# Patient Record
Sex: Female | Born: 1970 | Race: White | Hispanic: No | Marital: Single | Smoking: Former smoker
Health system: Southern US, Community
[De-identification: ages and names within clinical notes are randomized; demographics above are authoritative.]

## PROBLEM LIST (undated history)

## (undated) DIAGNOSIS — F419 Anxiety disorder, unspecified: Secondary | ICD-10-CM

## (undated) DIAGNOSIS — F329 Major depressive disorder, single episode, unspecified: Secondary | ICD-10-CM

## (undated) DIAGNOSIS — J40 Bronchitis, not specified as acute or chronic: Secondary | ICD-10-CM

## (undated) DIAGNOSIS — J449 Chronic obstructive pulmonary disease, unspecified: Secondary | ICD-10-CM

## (undated) DIAGNOSIS — J45909 Unspecified asthma, uncomplicated: Secondary | ICD-10-CM

## (undated) DIAGNOSIS — F32A Depression, unspecified: Secondary | ICD-10-CM

## (undated) DIAGNOSIS — D649 Anemia, unspecified: Secondary | ICD-10-CM

## (undated) HISTORY — DX: Major depressive disorder, single episode, unspecified: F32.9

## (undated) HISTORY — PX: CHOLECYSTECTOMY: SHX55

## (undated) HISTORY — DX: Anxiety disorder, unspecified: F41.9

## (undated) HISTORY — PX: TUBAL LIGATION: SHX77

## (undated) HISTORY — PX: TONSILLECTOMY: SUR1361

## (undated) HISTORY — DX: Anemia, unspecified: D64.9

## (undated) HISTORY — DX: Depression, unspecified: F32.A

## (undated) HISTORY — DX: Chronic obstructive pulmonary disease, unspecified: J44.9

---

## 2006-09-20 ENCOUNTER — Emergency Department: Payer: Self-pay | Admitting: Emergency Medicine

## 2007-08-28 ENCOUNTER — Emergency Department: Payer: Self-pay | Admitting: Emergency Medicine

## 2007-09-09 ENCOUNTER — Other Ambulatory Visit: Payer: Self-pay

## 2007-09-09 ENCOUNTER — Emergency Department: Payer: Self-pay | Admitting: Emergency Medicine

## 2008-04-13 ENCOUNTER — Emergency Department: Payer: Self-pay | Admitting: Emergency Medicine

## 2008-07-27 ENCOUNTER — Emergency Department: Payer: Self-pay | Admitting: Emergency Medicine

## 2008-08-12 ENCOUNTER — Emergency Department: Payer: Self-pay | Admitting: Unknown Physician Specialty

## 2008-12-12 ENCOUNTER — Emergency Department: Payer: Self-pay | Admitting: Emergency Medicine

## 2009-02-19 ENCOUNTER — Emergency Department: Payer: Self-pay | Admitting: Internal Medicine

## 2009-02-27 ENCOUNTER — Emergency Department: Payer: Self-pay | Admitting: Emergency Medicine

## 2009-06-23 ENCOUNTER — Emergency Department: Payer: Self-pay | Admitting: Emergency Medicine

## 2009-08-14 ENCOUNTER — Emergency Department: Payer: Self-pay | Admitting: Emergency Medicine

## 2010-08-17 ENCOUNTER — Emergency Department: Payer: Self-pay | Admitting: Emergency Medicine

## 2011-01-09 ENCOUNTER — Emergency Department: Payer: Self-pay | Admitting: Emergency Medicine

## 2011-07-28 ENCOUNTER — Emergency Department: Payer: Self-pay | Admitting: Emergency Medicine

## 2011-07-28 LAB — URINALYSIS, COMPLETE
Nitrite: NEGATIVE
Protein: NEGATIVE
Specific Gravity: 1.003 (ref 1.003–1.030)
WBC UR: 1 /HPF (ref 0–5)

## 2011-07-28 LAB — CBC WITH DIFFERENTIAL/PLATELET
Basophil #: 0 10*3/uL (ref 0.0–0.1)
Basophil %: 0.5 %
Eosinophil #: 0.2 10*3/uL (ref 0.0–0.7)
Lymphocyte #: 2.7 10*3/uL (ref 1.0–3.6)
MCHC: 32.8 g/dL (ref 32.0–36.0)
MCV: 92 fL (ref 80–100)
RBC: 4.23 10*6/uL (ref 3.80–5.20)
RDW: 14.7 % — ABNORMAL HIGH (ref 11.5–14.5)
WBC: 8.4 10*3/uL (ref 3.6–11.0)

## 2011-07-28 LAB — PROTIME-INR
INR: 1
Prothrombin Time: 13.2 secs (ref 11.5–14.7)

## 2011-07-28 LAB — BASIC METABOLIC PANEL
Anion Gap: 8 (ref 7–16)
BUN: 13 mg/dL (ref 7–18)
Calcium, Total: 9 mg/dL (ref 8.5–10.1)
Chloride: 105 mmol/L (ref 98–107)
Creatinine: 0.75 mg/dL (ref 0.60–1.30)
EGFR (African American): >60
EGFR (Non-African Amer.): >60

## 2011-12-22 ENCOUNTER — Emergency Department: Payer: Self-pay | Admitting: Emergency Medicine

## 2012-08-31 ENCOUNTER — Ambulatory Visit: Payer: Self-pay

## 2012-09-23 ENCOUNTER — Emergency Department: Payer: Self-pay | Admitting: Emergency Medicine

## 2012-11-16 ENCOUNTER — Ambulatory Visit: Payer: Self-pay

## 2013-06-27 ENCOUNTER — Emergency Department: Payer: Self-pay | Admitting: Emergency Medicine

## 2013-09-24 ENCOUNTER — Emergency Department: Payer: Self-pay | Admitting: Emergency Medicine

## 2014-02-06 ENCOUNTER — Emergency Department: Payer: Self-pay | Admitting: Internal Medicine

## 2014-02-06 LAB — COMPREHENSIVE METABOLIC PANEL
Albumin: 4 g/dL (ref 3.4–5.0)
Alkaline Phosphatase: 81 U/L
Anion Gap: 5 — ABNORMAL LOW (ref 7–16)
BILIRUBIN TOTAL: 0.3 mg/dL (ref 0.2–1.0)
BUN: 6 mg/dL — AB (ref 7–18)
CO2: 26 mmol/L (ref 21–32)
CREATININE: 0.86 mg/dL (ref 0.60–1.30)
Calcium, Total: 8.9 mg/dL (ref 8.5–10.1)
Chloride: 108 mmol/L — ABNORMAL HIGH (ref 98–107)
EGFR (African American): >60
EGFR (Non-African Amer.): >60
Glucose: 101 mg/dL — ABNORMAL HIGH (ref 65–99)
Osmolality: 275 (ref 275–301)
POTASSIUM: 3.5 mmol/L (ref 3.5–5.1)
SGOT(AST): 12 U/L — ABNORMAL LOW (ref 15–37)
SGPT (ALT): 14 U/L
SODIUM: 139 mmol/L (ref 136–145)
Total Protein: 7.5 g/dL (ref 6.4–8.2)

## 2014-02-06 LAB — URINALYSIS, COMPLETE
Bilirubin,UR: NEGATIVE
GLUCOSE, UR: NEGATIVE mg/dL (ref 0–75)
Ketone: NEGATIVE
NITRITE: NEGATIVE
PH: 7 (ref 4.5–8.0)
PROTEIN: NEGATIVE
RBC,UR: 18 /HPF (ref 0–5)
SPECIFIC GRAVITY: 1.019 (ref 1.003–1.030)
Squamous Epithelial: 7

## 2014-02-06 LAB — CBC WITH DIFFERENTIAL/PLATELET
Basophil #: 0.1 10*3/uL (ref 0.0–0.1)
Basophil %: 0.5 %
EOS ABS: 0 10*3/uL (ref 0.0–0.7)
EOS PCT: 0.4 %
HCT: 44.7 % (ref 35.0–47.0)
HGB: 15.1 g/dL (ref 12.0–16.0)
LYMPHS ABS: 2.3 10*3/uL (ref 1.0–3.6)
Lymphocyte %: 19.5 %
MCH: 32.6 pg (ref 26.0–34.0)
MCHC: 33.9 g/dL (ref 32.0–36.0)
MCV: 96 fL (ref 80–100)
MONOS PCT: 5.7 %
Monocyte #: 0.7 x10 3/mm (ref 0.2–0.9)
NEUTROS ABS: 8.9 10*3/uL — AB (ref 1.4–6.5)
Neutrophil %: 73.9 %
Platelet: 228 10*3/uL (ref 150–440)
RBC: 4.65 10*6/uL (ref 3.80–5.20)
RDW: 13.6 % (ref 11.5–14.5)
WBC: 12 10*3/uL — AB (ref 3.6–11.0)

## 2014-02-08 LAB — URINE CULTURE

## 2014-07-05 ENCOUNTER — Encounter (HOSPITAL_COMMUNITY): Payer: Self-pay

## 2014-07-05 ENCOUNTER — Observation Stay (HOSPITAL_COMMUNITY)
Admission: EM | Admit: 2014-07-05 | Discharge: 2014-07-06 | Disposition: A | Discharge status: Home / Self Care | Payer: Self-pay | Attending: Internal Medicine | Admitting: Internal Medicine

## 2014-07-05 ENCOUNTER — Emergency Department (HOSPITAL_COMMUNITY): Payer: Self-pay

## 2014-07-05 DIAGNOSIS — R079 Chest pain, unspecified: Principal | ICD-10-CM | POA: Insufficient documentation

## 2014-07-05 DIAGNOSIS — R7989 Other specified abnormal findings of blood chemistry: Secondary | ICD-10-CM | POA: Insufficient documentation

## 2014-07-05 DIAGNOSIS — F17210 Nicotine dependence, cigarettes, uncomplicated: Secondary | ICD-10-CM | POA: Insufficient documentation

## 2014-07-05 DIAGNOSIS — R61 Generalized hyperhidrosis: Secondary | ICD-10-CM | POA: Insufficient documentation

## 2014-07-05 LAB — CBC WITH DIFFERENTIAL/PLATELET
BASOS ABS: 0 10*3/uL (ref 0.0–0.1)
Basophils Relative: 0 % (ref 0–1)
EOS ABS: 0.1 10*3/uL (ref 0.0–0.7)
EOS PCT: 1 % (ref 0–5)
HCT: 38.5 % (ref 36.0–46.0)
Hemoglobin: 13.2 g/dL (ref 12.0–15.0)
LYMPHS PCT: 31 % (ref 12–46)
Lymphs Abs: 3.2 10*3/uL (ref 0.7–4.0)
MCH: 31.4 pg (ref 26.0–34.0)
MCHC: 34.3 g/dL (ref 30.0–36.0)
MCV: 91.4 fL (ref 78.0–100.0)
Monocytes Absolute: 1 10*3/uL (ref 0.1–1.0)
Monocytes Relative: 9 % (ref 3–12)
Neutro Abs: 6 10*3/uL (ref 1.7–7.7)
Neutrophils Relative %: 59 % (ref 43–77)
Platelets: 211 10*3/uL (ref 150–400)
RBC: 4.21 MIL/uL (ref 3.87–5.11)
RDW: 13.9 % (ref 11.5–15.5)
WBC: 10.2 10*3/uL (ref 4.0–10.5)

## 2014-07-05 LAB — I-STAT TROPONIN, ED: Troponin i, poc: 0 ng/mL (ref 0.00–0.08)

## 2014-07-05 NOTE — ED Provider Notes (Signed)
CSN: [id number]     Arrival date & time 07/05/14  2222 History   First MD Initiated Contact with Patient 07/05/14 2224     No chief complaint on file.  Melissa Dean is a 44 y.o. female who is a smoker who presents to the emergency department complaining of substernal and left-sided chest pain that radiates to her left shoulder blade beginning at 9 PM tonight. She reports gradual onset of pain and then worsened to being 7 out of 10 at its worst. She reports her pain as a pressure and sharp. Patient is unable to identify aggravating or alleviating factors. EMS did 3 rounds of nitroglycerin that decreased her pain to 2/10. The patient reports feeling fatigued. Currently the patient reports her pain is 2 out of 10 and describes it as a dull pain. She reports a similar substernal chest and radiates to her left shoulder. Patient reports she has been very stressed and somewhat anxious recently. She reports her appetite has been decreased and she has not been eating well. The patient denies history of MI. She reports father had an MI at age 53. The patient denies fevers, shortness of breath, palpitations, cough, hemoptysis, recent surgery, acid reflux, abdominal pain, vomiting, leg pain or leg swelling. The patient denies personal or family history of DVTs or PEs. The patient denies personal or family history of blood clotting disorders such as factor V Leiden, protein C or S deficiency. The patient received 325 ASA prior to arrival.   (Consider location/radiation/quality/duration/timing/severity/associated sxs/prior Treatment) HPI  History reviewed. No pertinent past medical history. Past Surgical History  Procedure Laterality Date  . Cholecystectomy    . Cesarean section     History reviewed. No pertinent family history. History  Substance Use Topics  . Smoking status: Current Every Day Smoker -- 0.50 packs/day  . Smokeless tobacco: Not on file  . Alcohol Use: Yes   OB History    No data  available     Review of Systems  Constitutional: Positive for appetite change. Negative for fever and chills.  HENT: Negative for congestion, ear pain and sore throat.   Eyes: Negative for pain and visual disturbance.  Respiratory: Negative for cough, chest tightness, shortness of breath and wheezing.   Cardiovascular: Positive for chest pain. Negative for palpitations and leg swelling.  Gastrointestinal: Negative for nausea, vomiting, abdominal pain and diarrhea.  Genitourinary: Negative for dysuria and hematuria.  Musculoskeletal: Positive for back pain. Negative for neck pain.  Skin: Negative for rash.  Neurological: Negative for weakness, numbness and headaches.  Psychiatric/Behavioral: The patient is nervous/anxious.       Allergies  Review of patient's allergies indicates not on file.  Home Medications   Prior to Admission medications   Not on File   BP 108/62 mmHg  Pulse 67  Resp 17  SpO2 99%  LMP 06/28/2014 Physical Exam  Constitutional: She is oriented to person, place, and time. She appears well-developed and well-nourished. No distress.  Nontoxic appearing. The patient does appear slightly anxious.  HENT:  Head: Normocephalic and atraumatic.  Right Ear: External ear normal.  Mouth/Throat: Oropharynx is clear and moist. No oropharyngeal exudate.  Eyes: Conjunctivae are normal. Pupils are equal, round, and reactive to light. Right eye exhibits no discharge. Left eye exhibits no discharge.  Neck: Neck supple. No JVD present. No tracheal deviation present.  Cardiovascular: Normal rate, regular rhythm, normal heart sounds and intact distal pulses.  Exam reveals no gallop and no friction rub.   No  murmur heard. Bilateral radial, posterior tibialis and dorsalis pedis pulses are intact.   Pulmonary/Chest: Effort normal and breath sounds normal. No respiratory distress. She has no wheezes. She has no rales. She exhibits tenderness.  Left chest tenderness to palpation  that reproduces her chest pain.   Abdominal: Soft. She exhibits no distension. There is no tenderness.  Musculoskeletal: She exhibits no edema or tenderness.  No lower extremity edema or tenderness.  Lymphadenopathy:    She has no cervical adenopathy.  Neurological: She is alert and oriented to person, place, and time. Coordination normal.  Skin: Skin is warm and dry. No rash noted. She is not diaphoretic. No erythema. No pallor.  Psychiatric: She has a normal mood and affect. Her behavior is normal.  Nursing note and vitals reviewed.   ED Course  Procedures (including critical care time) Labs Review Labs Reviewed  BASIC METABOLIC PANEL - Abnormal; Notable for the following:    GFR calc non Af Amer 83 (*)    All other components within normal limits  CBC WITH DIFFERENTIAL/PLATELET  Rosezena SensorI-STAT TROPOININ, ED    Imaging Review Dg Chest 2 View  07/06/2014   CLINICAL DATA:  Central chest pain radiating to back beginning at 9 p.m. this evening. Smoker.  EXAM: CHEST  2 VIEW  COMPARISON:  None.  FINDINGS: Cardiomediastinal silhouette is unremarkable. The lungs are clear without pleural effusions or focal consolidations. Trachea projects midline and there is no pneumothorax. Soft tissue planes and included osseous structures are non-suspicious. Surgical clips in the included right abdomen likely reflect cholecystectomy.  IMPRESSION: Normal chest.   Electronically Signed   By: Awilda Metroourtnay  Bloomer   On: 07/06/2014 00:21     EKG Interpretation None     EKG would not cross over in MUSE. Please see Dr. Darlyn ChamberZackowski's note for his interpretation.   Filed Vitals:   07/05/14 2245 07/05/14 2252 07/05/14 2300  BP: 107/64 107/64 108/62  Pulse: 67 75 67  Resp: 12 20 17   SpO2: 98% 98% 99%     MDM   Meds given in ED:  Medications - No data to display  New Prescriptions   No medications on file    Final diagnoses:  Chest pain, unspecified chest pain type   This is a 44 y.o. female who is a  smoker who presents to the emergency department complaining of substernal and left-sided chest pain that radiates to her left shoulder blade beginning at 9 PM tonight. She reports gradual onset of pain and then worsened to being 7 out of 10 at its worst. She reports her pain as a pressure and sharp. Patient is unable to identify aggravating or alleviating factors. EMS did 3 rounds of nitroglycerin that decreased her pain to 2/10. She had 325 ASA prior to arrival. She denies shortness of breath or palpitations.   Initial troponin is negative. BMP and CBC are unremarkable. Chest x-ray is negative.  I discussed this patient with my attending Dr. Rhunette CroftNanavati who feels like this patient would benefit from admission for ACS rule out. The patient is in agreement with admission. I consulted Dr. Alvester MorinNewton who accepts this patient for admission for ACS rule out.  This patient was discussed with and evaluated by Dr. Rhunette CroftNanavati who agrees with assessment and plan.     Everlene FarrierWilliam Francely Craw, PA-C 07/06/14 [id number]  Derwood KaplanAnkit Nanavati, MD 07/06/14 [id number]

## 2014-07-05 NOTE — ED Provider Notes (Signed)
ED ECG REPORT   Date: 07/05/2014  Rate: 63  Rhythm: normal sinus rhythm  QRS Axis: normal  Intervals: normal  ST/T Wave abnormalities: ST depressions inferiorly  Conduction Disutrbances:none  Narrative Interpretation:   Old EKG Reviewed: none available  I have personally reviewed the EKG tracing and agree with the computerized printout as noted.  EKG did not crossover into Muse.  Vanetta MuldersScott Nisa Decaire, MD 07/05/14 2242

## 2014-07-05 NOTE — ED Notes (Signed)
Per EMS: Pt began experiencing chest pain ~9pm with radiation to back. EMS gave 3 Nitro en route, pt reports having taken 325 ASA. 20G RFA. Smokes 1/5 PPD. Pain @2 /10 upon arrival.

## 2014-07-06 DIAGNOSIS — R079 Chest pain, unspecified: Secondary | ICD-10-CM | POA: Diagnosis present

## 2014-07-06 DIAGNOSIS — Z720 Tobacco use: Secondary | ICD-10-CM

## 2014-07-06 LAB — TSH: TSH: 5.577 u[IU]/mL — ABNORMAL HIGH (ref 0.350–4.500)

## 2014-07-06 LAB — CBC WITH DIFFERENTIAL/PLATELET
Basophils Absolute: 0 10*3/uL (ref 0.0–0.1)
Basophils Relative: 1 % (ref 0–1)
Eosinophils Absolute: 0.1 10*3/uL (ref 0.0–0.7)
Eosinophils Relative: 2 % (ref 0–5)
HCT: 37.7 % (ref 36.0–46.0)
Hemoglobin: 13 g/dL (ref 12.0–15.0)
LYMPHS ABS: 4.1 10*3/uL — AB (ref 0.7–4.0)
LYMPHS PCT: 46 % (ref 12–46)
MCH: 31.6 pg (ref 26.0–34.0)
MCHC: 34.5 g/dL (ref 30.0–36.0)
MCV: 91.5 fL (ref 78.0–100.0)
Monocytes Absolute: 0.8 10*3/uL (ref 0.1–1.0)
Monocytes Relative: 9 % (ref 3–12)
NEUTROS PCT: 42 % — AB (ref 43–77)
Neutro Abs: 3.7 10*3/uL (ref 1.7–7.7)
PLATELETS: 217 10*3/uL (ref 150–400)
RBC: 4.12 MIL/uL (ref 3.87–5.11)
RDW: 13.9 % (ref 11.5–15.5)
WBC: 8.7 10*3/uL (ref 4.0–10.5)

## 2014-07-06 LAB — COMPREHENSIVE METABOLIC PANEL
ALBUMIN: 3.6 g/dL (ref 3.5–5.2)
ALK PHOS: 62 U/L (ref 39–117)
ALT: 10 U/L (ref 0–35)
AST: 16 U/L (ref 0–37)
Anion gap: 8 (ref 5–15)
BUN: 6 mg/dL (ref 6–23)
CO2: 28 mmol/L (ref 19–32)
Calcium: 9 mg/dL (ref 8.4–10.5)
Chloride: 102 mmol/L (ref 96–112)
Creatinine, Ser: 0.64 mg/dL (ref 0.50–1.10)
GFR calc Af Amer: >90 mL/min (ref >90)
GFR calc non Af Amer: >90 mL/min (ref >90)
Glucose, Bld: 95 mg/dL (ref 70–99)
POTASSIUM: 3.4 mmol/L — AB (ref 3.5–5.1)
SODIUM: 138 mmol/L (ref 135–145)
Total Bilirubin: 0.5 mg/dL (ref 0.3–1.2)
Total Protein: 5.9 g/dL — ABNORMAL LOW (ref 6.0–8.3)

## 2014-07-06 LAB — CREATININE, SERUM
Creatinine, Ser: 0.84 mg/dL (ref 0.50–1.10)
GFR, EST NON AFRICAN AMERICAN: 85 mL/min — AB (ref >90)

## 2014-07-06 LAB — BASIC METABOLIC PANEL
ANION GAP: 11 (ref 5–15)
BUN: 7 mg/dL (ref 6–23)
CO2: 24 mmol/L (ref 19–32)
Calcium: 9.4 mg/dL (ref 8.4–10.5)
Chloride: 104 mmol/L (ref 96–112)
Creatinine, Ser: 0.86 mg/dL (ref 0.50–1.10)
GFR calc Af Amer: >90 mL/min (ref >90)
GFR calc non Af Amer: 83 mL/min — ABNORMAL LOW (ref >90)
Glucose, Bld: 98 mg/dL (ref 70–99)
Potassium: 3.9 mmol/L (ref 3.5–5.1)
SODIUM: 139 mmol/L (ref 135–145)

## 2014-07-06 LAB — LIPASE, BLOOD: Lipase: 33 U/L (ref 11–59)

## 2014-07-06 LAB — TROPONIN I
Troponin I: <0.03 ng/mL (ref <0.031)
Troponin I: <0.03 ng/mL (ref <0.031)

## 2014-07-06 LAB — RAPID URINE DRUG SCREEN, HOSP PERFORMED
Amphetamines: NOT DETECTED
BARBITURATES: NOT DETECTED
Benzodiazepines: NOT DETECTED
Cocaine: NOT DETECTED
OPIATES: POSITIVE — AB
Tetrahydrocannabinol: POSITIVE — AB

## 2014-07-06 LAB — CBC
HEMATOCRIT: 38.5 % (ref 36.0–46.0)
Hemoglobin: 13.1 g/dL (ref 12.0–15.0)
MCH: 31.1 pg (ref 26.0–34.0)
MCHC: 34 g/dL (ref 30.0–36.0)
MCV: 91.4 fL (ref 78.0–100.0)
Platelets: 227 10*3/uL (ref 150–400)
RBC: 4.21 MIL/uL (ref 3.87–5.11)
RDW: 13.9 % (ref 11.5–15.5)
WBC: 9.8 10*3/uL (ref 4.0–10.5)

## 2014-07-06 LAB — LIPID PANEL
Cholesterol: 134 mg/dL (ref 0–200)
HDL: 38 mg/dL — AB (ref >39)
LDL CALC: 76 mg/dL (ref 0–99)
Total CHOL/HDL Ratio: 3.5 RATIO
Triglycerides: 102 mg/dL (ref <150)
VLDL: 20 mg/dL (ref 0–40)

## 2014-07-06 LAB — D-DIMER, QUANTITATIVE: D-Dimer, Quant: <0.27 ug/mL-FEU (ref 0.00–0.48)

## 2014-07-06 MED ORDER — ASPIRIN EC 325 MG PO TBEC
325.0000 mg | DELAYED_RELEASE_TABLET | Freq: Every day | ORAL | Status: DC
  Administered 2014-07-06: 325 mg via ORAL
  Filled 2014-07-06: qty 1

## 2014-07-06 MED ORDER — NITROGLYCERIN 0.4 MG SL SUBL
0.4000 mg | SUBLINGUAL_TABLET | SUBLINGUAL | Status: DC | PRN
Start: 2014-07-06 — End: 2014-07-06

## 2014-07-06 MED ORDER — MORPHINE SULFATE 2 MG/ML IJ SOLN
1.0000 mg | INTRAMUSCULAR | Status: DC | PRN
  Administered 2014-07-06 (×2): 2 mg via INTRAVENOUS
  Filled 2014-07-06 (×3): qty 1

## 2014-07-06 MED ORDER — HEPARIN SODIUM (PORCINE) 5000 UNIT/ML IJ SOLN
5000.0000 [IU] | Freq: Three times a day (TID) | INTRAMUSCULAR | Status: DC
  Administered 2014-07-06 (×3): 5000 [IU] via SUBCUTANEOUS
  Filled 2014-07-06 (×5): qty 1

## 2014-07-06 MED ORDER — SODIUM CHLORIDE 0.9 % IJ SOLN
3.0000 mL | Freq: Two times a day (BID) | INTRAMUSCULAR | Status: DC
  Administered 2014-07-06 (×2): 3 mL via INTRAVENOUS

## 2014-07-06 NOTE — Discharge Summary (Signed)
Discharge Summary  Melissa JohnsValerie Dean GEX:[id number] DOB: 28-Jun-1970  PCP: No primary care provider on file.  Admit date: 07/05/2014 Discharge date: 07/06/2014  Time spent: <7530mins  Recommendations for Outpatient Follow-up:  -F/u with [facility],   patient is to go to the wellness center on Monday to follow up on all tests result including thyroid function/echocardiogram result. Patient to also instructed to discuss smoking cessation with community wellness center.   Discharge Diagnoses:  Active Hospital Problems   Diagnosis Date Noted  . Chest pain 07/06/2014    Resolved Hospital Problems   Diagnosis Date Noted Date Resolved  No resolved problems to display.    Discharge Condition: stable  Diet recommendation: heart healthy  There were no vitals filed for this visit.  History of present illness:  This is a 44 y.o. year old female with significant past medical history of tobacco abuse presenting with chest pain. Pt reports sudden onset of central chest pain earlier this evening. Pain 8/10. Consistent. Some radiation to back and L shoulder. Mild assd diaphoresis. Pain occurred at rest. Denies any prior hx/o heart disease or similar sxs in the past. Active smoker. Denies any illicit drug use. No known hx/o medical issues. + family hx/o heart disease in dad w/ mild MI in early 4450s. Called EMS at home. Pain not improved until after 3 sublingual NTG.  Presented to ER afebrile, hemodynamically stable. CBC and CMET WNL. Trop neg x1. EKG w/ ? t wave abnormalities laterally, though formal EKG not available. CXR WNL.   Hospital Course:  Active Problems:   Chest pain  Cheat pain: likely atypical. She does has risk factor including smoking (1pack a day), and premature CAD in father ( who was a smoker). Three set of cardiac enzyme negative. ldl 76. ddimer wnl. Sinus brady heart rate of 59, occasional sinus arrhythmia. No st elevation. Does has mild tender on  palpation to anterior chest wall. Pain subsided at time of discharge. She is discharged home with community wellness center follow up on Monday.  Elevated tsh, free t3/free t4 pending, patient is to follow up result on Monday with community [facility] at [facility]. She is aware , if test showed she has hypothyroidism, she is likely be started on thyroid supplement.  Smoking cessation education, patient is contemplating quit smoking, will discuss with community wellness center and likely will choose nicotine patch.  Urine drug test: positive with THC. Discussed with patient as well.    Procedures:  echo  Consultations:  none  Discharge Exam: BP 98/50 mmHg  Pulse 59  Temp(Src) 98 F (36.7 C) (Oral)  Resp 18  SpO2 97%  LMP 06/28/2014  General: aaox3 Cardiovascular: rrr Respiratory: CTABl Ab: soft/NT/ND, positive BS. Extremity: no edema  Discharge Instructions You were cared for by a hospitalist during your hospital stay. If you have any questions about your discharge medications or the care you received while you were in the hospital after you are discharged, you can call the unit and asked to speak with the hospitalist on call if the hospitalist that took care of you is not available. Once you are discharged, your primary care physician will handle any further medical issues. Please note that NO REFILLS for any discharge medications will be authorized once you are discharged, as it is imperative that you return to your primary care physician (or establish a relationship with a primary care physician if you do not have one) for your aftercare needs so that they can reassess  your need for medications and monitor your lab values.  Discharge Instructions    Diet - low sodium heart healthy    Complete by:  As directed      Increase activity slowly    Complete by:  As directed             Medication List    Notice    You have not been prescribed any medications.     No  Known Allergies     Follow-up Information    Follow up with [facility]     In 1 week.   Contact information:   [address] [place] [address] [phone]       The results of significant diagnostics from this hospitalization (including imaging, microbiology, ancillary and laboratory) are listed below for reference.    Significant Diagnostic Studies: Dg Chest 2 View  07/06/2014   CLINICAL DATA:  Central chest pain radiating to back beginning at 9 p.m. this evening. Smoker.  EXAM: CHEST  2 VIEW  COMPARISON:  None.  FINDINGS: Cardiomediastinal silhouette is unremarkable. The lungs are clear without pleural effusions or focal consolidations. Trachea projects midline and there is no pneumothorax. Soft tissue planes and included osseous structures are non-suspicious. Surgical clips in the included right abdomen likely reflect cholecystectomy.  IMPRESSION: Normal chest.   Electronically Signed   By: Awilda Metro   On: 07/06/2014 00:21    Microbiology: No results found for this or any previous visit (from the past 240 hour(s)).   Labs: Basic Metabolic Panel:  Recent Labs Lab 07/05/14 2314 07/06/14 0207 07/06/14 0422  NA 139  --  138  K 3.9  --  3.4*  CL 104  --  102  CO2 24  --  28  GLUCOSE 98  --  95  BUN 7  --  6  CREATININE 0.86 0.84 0.64  CALCIUM 9.4  --  9.0   Liver Function Tests:  Recent Labs Lab 07/06/14 0422  AST 16  ALT 10  ALKPHOS 62  BILITOT 0.5  PROT 5.9*  ALBUMIN 3.6   No results for input(s): LIPASE, AMYLASE in the last 168 hours. No results for input(s): AMMONIA in the last 168 hours. CBC:  Recent Labs Lab 07/05/14 2314 07/06/14 0207 07/06/14 0422  WBC 10.2 9.8 8.7  NEUTROABS 6.0  --  3.7  HGB 13.2 13.1 13.0  HCT 38.5 38.5 37.7  MCV 91.4 91.4 91.5  PLT 211 227 217   Cardiac Enzymes:  Recent Labs Lab 07/06/14 0207 07/06/14 0726 07/06/14 1325  TROPONINI <0.03 <0.03 <0.03    BNP: BNP (last 3 results) No results for input(s): BNP in the last 8760 hours.  ProBNP (last 3 results) No results for input(s): PROBNP in the last 8760 hours.  CBG: No results for input(s): GLUCAP in the last 168 hours.     SignedAlbertine Grates MD, PhD  [facility] 07/06/2014, 4:25 PM

## 2014-07-06 NOTE — Progress Notes (Signed)
Pt discharged via W/C, condition stable, educated to follow up with community health on Monday, verbalized understanding, accompanied by significant other.

## 2014-07-06 NOTE — Progress Notes (Signed)
  Echocardiogram 2D Echocardiogram has been performed.  Delcie RochENNINGTON, Carlon Chaloux 07/06/2014, 5:36 PM

## 2014-07-06 NOTE — Progress Notes (Signed)
UR completed 

## 2014-07-06 NOTE — H&P (Signed)
Hospitalist Admission History and Physical  Patient name: Melissa Dean Medical record number: [id number] Date of birth: 05/20/1970 Age: 44 y.o. Gender: female  Primary Care Provider: No primary care provider on file.  Chief Complaint: chest pain   History of Present Illness:This is a 44 y.o. year old female with significant past medical history of tobacco abuse presenting with chest pain. Pt reports sudden onset of central chest pain earlier this evening. Pain 8/10. Consistent. Some radiation to back and L shoulder. Mild assd diaphoresis. Pain occurred at rest. Denies any prior hx/o heart disease or similar sxs in the past. Active smoker. Denies any illicit drug use. No known hx/o medical issues. + family hx/o heart disease in dad w/ mild MI in early 7050s. Called EMS at home. Pain not improved until after 3 sublingual NTG.  Presented to ER afebrile, hemodynamically stable. CBC and CMET WNL. Trop neg x1. EKG w/ ? t wave abnormalities laterally, though formal EKG not available. CXR WNL.   HEART Score 3-4   Assessment and Plan: Melissa Dean is a 44 y.o. year old female presenting with chest pain   Active Problems:   Chest pain   1- Chest Pain  -fairly typical sxs in pt some cardiac RFs including tobacco abuse and family history  -trop neg x1 -EKG NSR w/ ? t wave changes laterally-repeat  -cycle CEs -full dose ASA -prn NTG -risk stratification labs -tele bed -follow  FEN/GI: heart healthy diet  Prophylaxis: sub q heparin  Disposition: pending further evaluation  Code Status:Full Code    Patient Active Problem List   Diagnosis Date Noted  . Chest pain 07/06/2014   Past Medical History: History reviewed. No pertinent past medical history.  Past Surgical History: Past Surgical History  Procedure Laterality Date  . Cholecystectomy    . Cesarean section      Social History: History   Social History  . Marital Status: Married    Spouse Name: N/A  . Number of  Children: N/A  . Years of Education: N/A   Social History Main Topics  . Smoking status: Current Every Day Smoker -- 0.50 packs/day  . Smokeless tobacco: Not on file  . Alcohol Use: Yes  . Drug Use: No  . Sexual Activity: Not on file   Other Topics Concern  . None   Social History Narrative  . None    Family History: History reviewed. No pertinent family history.  Allergies: Not on File  Current Facility-Administered Medications  Medication Dose Route Frequency Provider Last Rate Last Dose  . aspirin EC tablet 325 mg  325 mg Oral Daily Floydene FlockSteven J Latanja Lehenbauer, MD      . heparin injection 5,000 Units  5,000 Units Subcutaneous 3 times per day Floydene FlockSteven J Livvy Spilman, MD      . morphine 2 MG/ML injection 1-2 mg  1-2 mg Intravenous Q3H PRN Floydene FlockSteven J Nyna Chilton, MD      . sodium chloride 0.9 % injection 3 mL  3 mL Intravenous Q12H Floydene FlockSteven J Alinna Siple, MD       No current outpatient prescriptions on file.   Review Of Systems: 12 point ROS negative except as noted above in HPI.  Physical Exam: Filed Vitals:   07/05/14 2300  BP: 108/62  Pulse: 67  Resp: 17    General: alert and cooperative HEENT: PERRLA and extra ocular movement intact Heart: S1, S2 normal, no murmur, rub or gallop, regular rate and rhythm Lungs: clear to auscultation, no wheezes or rales and unlabored  breathing Abdomen: abdomen is soft without significant tenderness, masses, organomegaly or guarding Extremities: extremities normal, atraumatic, no cyanosis or edema Skin:no rashes Neurology: normal without focal findings  Labs and Imaging: Lab Results  Component Value Date/Time   NA 139 07/05/2014 11:14 PM   K 3.9 07/05/2014 11:14 PM   CL 104 07/05/2014 11:14 PM   CO2 24 07/05/2014 11:14 PM   BUN 7 07/05/2014 11:14 PM   CREATININE 0.86 07/05/2014 11:14 PM   GLUCOSE 98 07/05/2014 11:14 PM   Lab Results  Component Value Date   WBC 10.2 07/05/2014   HGB 13.2 07/05/2014   HCT 38.5 07/05/2014   MCV 91.4 07/05/2014   PLT  211 07/05/2014    Dg Chest 2 View  07/06/2014   CLINICAL DATA:  Central chest pain radiating to back beginning at 9 p.m. this evening. Smoker.  EXAM: CHEST  2 VIEW  COMPARISON:  None.  FINDINGS: Cardiomediastinal silhouette is unremarkable. The lungs are clear without pleural effusions or focal consolidations. Trachea projects midline and there is no pneumothorax. Soft tissue planes and included osseous structures are non-suspicious. Surgical clips in the included right abdomen likely reflect cholecystectomy.  IMPRESSION: Normal chest.   Electronically Signed   By: Awilda Metro   On: 07/06/2014 00:21           Doree Albee MD  Pager: [phone]

## 2014-07-07 LAB — HEMOGLOBIN A1C
Hgb A1c MFr Bld: 5.3 % (ref 4.8–5.6)
MEAN PLASMA GLUCOSE: 105 mg/dL

## 2014-07-07 LAB — T4, FREE: FREE T4: 1.18 ng/dL (ref 0.80–1.80)

## 2014-07-07 LAB — T3, FREE: T3, Free: 3.8 pg/mL (ref 2.0–4.4)

## 2014-07-18 ENCOUNTER — Ambulatory Visit: Payer: Self-pay | Attending: Internal Medicine | Admitting: Internal Medicine

## 2014-07-18 ENCOUNTER — Encounter: Payer: Self-pay | Admitting: Internal Medicine

## 2014-07-18 ENCOUNTER — Telehealth: Payer: Self-pay | Admitting: Internal Medicine

## 2014-07-18 VITALS — BP 122/80 | HR 85 | Temp 98.2°F | Resp 16 | Wt 126.4 lb

## 2014-07-18 DIAGNOSIS — F411 Generalized anxiety disorder: Secondary | ICD-10-CM | POA: Insufficient documentation

## 2014-07-18 DIAGNOSIS — Z720 Tobacco use: Secondary | ICD-10-CM | POA: Insufficient documentation

## 2014-07-18 DIAGNOSIS — Z139 Encounter for screening, unspecified: Secondary | ICD-10-CM | POA: Insufficient documentation

## 2014-07-18 DIAGNOSIS — Z1231 Encounter for screening mammogram for malignant neoplasm of breast: Secondary | ICD-10-CM

## 2014-07-18 DIAGNOSIS — R0789 Other chest pain: Secondary | ICD-10-CM | POA: Insufficient documentation

## 2014-07-18 DIAGNOSIS — F419 Anxiety disorder, unspecified: Secondary | ICD-10-CM | POA: Insufficient documentation

## 2014-07-18 DIAGNOSIS — E876 Hypokalemia: Secondary | ICD-10-CM

## 2014-07-18 DIAGNOSIS — R7989 Other specified abnormal findings of blood chemistry: Secondary | ICD-10-CM | POA: Insufficient documentation

## 2014-07-18 LAB — COMPLETE METABOLIC PANEL WITH GFR
ALT: <8 U/L (ref 0–35)
AST: 12 U/L (ref 0–37)
Albumin: 4.3 g/dL (ref 3.5–5.2)
Alkaline Phosphatase: 64 U/L (ref 39–117)
BILIRUBIN TOTAL: 0.6 mg/dL (ref 0.2–1.2)
BUN: 5 mg/dL — ABNORMAL LOW (ref 6–23)
CALCIUM: 9.8 mg/dL (ref 8.4–10.5)
CO2: 27 mEq/L (ref 19–32)
CREATININE: 0.81 mg/dL (ref 0.50–1.10)
Chloride: 108 mEq/L (ref 96–112)
GFR, Est African American: >89 mL/min
GFR, Est Non African American: >89 mL/min
Glucose, Bld: 92 mg/dL (ref 70–99)
Potassium: 5.2 mEq/L (ref 3.5–5.3)
Sodium: 142 mEq/L (ref 135–145)
Total Protein: 6.8 g/dL (ref 6.0–8.3)

## 2014-07-18 MED ORDER — CITALOPRAM HYDROBROMIDE 20 MG PO TABS: 20.0000 mg | ORAL_TABLET | Freq: Every day | ORAL | Status: DC

## 2014-07-18 MED ORDER — CITALOPRAM HYDROBROMIDE 10 MG PO TABS: 10.0000 mg | ORAL_TABLET | Freq: Every day | ORAL | Status: DC

## 2014-07-18 MED ORDER — NICOTINE 21 MG/24HR TD PT24: 21.0000 mg | MEDICATED_PATCH | Freq: Every day | TRANSDERMAL | Status: DC

## 2014-07-18 NOTE — Telephone Encounter (Signed)
Patient called stating that she used  To take Celexa 20mg , but was prescribed 10mg  and would like it changed to the mg she was taking before. Please f/u with pt.

## 2014-07-18 NOTE — Telephone Encounter (Signed)
Celexa 20 mg sent to the pharmacy

## 2014-07-18 NOTE — Progress Notes (Signed)
Patient Demographics  Melissa Dean, is a 44 y.o. female  WUJ:[id number]  NFA:[id number]  DOB - 1970/07/10  CC:  Chief Complaint  Patient presents with  . Establish Care       HPI: Melissa Dean is a 44 y.o. female here today to establish medical care.Patient has history of tobacco abuse, recently hospitalized with symptoms of chest pain, EMR reviewed patient had a cardiac workup done and it was thought to be chest pain likely atypical, her blood work reviewed patient had borderline abnormal TSH level, her free T4 and free T4 level is normal, patient also had echocardiogram done which reported EF of 60-65%, blood chemistry showed borderline low potassium level, she also reports history of anxiety, used to be on Celexa, recently she has been having lot of stress and anxiety symptoms, she is requesting prescription for this medication, denies any SI or HI. Patient also wants to quit smoking and is requesting prescription for nicotine patch. Patient has No headache, No chest pain, No abdominal pain - No Nausea, No new weakness tingling or numbness, No Cough - SOB.  No Known Allergies History reviewed. No pertinent past medical history. No current outpatient prescriptions on file prior to visit.   No current facility-administered medications on file prior to visit.   Family History  Problem Relation Age of Onset  . Heart disease Father   . Hypertension Father   . Diabetes Father   . Cancer Father     lung cancer  . Diabetes Brother   . Hypertension Paternal Grandfather   . Diabetes Paternal Grandfather   . Thyroid disease     History   Social History  . Marital Status: Married    Spouse Name: N/A  . Number of Children: N/A  . Years of Education: N/A   Occupational History  . Not on file.   Social History Main Topics  . Smoking status: Current Every Day Smoker -- 1.00 packs/day for 20 years  . Smokeless tobacco: Not on file  . Alcohol Use: 0.0 oz/week    0  Standard drinks or equivalent per week  . Drug Use: Yes  . Sexual Activity: Not on file   Other Topics Concern  . Not on file   Social History Narrative    Review of Systems: Constitutional: Negative for fever, chills, diaphoresis, activity change, appetite change and fatigue. HENT: Negative for ear pain, nosebleeds, congestion, facial swelling, rhinorrhea, neck pain, neck stiffness and ear discharge.  Eyes: Negative for pain, discharge, redness, itching and visual disturbance. Respiratory: Negative for cough, choking, chest tightness, shortness of breath, wheezing and stridor.  Cardiovascular: Negative for chest pain, palpitations and leg swelling. Gastrointestinal: Negative for abdominal distention. Genitourinary: Negative for dysuria, urgency, frequency, hematuria, flank pain, decreased urine volume, difficulty urinating and dyspareunia.  Musculoskeletal: Negative for back pain, joint swelling, arthralgia and gait problem. Neurological: Negative for dizziness, tremors, seizures, syncope, facial asymmetry, speech difficulty, weakness, light-headedness, numbness and headaches.  Hematological: Negative for adenopathy. Does not bruise/bleed easily. Psychiatric/Behavioral: Negative for hallucinations, behavioral problems, confusion, dysphoric mood, decreased concentration and agitation.    Objective:   Filed Vitals:   07/18/14 0916  BP: 122/80  Pulse: 85  Temp: 98.2 F (36.8 C)  Resp: 16    Physical Exam: Constitutional: Patient appears well-developed and well-nourished. No distress. HENT: Normocephalic, atraumatic, External right and left ear normal. Oropharynx is clear and moist.  Eyes: Conjunctivae and EOM are normal. PERRLA, no scleral icterus. Neck: Normal ROM. Neck supple.  No JVD. No tracheal deviation. No thyromegaly. CVS: RRR, S1/S2 +, no murmurs, no gallops, no carotid bruit.  Pulmonary: Effort and breath sounds normal, no stridor, rhonchi, wheezes, rales.  Abdominal:  Soft. BS +, no distension, tenderness, rebound or guarding.  Musculoskeletal: Normal range of motion. No edema and no tenderness.  Neuro: Alert. Normal reflexes, muscle tone coordination. No cranial nerve deficit. Skin: Skin is warm and dry. No rash noted. Not diaphoretic. No erythema. No pallor. Psychiatric: Normal mood and affect. Behavior, judgment, thought content normal.  Lab Results  Component Value Date   WBC 8.7 07/06/2014   HGB 13.0 07/06/2014   HCT 37.7 07/06/2014   MCV 91.5 07/06/2014   PLT 217 07/06/2014   Lab Results  Component Value Date   CREATININE 0.64 07/06/2014   BUN 6 07/06/2014   NA 138 07/06/2014   K 3.4* 07/06/2014   CL 102 07/06/2014   CO2 28 07/06/2014    Lab Results  Component Value Date/Time   HGBA1C 5.3 07/06/2014 02:07 AM   Lipid Panel     Component Value Date/Time   CHOL 134 07/06/2014 0207   TRIG 102 07/06/2014 0207   HDL 38* 07/06/2014 0207   CHOLHDL 3.5 07/06/2014 0207   VLDL 20 07/06/2014 0207   LDLCALC 76 07/06/2014 0207       Assessment and plan:   1. Atypical chest pain Patient symptomatically improved denies any chest pain.  2. Abnormal TSH Free T3-T4 level is normal, will repeat TSH level on the following visit.  3. Tobacco abuse Patient is given prescription for nicotine patch.  4. Hypokalemia Will repeat blood chemistry - COMPLETE METABOLIC PANEL WITH GFR  5. Anxiety state Resume back on Celexa.  6. Encounter for screening mammogram for breast cancer  - MM DIGITAL SCREENING BILATERAL; Future  7. Screening  - Vit D  25 hydroxy (rtn osteoporosis monitoring)     Health Maintenance  -Pap Smear: as per patient she had a Pap smear done within the last year and time. -Mammogram:ordered   Return in about 3 months (around 10/17/2014), or if symptoms worsen or fail to improve.    The patient was given clear instructions to go to ER or return to medical center if symptoms don't improve, worsen or new problems  develop. The patient verbalized understanding. The patient was told to call to get lab results if they haven't heard anything in the next week.    This note has been created with Education officer, environmentalDragon speech recognition software and smart phrase technology. Any transcriptional errors are unintentional.   Doris CheadleADVANI, Colinda Barth, MD

## 2014-07-18 NOTE — Progress Notes (Signed)
Patient here to establish care Recently admitted to [facility] for chest pains Patient states she was told her thyroid numbers were high Patient currently takes no prescribed medications

## 2014-07-18 NOTE — Patient Instructions (Signed)
Smoking Cessation Quitting smoking is important to your health and has many advantages. However, it is not always easy to quit since nicotine is a very addictive drug. Oftentimes, people try 3 times or more before being able to quit. This document explains the best ways for you to prepare to quit smoking. Quitting takes hard work and a lot of effort, but you can do it. ADVANTAGES OF QUITTING SMOKING  You will live longer, feel better, and live better.  Your body will feel the impact of quitting smoking almost immediately.  Within 20 minutes, blood pressure decreases. Your pulse returns to its normal level.  After 8 hours, carbon monoxide levels in the blood return to normal. Your oxygen level increases.  After 24 hours, the chance of having a heart attack starts to decrease. Your breath, hair, and body stop smelling like smoke.  After 48 hours, damaged nerve endings begin to recover. Your sense of taste and smell improve.  After 72 hours, the body is virtually free of nicotine. Your bronchial tubes relax and breathing becomes easier.  After 2 to 12 weeks, lungs can hold more air. Exercise becomes easier and circulation improves.  The risk of having a heart attack, stroke, cancer, or lung disease is greatly reduced.  After 1 year, the risk of coronary heart disease is cut in half.  After 5 years, the risk of stroke falls to the same as a nonsmoker.  After 10 years, the risk of lung cancer is cut in half and the risk of other cancers decreases significantly.  After 15 years, the risk of coronary heart disease drops, usually to the level of a nonsmoker.  If you are pregnant, quitting smoking will improve your chances of having a healthy baby.  The people you live with, especially any children, will be healthier.  You will have extra money to spend on things other than cigarettes. QUESTIONS TO THINK ABOUT BEFORE ATTEMPTING TO QUIT You may want to talk about your answers with your  health care provider.  Why do you want to quit?  If you tried to quit in the past, what helped and what did not?  What will be the most difficult situations for you after you quit? How will you plan to handle them?  Who can help you through the tough times? Your family? Friends? A health care provider?  What pleasures do you get from smoking? What ways can you still get pleasure if you quit? Here are some questions to ask your health care provider:  How can you help me to be successful at quitting?  What medicine do you think would be best for me and how should I take it?  What should I do if I need more help?  What is smoking withdrawal like? How can I get information on withdrawal? GET READY  Set a quit date.  Change your environment by getting rid of all cigarettes, ashtrays, matches, and lighters in your home, car, or work. Do not let people smoke in your home.  Review your past attempts to quit. Think about what worked and what did not. GET SUPPORT AND ENCOURAGEMENT You have a better chance of being successful if you have help. You can get support in many ways.  Tell your family, friends, and coworkers that you are going to quit and need their support. Ask them not to smoke around you.  Get individual, group, or telephone counseling and support. Programs are available at local hospitals and health centers. Call   your local health department for information about programs in your area.  Spiritual beliefs and practices may help some smokers quit.  Download a "quit meter" on your computer to keep track of quit statistics, such as how long you have gone without smoking, cigarettes not smoked, and money saved.  Get a self-help book about quitting smoking and staying off tobacco. LEARN NEW SKILLS AND BEHAVIORS  Distract yourself from urges to smoke. Talk to someone, go for a walk, or occupy your time with a task.  Change your normal routine. Take a different route to work.  Drink tea instead of coffee. Eat breakfast in a different place.  Reduce your stress. Take a hot bath, exercise, or read a book.  Plan something enjoyable to do every day. Reward yourself for not smoking.  Explore interactive web-based programs that specialize in helping you quit. GET MEDICINE AND USE IT CORRECTLY Medicines can help you stop smoking and decrease the urge to smoke. Combining medicine with the above behavioral methods and support can greatly increase your chances of successfully quitting smoking.  Nicotine replacement therapy helps deliver nicotine to your body without the negative effects and risks of smoking. Nicotine replacement therapy includes nicotine gum, lozenges, inhalers, nasal sprays, and skin patches. Some may be available over-the-counter and others require a prescription.  Antidepressant medicine helps people abstain from smoking, but how this works is unknown. This medicine is available by prescription.  Nicotinic receptor partial agonist medicine simulates the effect of nicotine in your brain. This medicine is available by prescription. Ask your health care provider for advice about which medicines to use and how to use them based on your health history. Your health care provider will tell you what side effects to look out for if you choose to be on a medicine or therapy. Carefully read the information on the package. Do not use any other product containing nicotine while using a nicotine replacement product.  RELAPSE OR DIFFICULT SITUATIONS Most relapses occur within the first 3 months after quitting. Do not be discouraged if you start smoking again. Remember, most people try several times before finally quitting. You may have symptoms of withdrawal because your body is used to nicotine. You may crave cigarettes, be irritable, feel very hungry, cough often, get headaches, or have difficulty concentrating. The withdrawal symptoms are only temporary. They are strongest  when you first quit, but they will go away within 10-14 days. To reduce the chances of relapse, try to:  Avoid drinking alcohol. Drinking lowers your chances of successfully quitting.  Reduce the amount of caffeine you consume. Once you quit smoking, the amount of caffeine in your body increases and can give you symptoms, such as a rapid heartbeat, sweating, and anxiety.  Avoid smokers because they can make you want to smoke.  Do not let weight gain distract you. Many smokers will gain weight when they quit, usually less than 10 pounds. Eat a healthy diet and stay active. You can always lose the weight gained after you quit.  Find ways to improve your mood other than smoking. FOR MORE INFORMATION  www.smokefree.gov  Document Released: 03/17/2001 Document Revised: 08/07/2013 Document Reviewed: 07/02/2011 Exit[employer] Patient Information 2015 [employer]. This information is not intended to replace advice given to you by your health care provider. Make sure you discuss any questions you have with your health care provider.  

## 2014-07-19 ENCOUNTER — Telehealth: Payer: Self-pay

## 2014-07-19 LAB — VITAMIN D 25 HYDROXY (VIT D DEFICIENCY, FRACTURES): Vit D, 25-Hydroxy: 26 ng/mL — ABNORMAL LOW (ref 30–100)

## 2014-07-19 NOTE — Telephone Encounter (Signed)
Patient is aware of her lab results 

## 2014-07-19 NOTE — Telephone Encounter (Signed)
-----   Message from Doris Cheadleeepak Advani, MD sent at 07/19/2014  9:22 AM EDT ----- Call and let the patient know that her potassium level is in normal range.  noticed low vitamin D, advise patient to start taking OTC 2000 units daily.

## 2016-01-16 ENCOUNTER — Emergency Department: Payer: Self-pay

## 2016-01-16 ENCOUNTER — Encounter: Payer: Self-pay | Admitting: Emergency Medicine

## 2016-01-16 ENCOUNTER — Emergency Department
Admission: EM | Admit: 2016-01-16 | Discharge: 2016-01-16 | Disposition: A | Discharge status: Home / Self Care | Payer: Self-pay | Attending: Emergency Medicine | Admitting: Emergency Medicine

## 2016-01-16 ENCOUNTER — Other Ambulatory Visit: Payer: Self-pay

## 2016-01-16 DIAGNOSIS — F17200 Nicotine dependence, unspecified, uncomplicated: Secondary | ICD-10-CM | POA: Insufficient documentation

## 2016-01-16 DIAGNOSIS — J40 Bronchitis, not specified as acute or chronic: Secondary | ICD-10-CM | POA: Insufficient documentation

## 2016-01-16 LAB — CBC
HEMATOCRIT: 43.4 % (ref 35.0–47.0)
HEMOGLOBIN: 14.8 g/dL (ref 12.0–16.0)
MCH: 31.2 pg (ref 26.0–34.0)
MCHC: 34.2 g/dL (ref 32.0–36.0)
MCV: 91.2 fL (ref 80.0–100.0)
Platelets: 220 10*3/uL (ref 150–440)
RBC: 4.75 MIL/uL (ref 3.80–5.20)
RDW: 14.2 % (ref 11.5–14.5)
WBC: 12.1 10*3/uL — ABNORMAL HIGH (ref 3.6–11.0)

## 2016-01-16 LAB — BASIC METABOLIC PANEL
Anion gap: 6 (ref 5–15)
BUN: 12 mg/dL (ref 6–20)
CO2: 24 mmol/L (ref 22–32)
Calcium: 9.5 mg/dL (ref 8.9–10.3)
Chloride: 107 mmol/L (ref 101–111)
Creatinine, Ser: 0.7 mg/dL (ref 0.44–1.00)
GFR calc Af Amer: >60 mL/min (ref >60)
GLUCOSE: 105 mg/dL — AB (ref 65–99)
POTASSIUM: 4.1 mmol/L (ref 3.5–5.1)
Sodium: 137 mmol/L (ref 135–145)

## 2016-01-16 LAB — TROPONIN I: Troponin I: <0.03 ng/mL (ref <0.03)

## 2016-01-16 MED ORDER — PREDNISONE 20 MG PO TABS
60.0000 mg | ORAL_TABLET | Freq: Once | ORAL | Status: AC
  Administered 2016-01-16: 60 mg via ORAL
  Filled 2016-01-16: qty 3

## 2016-01-16 MED ORDER — LEVOFLOXACIN 500 MG PO TABS: 500.0000 mg | ORAL_TABLET | Freq: Every day | ORAL | 0 refills | Status: AC

## 2016-01-16 MED ORDER — ALBUTEROL SULFATE HFA 108 (90 BASE) MCG/ACT IN AERS: 2.0000 | INHALATION_SPRAY | Freq: Four times a day (QID) | RESPIRATORY_TRACT | 2 refills | Status: DC | PRN

## 2016-01-16 MED ORDER — IPRATROPIUM-ALBUTEROL 0.5-2.5 (3) MG/3ML IN SOLN
3.0000 mL | Freq: Once | RESPIRATORY_TRACT | Status: AC
  Administered 2016-01-16: 3 mL via RESPIRATORY_TRACT
  Filled 2016-01-16: qty 3

## 2016-01-16 MED ORDER — LEVOFLOXACIN 500 MG PO TABS
500.0000 mg | ORAL_TABLET | Freq: Once | ORAL | Status: AC
  Administered 2016-01-16: 500 mg via ORAL
  Filled 2016-01-16: qty 1

## 2016-01-16 MED ORDER — PREDNISONE 10 MG (21) PO TBPK: ORAL_TABLET | ORAL | 0 refills | Status: DC

## 2016-01-16 NOTE — ED Triage Notes (Signed)
Says sob for 3 weeks--especially at night when she lays down.  Has been trying mucinex, dayquil, nyquil with no relief.

## 2016-01-16 NOTE — ED Provider Notes (Signed)
[facility] Emergency Department Provider Note        Time seen: ----------------------------------------- 8:56 AM on 01/16/2016 -----------------------------------------    I have reviewed the triage vital signs and the nursing notes.   HISTORY  Chief Complaint Shortness of Breath    HPI Melissa Dean is a 45 y.o. female who presents to ER for shortness of breath last 3 weeks. Patient states her symptoms are especially worse at night when she lays down. She's been trying over-the-counter medications without any improvement.Patient denies fevers, has had cough and persistent shortness of breath. Nothing has made her symptoms better.   History reviewed. No pertinent past medical history.  Patient Active Problem List   Diagnosis Date Noted  . Anxiety state 07/18/2014  . Tobacco abuse 07/18/2014  . Abnormal TSH 07/18/2014  . Atypical chest pain 07/18/2014  . Chest pain 07/06/2014    Past Surgical History:  Procedure Laterality Date  . CESAREAN SECTION    . CESAREAN SECTION    . CHOLECYSTECTOMY    . TONSILLECTOMY    . TUBAL LIGATION      Allergies Review of patient's allergies indicates no known allergies.  Social History Social History  Substance Use Topics  . Smoking status: Current Every Day Smoker    Packs/day: 1.00    Years: 20.00  . Smokeless tobacco: Never Used  . Alcohol use No    Review of Systems Constitutional: Negative for fever. Cardiovascular: Negative for chest pain. Respiratory: Positive for shortness of breath and cough Gastrointestinal: Negative for abdominal pain, vomiting and diarrhea. Genitourinary: Negative for dysuria. Musculoskeletal: Negative for back pain. Skin: Negative for rash. Neurological: Negative for headaches, focal weakness or numbness.  10-point ROS otherwise negative.  ____________________________________________   PHYSICAL EXAM:  VITAL SIGNS: ED Triage Vitals  Enc Vitals Group      BP 01/16/16 0832 122/67     Pulse Rate 01/16/16 0832 78     Resp 01/16/16 0832 (!) 22     Temp 01/16/16 0832 97.7 F (36.5 C)     Temp Source 01/16/16 0832 Oral     SpO2 01/16/16 0832 93 %     Weight 01/16/16 0833 125 lb (56.7 kg)     Height 01/16/16 0833 5\' 2"  (1.575 m)     Head Circumference --      Peak Flow --      Pain Score 01/16/16 0844 4     Pain Loc --      Pain Edu? --      Excl. in GC? --     Constitutional: Alert and oriented. Well appearing and in no distress. Eyes: Conjunctivae are normal. PERRL. Normal extraocular movements. ENT   Head: Normocephalic and atraumatic.   Nose: No congestion/rhinnorhea.   Mouth/Throat: Mucous membranes are moist.   Neck: No stridor. Cardiovascular: Normal rate, regular rhythm. No murmurs, rubs, or gallops. Respiratory: Normal respiratory effort without tachypnea nor retractions. Bilateral wheezing with occasional rhonchi Gastrointestinal: Soft and nontender. Normal bowel sounds Musculoskeletal: Nontender with normal range of motion in all extremities. No lower extremity tenderness nor edema. Neurologic:  Normal speech and language. No gross focal neurologic deficits are appreciated.  Skin:  Skin is warm, dry and intact. No rash noted. Psychiatric: Mood and affect are normal. Speech and behavior are normal.  ____________________________________________  EKG: Interpreted by me. Sinus rhythm rate of 75 bpm, normal PR interval, normal QRS size, normal QT, possible septal infarct.  ____________________________________________  ED COURSE:  Pertinent labs & imaging  results that were available during my care of the patient were reviewed by me and considered in my medical decision making (see chart for details). Clinical Course  Patients in no distress, she will receive breathing treatments, clinically likely with bronchitis.  Procedures ____________________________________________   LABS (pertinent  positives/negatives)  Labs Reviewed  BASIC METABOLIC PANEL - Abnormal; Notable for the following:       Result Value   Glucose, Bld 105 (*)    All other components within normal limits  CBC - Abnormal; Notable for the following:    WBC 12.1 (*)    All other components within normal limits  TROPONIN I    RADIOLOGY Images were viewed by me  Chest x-ray Is unremarkable ____________________________________________  FINAL ASSESSMENT AND PLAN  Bronchitis  Plan: Patient with labs and imaging as dictated above. Patient presents with subacute bronchitis. With 3 weeks of symptoms, I will start her on Levaquin. She has improved with DuoNeb since steroids. She'll be discharged with albuterol and a steroid taper as well. She is stable for outpatient follow-up with her doctor.   Emily Filbert, MD   Note: This dictation was prepared with Dragon dictation. Any transcriptional errors that result from this process are unintentional    Emily Filbert, MD 01/16/16 [phone]

## 2016-04-28 ENCOUNTER — Emergency Department
Admission: EM | Admit: 2016-04-28 | Discharge: 2016-04-29 | Disposition: A | Discharge status: Home / Self Care | Payer: Self-pay | Attending: Emergency Medicine | Admitting: Emergency Medicine

## 2016-04-28 ENCOUNTER — Emergency Department: Payer: Self-pay

## 2016-04-28 DIAGNOSIS — J40 Bronchitis, not specified as acute or chronic: Secondary | ICD-10-CM | POA: Insufficient documentation

## 2016-04-28 DIAGNOSIS — Z79899 Other long term (current) drug therapy: Secondary | ICD-10-CM | POA: Insufficient documentation

## 2016-04-28 DIAGNOSIS — F17200 Nicotine dependence, unspecified, uncomplicated: Secondary | ICD-10-CM | POA: Insufficient documentation

## 2016-04-28 LAB — CBC
HCT: 43 % (ref 35.0–47.0)
HEMOGLOBIN: 15 g/dL (ref 12.0–16.0)
MCH: 31.3 pg (ref 26.0–34.0)
MCHC: 34.9 g/dL (ref 32.0–36.0)
MCV: 89.8 fL (ref 80.0–100.0)
Platelets: 247 10*3/uL (ref 150–440)
RBC: 4.79 MIL/uL (ref 3.80–5.20)
RDW: 13.4 % (ref 11.5–14.5)
WBC: 14.2 10*3/uL — ABNORMAL HIGH (ref 3.6–11.0)

## 2016-04-28 LAB — TROPONIN I: Troponin I: <0.03 ng/mL (ref <0.03)

## 2016-04-28 LAB — BASIC METABOLIC PANEL
ANION GAP: 8 (ref 5–15)
BUN: 12 mg/dL (ref 6–20)
CHLORIDE: 106 mmol/L (ref 101–111)
CO2: 26 mmol/L (ref 22–32)
CREATININE: 0.66 mg/dL (ref 0.44–1.00)
Calcium: 9.4 mg/dL (ref 8.9–10.3)
GFR calc non Af Amer: >60 mL/min (ref >60)
GLUCOSE: 111 mg/dL — AB (ref 65–99)
Potassium: 4.1 mmol/L (ref 3.5–5.1)
Sodium: 140 mmol/L (ref 135–145)

## 2016-04-28 MED ORDER — AZITHROMYCIN 250 MG PO TABS: ORAL_TABLET | ORAL | 0 refills | Status: AC

## 2016-04-28 MED ORDER — IPRATROPIUM-ALBUTEROL 0.5-2.5 (3) MG/3ML IN SOLN
9.0000 mL | Freq: Once | RESPIRATORY_TRACT | Status: AC
  Administered 2016-04-28: 9 mL via RESPIRATORY_TRACT
  Filled 2016-04-28: qty 9

## 2016-04-28 MED ORDER — ALBUTEROL SULFATE HFA 108 (90 BASE) MCG/ACT IN AERS: 2.0000 | INHALATION_SPRAY | Freq: Four times a day (QID) | RESPIRATORY_TRACT | 0 refills | Status: DC | PRN

## 2016-04-28 MED ORDER — KETOROLAC TROMETHAMINE 60 MG/2ML IM SOLN
30.0000 mg | Freq: Once | INTRAMUSCULAR | Status: AC
  Administered 2016-04-28: 30 mg via INTRAMUSCULAR
  Filled 2016-04-28: qty 2

## 2016-04-28 MED ORDER — PREDNISONE 20 MG PO TABS
60.0000 mg | ORAL_TABLET | Freq: Once | ORAL | Status: AC
  Administered 2016-04-28: 60 mg via ORAL
  Filled 2016-04-28: qty 3

## 2016-04-28 MED ORDER — AZITHROMYCIN 500 MG PO TABS
500.0000 mg | ORAL_TABLET | Freq: Once | ORAL | Status: AC
  Administered 2016-04-28: 500 mg via ORAL
  Filled 2016-04-28: qty 1

## 2016-04-28 MED ORDER — ALBUTEROL SULFATE (2.5 MG/3ML) 0.083% IN NEBU
INHALATION_SOLUTION | RESPIRATORY_TRACT | Status: AC
  Administered 2016-04-28: 5 mg via RESPIRATORY_TRACT
  Filled 2016-04-28: qty 3

## 2016-04-28 MED ORDER — ALBUTEROL SULFATE (2.5 MG/3ML) 0.083% IN NEBU
5.0000 mg | INHALATION_SOLUTION | Freq: Once | RESPIRATORY_TRACT | Status: AC
  Administered 2016-04-28: 5 mg via RESPIRATORY_TRACT
  Filled 2016-04-28: qty 6

## 2016-04-28 MED ORDER — PREDNISONE 50 MG PO TABS: ORAL_TABLET | ORAL | 0 refills | Status: DC

## 2016-04-28 NOTE — ED Provider Notes (Signed)
[facility] Emergency Department Provider Note  ____________________________________________   First MD Initiated Contact with Patient 04/28/16 2138     (approximate)  I have reviewed the triage vital signs and the nursing notes.   HISTORY  Chief Complaint Shortness of Breath; Chest Pain; and Cough   HPI Melissa Dean is a 46 y.o. female with a history of bronchitis who is a smoker who is presenting to the emergency department today with 1 week of cough with chest tightness and chest pain which is sharp with coughing. Says the pain is also in her back and worse with coughing. She denies any fever. No known sick contacts. Says that she has had steroids as well as bronchodilators in the past for similar problems.   History reviewed. No pertinent past medical history.  Patient Active Problem List   Diagnosis Date Noted  . Anxiety state 07/18/2014  . Tobacco abuse 07/18/2014  . Abnormal TSH 07/18/2014  . Atypical chest pain 07/18/2014  . Chest pain 07/06/2014    Past Surgical History:  Procedure Laterality Date  . CESAREAN SECTION    . CESAREAN SECTION    . CHOLECYSTECTOMY    . TONSILLECTOMY    . TUBAL LIGATION      Prior to Admission medications   Medication Sig Start Date End Date Taking? Authorizing Provider  albuterol (PROVENTIL HFA;VENTOLIN HFA) 108 (90 Base) MCG/ACT inhaler Inhale 2 puffs into the lungs every 6 (six) hours as needed for wheezing or shortness of breath. 01/16/16   Emily Filbert, MD  citalopram (CELEXA) 20 MG tablet Take 1 tablet (20 mg total) by mouth daily. 07/18/14   Doris Cheadle, MD  nicotine (NICODERM CQ) 21 mg/24hr patch Place 1 patch (21 mg total) onto the skin daily. Patient not taking: Reported on 01/16/2016 07/18/14   Doris Cheadle, MD  predniSONE (STERAPRED UNI-PAK 21 TAB) 10 MG (21) TBPK tablet Dispense steroid taper pack and instructed 01/16/16   Emily Filbert, MD    Allergies Patient has no known  allergies.  Family History  Problem Relation Age of Onset  . Heart disease Father   . Hypertension Father   . Diabetes Father   . Cancer Father     lung cancer  . Diabetes Brother   . Hypertension Paternal Grandfather   . Diabetes Paternal Grandfather   . Thyroid disease      Social History Social History  Substance Use Topics  . Smoking status: Current Every Day Smoker    Packs/day: 1.00    Years: 20.00  . Smokeless tobacco: Never Used  . Alcohol use No    Review of Systems Constitutional: No fever/chills Eyes: No visual changes. ENT: No sore throat. Cardiovascular: As above Respiratory: As above Gastrointestinal: No abdominal pain.  No nausea, no vomiting.  No diarrhea.  No constipation. Genitourinary: Negative for dysuria. Musculoskeletal: Negative for back pain. Skin: Negative for rash. Neurological: Negative for headaches, focal weakness or numbness.  10-point ROS otherwise negative.  ____________________________________________   PHYSICAL EXAM:  VITAL SIGNS: ED Triage Vitals  Enc Vitals Group     BP 04/28/16 2026 125/75     Pulse Rate 04/28/16 2026 89     Resp 04/28/16 2026 18     Temp 04/28/16 2026 97.8 F (36.6 C)     Temp Source 04/28/16 2026 Oral     SpO2 04/28/16 2026 94 %     Weight 04/28/16 2027 125 lb (56.7 kg)     Height 04/28/16 2027  5\' 2"  (1.575 m)     Head Circumference --      Peak Flow --      Pain Score 04/28/16 2027 8     Pain Loc --      Pain Edu? --      Excl. in GC? --     Constitutional: Alert and oriented. Well appearing and in no acute distress. Eyes: Conjunctivae are normal. PERRL. EOMI. Head: Atraumatic. Nose: No congestion/rhinnorhea. Mouth/Throat: Mucous membranes are moist.   Neck: No stridor.   Cardiovascular: Normal rate, regular rhythm. Grossly normal heart sounds.   Respiratory: Normal respiratory effort.  No retractions. Speaks in full sentences. Wheezing throughout with expiratory cough. Gastrointestinal:  Soft and nontender. No distention.  Musculoskeletal: No lower extremity tenderness nor edema.  No joint effusions. Neurologic:  Normal speech and language. No gross focal neurologic deficits are appreciated.  Skin:  Skin is warm, dry and intact. No rash noted. Psychiatric: Mood and affect are normal. Speech and behavior are normal.  ____________________________________________   LABS (all labs ordered are listed, but only abnormal results are displayed)  Labs Reviewed  BASIC METABOLIC PANEL - Abnormal; Notable for the following:       Result Value   Glucose, Bld 111 (*)    All other components within normal limits  CBC - Abnormal; Notable for the following:    WBC 14.2 (*)    All other components within normal limits  TROPONIN I   ____________________________________________  EKG  ED ECG REPORT I, Arelia Longest, the attending physician, personally viewed and interpreted this ECG.   Date: 04/28/2016  EKG Time: 2101  Rate: 98  Rhythm: normal sinus rhythm  Axis: Normal axis  Intervals:none  ST&T Change: No ST segment elevation or depression. No abnormal T-wave inversion.  ____________________________________________  RADIOLOGY  DG Chest 2 View (Final result)  Result time 04/28/16 21:03:41  Final result by Kennith Center, MD (04/28/16 21:03:41)           Narrative:   CLINICAL DATA: Shortness of breath with cough and chest pain for 1 week.  EXAM: CHEST 2 VIEW  COMPARISON: 01/16/2016  FINDINGS: The heart size and mediastinal contours are within normal limits. Both lungs are clear. The visualized skeletal structures are unremarkable.  IMPRESSION: No active cardiopulmonary disease.   Electronically Signed By: Kennith Center M.D. On: 04/28/2016 21:03            ____________________________________________   PROCEDURES  Procedure(s) performed:   Procedures  Critical Care performed:    ____________________________________________   INITIAL IMPRESSION / ASSESSMENT AND PLAN / ED COURSE  Pertinent labs & imaging results that were available during my care of the patient were reviewed by me and considered in my medical decision making (see chart for details).  ----------------------------------------- 11:14 PM on 04/28/2016 -----------------------------------------  Patient reevaluated and still with scant extra wheezing. Says that she is still having chest pain with coughing. No tenderness palpation on the chest wall. She says the pain is worsened especially with coughing. PERC negative. Reassuring EKG and troponin after week of symptoms. Believe this likely chest wall pain. Signed out to Dr. Manson Passey for reevaluation.      ____________________________________________   FINAL CLINICAL IMPRESSION(S) / ED DIAGNOSES  Bronchitis.      NEW MEDICATIONS STARTED DURING THIS VISIT:  New Prescriptions   No medications on file     Note:  This document was prepared using Dragon voice recognition software and may include unintentional dictation errors.  Myrna Blazeravid Matthew Bailey Faiella, MD 04/28/16 [phone]

## 2016-04-28 NOTE — ED Triage Notes (Signed)
Pt presents to ED with c/o SHOB, cough, and chest pain x1 week. Pt reports chest congestion with productive cough (clear sputum), shortness of breath, and chest pain "from the coughing". Pt (+) smoker, tried OTC Mucinex without relief. Pt reports (+)m flu "week before last", but denies any c/o N/V/D or fever at this time.

## 2016-04-29 MED ORDER — IBUPROFEN 800 MG PO TABS
800.0000 mg | ORAL_TABLET | Freq: Once | ORAL | Status: AC
  Administered 2016-04-29: 800 mg via ORAL
  Filled 2016-04-29: qty 1

## 2016-05-03 ENCOUNTER — Emergency Department
Admission: EM | Admit: 2016-05-03 | Discharge: 2016-05-03 | Disposition: A | Discharge status: Home / Self Care | Payer: Self-pay | Attending: Emergency Medicine | Admitting: Emergency Medicine

## 2016-05-03 ENCOUNTER — Encounter: Payer: Self-pay | Admitting: Emergency Medicine

## 2016-05-03 ENCOUNTER — Emergency Department: Payer: Self-pay

## 2016-05-03 DIAGNOSIS — F17200 Nicotine dependence, unspecified, uncomplicated: Secondary | ICD-10-CM | POA: Insufficient documentation

## 2016-05-03 DIAGNOSIS — J069 Acute upper respiratory infection, unspecified: Secondary | ICD-10-CM | POA: Insufficient documentation

## 2016-05-03 DIAGNOSIS — Z79899 Other long term (current) drug therapy: Secondary | ICD-10-CM | POA: Insufficient documentation

## 2016-05-03 LAB — BASIC METABOLIC PANEL
Anion gap: 5 (ref 5–15)
BUN: 7 mg/dL (ref 6–20)
CHLORIDE: 109 mmol/L (ref 101–111)
CO2: 27 mmol/L (ref 22–32)
CREATININE: 0.74 mg/dL (ref 0.44–1.00)
Calcium: 8.4 mg/dL — ABNORMAL LOW (ref 8.9–10.3)
GFR calc Af Amer: >60 mL/min (ref >60)
GFR calc non Af Amer: >60 mL/min (ref >60)
GLUCOSE: 96 mg/dL (ref 65–99)
POTASSIUM: 3.7 mmol/L (ref 3.5–5.1)
SODIUM: 141 mmol/L (ref 135–145)

## 2016-05-03 LAB — CBC
HCT: 42.5 % (ref 35.0–47.0)
Hemoglobin: 14.3 g/dL (ref 12.0–16.0)
MCH: 31 pg (ref 26.0–34.0)
MCHC: 33.6 g/dL (ref 32.0–36.0)
MCV: 92.2 fL (ref 80.0–100.0)
PLATELETS: 206 10*3/uL (ref 150–440)
RBC: 4.61 MIL/uL (ref 3.80–5.20)
RDW: 13.6 % (ref 11.5–14.5)
WBC: 9.7 10*3/uL (ref 3.6–11.0)

## 2016-05-03 LAB — INFLUENZA PANEL BY PCR (TYPE A & B)
Influenza A By PCR: NEGATIVE
Influenza B By PCR: NEGATIVE

## 2016-05-03 LAB — TROPONIN I: Troponin I: <0.03 ng/mL (ref <0.03)

## 2016-05-03 MED ORDER — IPRATROPIUM-ALBUTEROL 0.5-2.5 (3) MG/3ML IN SOLN
RESPIRATORY_TRACT | Status: AC
  Filled 2016-05-03: qty 9

## 2016-05-03 MED ORDER — ALBUTEROL SULFATE (2.5 MG/3ML) 0.083% IN NEBU
5.0000 mg | INHALATION_SOLUTION | Freq: Once | RESPIRATORY_TRACT | Status: AC
  Administered 2016-05-03: 5 mg via RESPIRATORY_TRACT

## 2016-05-03 MED ORDER — ALBUTEROL SULFATE (2.5 MG/3ML) 0.083% IN NEBU
INHALATION_SOLUTION | RESPIRATORY_TRACT | Status: AC
  Filled 2016-05-03: qty 9

## 2016-05-03 MED ORDER — GUAIFENESIN-CODEINE 100-10 MG/5ML PO SOLN: 5.0000 mL | Freq: Four times a day (QID) | ORAL | 0 refills | Status: DC | PRN

## 2016-05-03 MED ORDER — HYDROCOD POLST-CPM POLST ER 10-8 MG/5ML PO SUER
5.0000 mL | Freq: Once | ORAL | Status: AC
  Administered 2016-05-03: 5 mL via ORAL
  Filled 2016-05-03: qty 5

## 2016-05-03 NOTE — ED Notes (Signed)
FIRST NURSE NOTE: Shortness of breath worse since this morning. Pt reports she was here earlier this week. Unable to finish whole sentence without running out of breath. Retraction

## 2016-05-03 NOTE — ED Provider Notes (Signed)
[facility] Emergency Department Provider Note  Time seen: 1:34 PM  I have reviewed the triage vital signs and the nursing notes.   HISTORY  Chief Complaint Respiratory Distress    HPI Melissa Dean is a 46 y.o. female with a past medical history of anxiety, chest discomfort, presents to the emergency department with symptoms of chest pain, cough, congestion and difficulty breathing. According to the patient she was seen in the emergency department 5 days ago for similar symptoms. At that time she received some breathing treatments was discharged with a Z-Pak and prednisone and albuterol. Patient states she can only afford the Z-Pak she is completing tomorrow. Patient states she continued to feel very short of breath today so she return to the emergency department for evaluation. Patient continues to state some discomfort in her back at only when coughing. Denies any pleuritic chest pain. Denies any leg pain or swelling. Patient denies any known fever.  History reviewed. No pertinent past medical history.  Patient Active Problem List   Diagnosis Date Noted  . Anxiety state 07/18/2014  . Tobacco abuse 07/18/2014  . Abnormal TSH 07/18/2014  . Atypical chest pain 07/18/2014  . Chest pain 07/06/2014    Past Surgical History:  Procedure Laterality Date  . CESAREAN SECTION    . CESAREAN SECTION    . CHOLECYSTECTOMY    . TONSILLECTOMY    . TUBAL LIGATION      Prior to Admission medications   Medication Sig Start Date End Date Taking? Authorizing Provider  albuterol (PROVENTIL HFA;VENTOLIN HFA) 108 (90 Base) MCG/ACT inhaler Inhale 2 puffs into the lungs every 6 (six) hours as needed for wheezing or shortness of breath. 04/28/16   Myrna Blazeravid Matthew Schaevitz, MD  azithromycin (ZITHROMAX Z-PAK) 250 MG tablet Take 2 tablets (500 mg) on  Day 1,  followed by 1 tablet (250 mg) once daily on Days 2 through 5. 04/28/16 05/03/16  Myrna Blazeravid Matthew Schaevitz, MD  citalopram  (CELEXA) 20 MG tablet Take 1 tablet (20 mg total) by mouth daily. 07/18/14   Doris Cheadleeepak Advani, MD  nicotine (NICODERM CQ) 21 mg/24hr patch Place 1 patch (21 mg total) onto the skin daily. Patient not taking: Reported on 01/16/2016 07/18/14   Doris Cheadleeepak Advani, MD  predniSONE (DELTASONE) 50 MG tablet Take 1 tab po daily x5 days 04/28/16   Myrna Blazeravid Matthew Schaevitz, MD    No Known Allergies  Family History  Problem Relation Age of Onset  . Heart disease Father   . Hypertension Father   . Diabetes Father   . Cancer Father     lung cancer  . Diabetes Brother   . Hypertension Paternal Grandfather   . Diabetes Paternal Grandfather   . Thyroid disease      Social History Social History  Substance Use Topics  . Smoking status: Current Every Day Smoker    Packs/day: 1.00    Years: 20.00  . Smokeless tobacco: Never Used  . Alcohol use No    Review of Systems Constitutional: Negative for fever. Cardiovascular: Negative for chest pain. Respiratory: Positive for shortness of breath. Cough and congestion. Gastrointestinal: Negative for abdominal pain Neurological: Negative for headache 10-point ROS otherwise negative.  ____________________________________________   PHYSICAL EXAM:  VITAL SIGNS: ED Triage Vitals  Enc Vitals Group     BP 05/03/16 1226 119/84     Pulse Rate 05/03/16 1226 78     Resp 05/03/16 1226 (!) 28     Temp 05/03/16 1226 98.1 F (36.7 C)  Temp Source 05/03/16 1226 Oral     SpO2 05/03/16 1226 99 %     Weight --      Height --      Head Circumference --      Peak Flow --      Pain Score 05/03/16 1220 8     Pain Loc --      Pain Edu? --      Excl. in GC? --     Constitutional: Alert and oriented. Well appearing and in no distress. Eyes: Normal exam ENT   Head: Normocephalic and atraumatic   Mouth/Throat: Mucous membranes are moist. Cardiovascular: Normal rate, regular rhythm. No murmur Respiratory: Normal respiratory effort without tachypnea nor  retractions. Breath sounds are clear  Gastrointestinal: Soft and nontender. No distention Musculoskeletal: Nontender with normal range of motion in all extremities.  Neurologic:  Normal speech and language. No gross focal neurologic deficits  Skin:  Skin is warm, dry and intact.  Psychiatric: Mood and affect are normal.   ____________________________________________    EKG  EKG reviewed and interpreted by myself shows sinus rhythm at 78 bpm, narrow QRS, normal axis, no concerning ST changes. Larson normal intervals.  ____________________________________________    RADIOLOGY  Chest x-ray is negative  ____________________________________________   INITIAL IMPRESSION / ASSESSMENT AND PLAN / ED COURSE  Pertinent labs & imaging results that were available during my care of the patient were reviewed by me and considered in my medical decision making (see chart for details).  The patient presents the emergency department with shortness of breath, cough and congestion. Patient has received a breathing treatment in the emergency department upon my examination the patient has clear lung sounds bilaterally, no distress, normal vitals with a 96-98 percent room air saturation, normal heart rate, afebrile. As patient is complaining of back discomfort in her upper back with coughing we'll obtain labs including a troponin. We will obtain a repeat chest x-ray as well as an influenza swab.   The patient's labs are resulted largely within normal limits. Influenza negative. Chest x-ray negative. Patient has completed a course of Zithromax. Likely bronchitis. We will discharge with cough medication. ____________________________________________   FINAL CLINICAL IMPRESSION(S) / ED DIAGNOSES  Dyspnea URI    Minna Antis, MD 05/03/16 1440

## 2016-05-04 ENCOUNTER — Encounter: Payer: Self-pay | Admitting: *Deleted

## 2016-05-04 ENCOUNTER — Ambulatory Visit: Payer: Self-pay | Attending: Oncology | Admitting: *Deleted

## 2016-05-04 VITALS — BP 103/62 | HR 63 | Temp 98.1°F | Ht 62.6 in | Wt 124.7 lb

## 2016-05-04 DIAGNOSIS — N63 Unspecified lump in unspecified breast: Secondary | ICD-10-CM

## 2016-05-04 NOTE — Progress Notes (Signed)
Subjective:     Patient ID: Melissa Dean, female   DOB: 1970/10/21, 46 y.o.   MRN: [id number]  HPI   Review of Systems     Objective:   Physical Exam  Pulmonary/Chest: Right breast exhibits mass. Right breast exhibits no inverted nipple, no nipple discharge, no skin change and no tenderness. Left breast exhibits no inverted nipple, no mass, no nipple discharge, no skin change and no tenderness. Breasts are symmetrical.         Assessment:     46 year old White [facility]  States she found a lump in her left breast a few weeks ago.  States she was a "little more concerned since it is not painful".  Family history of lung cancer in her dad.  No history of breast or ovarian cancer.  States she does not know her mom or her mothers side of the family.  On clinical breast exam I can see and palpate a small approximate 0.5 cm hard, mobile nodule at 7:00 left breast at the edge of the inframmary ridge.  Taught self breast awareness.  Patient has been screened for eligibility.  She does not have any insurance, Medicare or Medicaid.  She also meets financial eligibility.  Hand-out given on the Affordable Care Act.    Plan:     Will get bilateral diagnostic mammogram and ultrasound of the left breast.  Will follow-up per [facility] protocol.

## 2016-05-04 NOTE — Patient Instructions (Signed)
Gave patient hand-out, Women Staying Healthy, Active and Well from [facility], with education on breast health, pap smears, heart and colon health. 

## 2016-05-07 ENCOUNTER — Other Ambulatory Visit: Payer: Self-pay

## 2016-05-07 ENCOUNTER — Ambulatory Visit: Payer: Self-pay

## 2016-05-14 ENCOUNTER — Ambulatory Visit
Admission: RE | Admit: 2016-05-14 | Discharge: 2016-05-14 | Disposition: A | Discharge status: Home / Self Care | Payer: Self-pay | Source: Ambulatory Visit | Attending: Oncology | Admitting: Oncology

## 2016-05-14 DIAGNOSIS — N63 Unspecified lump in unspecified breast: Secondary | ICD-10-CM

## 2016-05-15 ENCOUNTER — Other Ambulatory Visit: Payer: Self-pay | Admitting: *Deleted

## 2016-05-15 DIAGNOSIS — N6489 Other specified disorders of breast: Secondary | ICD-10-CM

## 2016-05-15 DIAGNOSIS — N63 Unspecified lump in unspecified breast: Secondary | ICD-10-CM

## 2016-05-24 HISTORY — PX: BREAST BIOPSY: SHX20

## 2016-05-26 ENCOUNTER — Ambulatory Visit
Admission: RE | Admit: 2016-05-26 | Discharge: 2016-05-26 | Disposition: A | Discharge status: Home / Self Care | Payer: Self-pay | Source: Ambulatory Visit | Attending: Oncology | Admitting: Oncology

## 2016-05-26 DIAGNOSIS — N6489 Other specified disorders of breast: Secondary | ICD-10-CM

## 2016-05-27 LAB — SURGICAL PATHOLOGY

## 2016-06-04 ENCOUNTER — Other Ambulatory Visit: Payer: Self-pay | Admitting: *Deleted

## 2016-06-04 DIAGNOSIS — R920 Mammographic microcalcification found on diagnostic imaging of breast: Secondary | ICD-10-CM

## 2016-06-12 ENCOUNTER — Encounter: Payer: Self-pay | Admitting: *Deleted

## 2016-06-12 NOTE — Progress Notes (Signed)
Letter mailed to inform patient of her 6 month follow-up mammogram on 11/26/16 @ 2:00.  [facility]

## 2016-07-04 ENCOUNTER — Emergency Department: Payer: Self-pay

## 2016-07-04 ENCOUNTER — Encounter: Payer: Self-pay | Admitting: Emergency Medicine

## 2016-07-04 ENCOUNTER — Inpatient Hospital Stay
Admission: EM | Admit: 2016-07-04 | Discharge: 2016-07-05 | DRG: 202 | Disposition: A | Discharge status: Home / Self Care | Payer: Self-pay | Attending: Internal Medicine | Admitting: Internal Medicine

## 2016-07-04 DIAGNOSIS — R0789 Other chest pain: Secondary | ICD-10-CM | POA: Diagnosis present

## 2016-07-04 DIAGNOSIS — J209 Acute bronchitis, unspecified: Principal | ICD-10-CM

## 2016-07-04 DIAGNOSIS — I959 Hypotension, unspecified: Secondary | ICD-10-CM | POA: Diagnosis present

## 2016-07-04 DIAGNOSIS — Z833 Family history of diabetes mellitus: Secondary | ICD-10-CM

## 2016-07-04 DIAGNOSIS — F17200 Nicotine dependence, unspecified, uncomplicated: Secondary | ICD-10-CM | POA: Diagnosis present

## 2016-07-04 DIAGNOSIS — J9601 Acute respiratory failure with hypoxia: Secondary | ICD-10-CM

## 2016-07-04 DIAGNOSIS — R0602 Shortness of breath: Secondary | ICD-10-CM

## 2016-07-04 DIAGNOSIS — Z801 Family history of malignant neoplasm of trachea, bronchus and lung: Secondary | ICD-10-CM

## 2016-07-04 DIAGNOSIS — E860 Dehydration: Secondary | ICD-10-CM | POA: Diagnosis present

## 2016-07-04 DIAGNOSIS — R0902 Hypoxemia: Secondary | ICD-10-CM

## 2016-07-04 DIAGNOSIS — Z8249 Family history of ischemic heart disease and other diseases of the circulatory system: Secondary | ICD-10-CM

## 2016-07-04 DIAGNOSIS — Z716 Tobacco abuse counseling: Secondary | ICD-10-CM

## 2016-07-04 DIAGNOSIS — M549 Dorsalgia, unspecified: Secondary | ICD-10-CM | POA: Diagnosis present

## 2016-07-04 DIAGNOSIS — Z79899 Other long term (current) drug therapy: Secondary | ICD-10-CM

## 2016-07-04 DIAGNOSIS — Z9049 Acquired absence of other specified parts of digestive tract: Secondary | ICD-10-CM

## 2016-07-04 DIAGNOSIS — J440 Chronic obstructive pulmonary disease with acute lower respiratory infection: Secondary | ICD-10-CM | POA: Diagnosis present

## 2016-07-04 DIAGNOSIS — J449 Chronic obstructive pulmonary disease, unspecified: Secondary | ICD-10-CM

## 2016-07-04 DIAGNOSIS — J441 Chronic obstructive pulmonary disease with (acute) exacerbation: Secondary | ICD-10-CM | POA: Diagnosis present

## 2016-07-04 LAB — BASIC METABOLIC PANEL
Anion gap: 6 (ref 5–15)
BUN: 9 mg/dL (ref 6–20)
CALCIUM: 9.1 mg/dL (ref 8.9–10.3)
CO2: 26 mmol/L (ref 22–32)
CREATININE: 0.78 mg/dL (ref 0.44–1.00)
Chloride: 107 mmol/L (ref 101–111)
GFR calc non Af Amer: >60 mL/min (ref >60)
Glucose, Bld: 99 mg/dL (ref 65–99)
Potassium: 3.8 mmol/L (ref 3.5–5.1)
SODIUM: 139 mmol/L (ref 135–145)

## 2016-07-04 LAB — CBC
HCT: 44.6 % (ref 35.0–47.0)
Hemoglobin: 15.2 g/dL (ref 12.0–16.0)
MCH: 31.8 pg (ref 26.0–34.0)
MCHC: 33.9 g/dL (ref 32.0–36.0)
MCV: 93.7 fL (ref 80.0–100.0)
PLATELETS: 191 10*3/uL (ref 150–440)
RBC: 4.76 MIL/uL (ref 3.80–5.20)
RDW: 14.4 % (ref 11.5–14.5)
WBC: 7.8 10*3/uL (ref 3.6–11.0)

## 2016-07-04 LAB — TROPONIN I

## 2016-07-04 MED ORDER — ALBUTEROL SULFATE (2.5 MG/3ML) 0.083% IN NEBU
2.5000 mg | INHALATION_SOLUTION | RESPIRATORY_TRACT | Status: DC
  Administered 2016-07-04 (×2): 2.5 mg via RESPIRATORY_TRACT
  Filled 2016-07-04 (×2): qty 3

## 2016-07-04 MED ORDER — ALBUTEROL (5 MG/ML) CONTINUOUS INHALATION SOLN
5.0000 mg/h | INHALATION_SOLUTION | Freq: Once | RESPIRATORY_TRACT | Status: DC
  Filled 2016-07-04: qty 20

## 2016-07-04 MED ORDER — GUAIFENESIN ER 600 MG PO TB12
600.0000 mg | ORAL_TABLET | Freq: Two times a day (BID) | ORAL | Status: DC
  Administered 2016-07-04 – 2016-07-05 (×3): 600 mg via ORAL
  Filled 2016-07-04 (×3): qty 1

## 2016-07-04 MED ORDER — METHYLPREDNISOLONE SODIUM SUCC 125 MG IJ SOLR
125.0000 mg | Freq: Once | INTRAMUSCULAR | Status: AC
  Administered 2016-07-04: 125 mg via INTRAVENOUS
  Filled 2016-07-04: qty 2

## 2016-07-04 MED ORDER — SODIUM CHLORIDE 0.9% FLUSH
3.0000 mL | Freq: Two times a day (BID) | INTRAVENOUS | Status: DC
  Administered 2016-07-04 (×2): 3 mL via INTRAVENOUS

## 2016-07-04 MED ORDER — BUDESONIDE 0.25 MG/2ML IN SUSP
0.2500 mg | Freq: Two times a day (BID) | RESPIRATORY_TRACT | Status: DC
  Administered 2016-07-04 – 2016-07-05 (×3): 0.25 mg via RESPIRATORY_TRACT
  Filled 2016-07-04 (×3): qty 2

## 2016-07-04 MED ORDER — ENOXAPARIN SODIUM 40 MG/0.4ML ~~LOC~~ SOLN
40.0000 mg | SUBCUTANEOUS | Status: DC
  Administered 2016-07-04: 40 mg via SUBCUTANEOUS

## 2016-07-04 MED ORDER — DIPHENHYDRAMINE HCL 25 MG PO CAPS: 50.0000 mg | ORAL_CAPSULE | Freq: Four times a day (QID) | ORAL | Status: DC | PRN

## 2016-07-04 MED ORDER — CODEINE SULFATE 30 MG PO TABS
15.0000 mg | ORAL_TABLET | Freq: Four times a day (QID) | ORAL | Status: DC | PRN
  Administered 2016-07-04: 15 mg via ORAL
  Filled 2016-07-04 (×2): qty 1

## 2016-07-04 MED ORDER — IPRATROPIUM-ALBUTEROL 0.5-2.5 (3) MG/3ML IN SOLN
3.0000 mL | Freq: Once | RESPIRATORY_TRACT | Status: AC
  Administered 2016-07-04: 3 mL via RESPIRATORY_TRACT

## 2016-07-04 MED ORDER — POTASSIUM CHLORIDE IN NACL 20-0.9 MEQ/L-% IV SOLN
INTRAVENOUS | Status: DC
  Administered 2016-07-04 – 2016-07-05 (×2): via INTRAVENOUS
  Filled 2016-07-04 (×3): qty 1000

## 2016-07-04 MED ORDER — ONDANSETRON HCL 4 MG/2ML IJ SOLN: 4.0000 mg | Freq: Four times a day (QID) | INTRAMUSCULAR | Status: DC | PRN

## 2016-07-04 MED ORDER — IPRATROPIUM-ALBUTEROL 0.5-2.5 (3) MG/3ML IN SOLN
RESPIRATORY_TRACT | Status: AC
  Filled 2016-07-04: qty 9

## 2016-07-04 MED ORDER — ALBUTEROL SULFATE (2.5 MG/3ML) 0.083% IN NEBU
INHALATION_SOLUTION | RESPIRATORY_TRACT | Status: AC
  Administered 2016-07-04: 5 mg
  Filled 2016-07-04: qty 6

## 2016-07-04 MED ORDER — SODIUM CHLORIDE 0.9% FLUSH: 3.0000 mL | INTRAVENOUS | Status: DC | PRN

## 2016-07-04 MED ORDER — ONDANSETRON HCL 4 MG PO TABS: 4.0000 mg | ORAL_TABLET | Freq: Four times a day (QID) | ORAL | Status: DC | PRN

## 2016-07-04 MED ORDER — DEXTROSE 5 % IV SOLN
500.0000 mg | Freq: Every day | INTRAVENOUS | Status: DC
  Administered 2016-07-04: 500 mg via INTRAVENOUS
  Filled 2016-07-04 (×2): qty 500

## 2016-07-04 MED ORDER — IPRATROPIUM BROMIDE 0.02 % IN SOLN
0.5000 mg | RESPIRATORY_TRACT | Status: DC
  Administered 2016-07-04 (×2): 0.5 mg via RESPIRATORY_TRACT
  Filled 2016-07-04 (×2): qty 2.5

## 2016-07-04 MED ORDER — METHYLPREDNISOLONE SODIUM SUCC 125 MG IJ SOLR
60.0000 mg | Freq: Four times a day (QID) | INTRAMUSCULAR | Status: DC
  Administered 2016-07-04 – 2016-07-05 (×4): 60 mg via INTRAVENOUS
  Filled 2016-07-04 (×2): qty 2

## 2016-07-04 MED ORDER — SODIUM CHLORIDE 0.9 % IV SOLN: 250.0000 mL | INTRAVENOUS | Status: DC | PRN

## 2016-07-04 MED ORDER — GUAIFENESIN-CODEINE 100-10 MG/5ML PO SOLN
5.0000 mL | Freq: Four times a day (QID) | ORAL | Status: DC | PRN
  Administered 2016-07-04 – 2016-07-05 (×3): 5 mL via ORAL
  Filled 2016-07-04 (×3): qty 5

## 2016-07-04 MED ORDER — LIDOCAINE-PRILOCAINE 2.5-2.5 % EX CREA
TOPICAL_CREAM | CUTANEOUS | Status: DC | PRN
  Filled 2016-07-04: qty 5

## 2016-07-04 MED ORDER — NICOTINE 21 MG/24HR TD PT24
21.0000 mg | MEDICATED_PATCH | Freq: Every day | TRANSDERMAL | Status: DC
  Administered 2016-07-04 – 2016-07-05 (×2): 21 mg via TRANSDERMAL
  Filled 2016-07-04 (×2): qty 1

## 2016-07-04 MED ORDER — IPRATROPIUM-ALBUTEROL 0.5-2.5 (3) MG/3ML IN SOLN
3.0000 mL | RESPIRATORY_TRACT | Status: DC
  Administered 2016-07-04 – 2016-07-05 (×5): 3 mL via RESPIRATORY_TRACT
  Filled 2016-07-04 (×5): qty 3

## 2016-07-04 NOTE — Progress Notes (Signed)
Patient c/o back pain. Spoke to Dr. Seth Bake and she will put orders in.

## 2016-07-04 NOTE — ED Triage Notes (Signed)
Patient to ER for c/o shortness of breath. States she has been having issues since October, been diagnosed with bronchitis twice. Patient reports getting cat in September. Patient visibly short of breath upon entering lobby (refused wheelchair). Patient able to answer questions without difficulty, but again noticeably short of breath. Used inhaler at home prior to arrival. Patient does not have nebs at home. Patient also reports non-productive cough.

## 2016-07-04 NOTE — H&P (Signed)
[facility] [place] at [facility]   PATIENT NAME: Melissa Dean    MR#:  [id number]  DATE OF BIRTH:  Jul 05, 1970  DATE OF ADMISSION:  07/04/2016  PRIMARY CARE PHYSICIAN: No PCP Per Patient   REQUESTING/REFERRING PHYSICIAN:   CHIEF COMPLAINT:   Chief Complaint  Patient presents with  . Shortness of Breath    HISTORY OF PRESENT ILLNESS: Melissa Dean  is a 46 y.o. female with , Wheezing, tightness in the lungs, shortness of breath. She was seen in emergency room at least twice and was given antibiotics and steroids, however, did not improve significantly and in fact worsened over the past couple of days, she admits of a coughing paroxysm and would become very short of breath even nausea with dry heaving. She presented to the hospital and her O2 sats were found to be low, 86% on room air. Hospitalist services were contacted for admission. The patient complains of back pain, chest pain with cough  PAST MEDICAL HISTORY:  History reviewed. No pertinent past medical history.  PAST SURGICAL HISTORY: Past Surgical History:  Procedure Laterality Date  . CESAREAN SECTION    . CESAREAN SECTION    . CHOLECYSTECTOMY    . TONSILLECTOMY    . TUBAL LIGATION      SOCIAL HISTORY:  Social History  Substance Use Topics  . Smoking status: Current Every Day Smoker    Packs/day: 0.50    Years: 20.00  . Smokeless tobacco: Never Used  . Alcohol use No    FAMILY HISTORY:  Family History  Problem Relation Age of Onset  . Heart disease Father   . Hypertension Father   . Diabetes Father   . Cancer Father     lung cancer  . Diabetes Brother   . Hypertension Paternal Grandfather   . Diabetes Paternal Grandfather   . Thyroid disease      DRUG ALLERGIES: No Known Allergies  Review of Systems  Constitutional: Negative for chills, fever and weight loss.  HENT: Negative for congestion.   Eyes: Negative for blurred vision and double vision.  Respiratory:  Positive for cough, shortness of breath and wheezing. Negative for sputum production.   Cardiovascular: Positive for chest pain and palpitations. Negative for orthopnea, leg swelling and PND.  Gastrointestinal: Positive for nausea. Negative for abdominal pain, blood in stool, constipation, diarrhea and vomiting.  Genitourinary: Negative for dysuria, frequency, hematuria and urgency.  Musculoskeletal: Positive for back pain. Negative for falls.  Neurological: Negative for dizziness, tremors, focal weakness and headaches.  Endo/Heme/Allergies: Does not bruise/bleed easily.  Psychiatric/Behavioral: Negative for depression. The patient does not have insomnia.     MEDICATIONS AT HOME:  Prior to Admission medications   Medication Sig Start Date End Date Taking? Authorizing Provider  albuterol (PROVENTIL HFA;VENTOLIN HFA) 108 (90 Base) MCG/ACT inhaler Inhale 2 puffs into the lungs every 6 (six) hours as needed for wheezing or shortness of breath. 04/28/16  Yes Myrna Blazer, MD  diphenhydrAMINE (BENADRYL) 25 MG tablet Take 50 mg by mouth every 6 (six) hours as needed.   Yes Historical Provider, MD  guaiFENesin (MUCINEX) 600 MG 12 hr tablet Take by mouth 2 (two) times daily.   Yes Historical Provider, MD  guaiFENesin-codeine 100-10 MG/5ML syrup Take 5 mLs by mouth every 6 (six) hours as needed for cough. 05/03/16  Yes Minna Antis, MD  citalopram (CELEXA) 20 MG tablet Take 1 tablet (20 mg total) by mouth daily. Patient not taking: Reported on 07/04/2016  07/18/14   Doris Cheadle, MD  nicotine (NICODERM CQ) 21 mg/24hr patch Place 1 patch (21 mg total) onto the skin daily. Patient not taking: Reported on 01/16/2016 07/18/14   Doris Cheadle, MD  predniSONE (DELTASONE) 50 MG tablet Take 1 tab po daily x5 days Patient not taking: Reported on 07/04/2016 04/28/16   Myrna Blazer, MD      PHYSICAL EXAMINATION:   VITAL SIGNS: Blood pressure 102/64, pulse 71, temperature 97.9 F (36.6 C),  temperature source Oral, resp. rate 16, height  (1.575 m), weight 54.4 kg (120 lb), last menstrual period 06/13/2016, SpO2 94 %.  GENERAL:  46 y.o.-year-old patient lying in the bed In mild to moderate respiratory distress, tachypneic, uncomfortable, tremulous, anxious appearance.  EYES: Pupils equal, round, reactive to light and accommodation. No scleral icterus. Extraocular muscles intact.  HEENT: Head atraumatic, normocephalic. Oropharynx and nasopharynx clear. Dry oral mucosa NECK:  Supple, no jugular venous distention. No thyroid enlargement, no tenderness.  LUNGS: Diminished breath sounds bilaterally, no wheezing, scattered anterior lung field rales,rhonchi , but no crepitations. Intermittent use of accessory muscles of respiration, the speech and movements.  CARDIOVASCULAR: S1, S2 normal. No murmurs, rubs, or gallops.  ABDOMEN: Soft, nontender, nondistended. Bowel sounds present. No organomegaly or mass.  EXTREMITIES: No pedal edema, cyanosis, or clubbing.  NEUROLOGIC: Cranial nerves II through XII are intact. Muscle strength 5/5 in all extremities. Sensation intact. Gait not checked.  PSYCHIATRIC: The patient is alert and oriented x 3.  SKIN: No obvious rash, lesion, or ulcer.   LABORATORY PANEL:   CBC  Recent Labs Lab 07/04/16 0736  WBC 7.8  HGB 15.2  HCT 44.6  PLT 191  MCV 93.7  MCH 31.8  MCHC 33.9  RDW 14.4   ------------------------------------------------------------------------------------------------------------------  Chemistries   Recent Labs Lab 07/04/16 0736  NA 139  K 3.8  CL 107  CO2 26  GLUCOSE 99  BUN 9  CREATININE 0.78  CALCIUM 9.1   ------------------------------------------------------------------------------------------------------------------  Cardiac Enzymes  Recent Labs Lab 07/04/16 0736  TROPONINI <0.03    ------------------------------------------------------------------------------------------------------------------  RADIOLOGY: Dg Chest 2 View  Result Date: 07/04/2016 CLINICAL DATA:  Shortness of breath. EXAM: CHEST  2 VIEW COMPARISON:  May 03, 2016 FINDINGS: Bilateral nipple shadows. An ill defined nodular density is projected over the left apex but may be artifactual as it was not seen on the April 28, 2016 or May 03, 2016 comparisons. The cardiomediastinal silhouette is stable. No focal infiltrates or other acute abnormalities identified. IMPRESSION: 1. Ill defined nodular density projected over the left apex may be artifactual as it was not seen on recent comparisons. This could be something on the patient. Recommend short-term follow-up to ensure resolution. No other acute abnormalities. Electronically Signed   By: Gerome Sam III M.D   On: 07/04/2016 08:02    EKG: Orders placed or performed during the hospital encounter of 07/04/16  . ED EKG  . ED EKG  . ED EKG  . ED EKG   EKG in the emergency room revealed sinus rhythm at 73 bpm, anteroseptal infarct. EKG criteria, old, nonspecific repolarization abnormalities diffusely, no acute CT changes   IMPRESSION AND PLAN:  Active Problems:   Acute respiratory failure with hypoxia (HCC)   COPD with acute exacerbation (HCC)   Acute bronchitis  #1. Acute respiratory failure with hypoxia, diminished medical floor, continue oxygen therapy, wean off oxygen as tolerated, patient is not on oxygen at home #2. COPD exacerbation due to acute bronchitis, initiate steroids systemically  and inhaled, continue nebulizing therapy, follow closely #3. Acute bronchitis, start patient on Zithromax, get sputum cultures if possible, continue cough medications #4. Tobacco abuse. Counseling, discussed with patient for 4 minutes, nicotine replacement therapy is going to be initiated, patient is agreeable #5. Hypotension, patient looks clinically  dehydrated, initiate her on low rate IV fluids  All the records are reviewed and case discussed with ED provider. Management plans discussed with the patient, family and they are in agreement.  CODE STATUS: Code Status History    Date Active Date Inactive Code Status Order ID Comments User Context   07/06/2014  1:15 AM 07/06/2014 10:07 PM Full Code [id number]  Floydene Flock, MD ED       TOTAL TIME TAKING CARE OF THIS PATIENT: 55 minutes.    Katharina Caper M.D on 07/04/2016 at 11:01 AM  Between 7am to 6pm - Pager - [phone] After 6pm go to [email or web address] - password [email or web address] [facility]  [facility]  Office  [phone]  CC: Primary care physician; No PCP Per Patient

## 2016-07-04 NOTE — ED Notes (Signed)
Patient transported to X-ray 

## 2016-07-04 NOTE — ED Provider Notes (Signed)
[facility] Emergency Department Provider Note ____________________________________________   I have reviewed the triage vital signs and the triage nursing note.  HISTORY  Chief Complaint Shortness of Breath   Historian Patient  HPI Melissa Dean is a 46 y.o. female with history of smoking, as well as recent several episodes of "bronchitis "presents today with several months of on and off trouble breathing and shortness of breath and wheezing, worse over the past week. Chest tightness without chest pain. No pleuritic chest pain. Moderate to severe shortness of breath and wheezing. No nausea or vomiting. No abdominal pain. No headache or confusion altered mental status.  She is still smoker, although states that she has cut back.  Patient thinks that her symptoms got worse in October after getting a CAT in September and is wondering if maybe allergies are playing a role.    History reviewed. No pertinent past medical history.  Patient Active Problem List   Diagnosis Date Noted  . Anxiety state 07/18/2014  . Tobacco abuse 07/18/2014  . Abnormal TSH 07/18/2014  . Atypical chest pain 07/18/2014  . Chest pain 07/06/2014    Past Surgical History:  Procedure Laterality Date  . CESAREAN SECTION    . CESAREAN SECTION    . CHOLECYSTECTOMY    . TONSILLECTOMY    . TUBAL LIGATION      Prior to Admission medications   Medication Sig Start Date End Date Taking? Authorizing Provider  albuterol (PROVENTIL HFA;VENTOLIN HFA) 108 (90 Base) MCG/ACT inhaler Inhale 2 puffs into the lungs every 6 (six) hours as needed for wheezing or shortness of breath. 04/28/16   Myrna Blazer, MD  citalopram (CELEXA) 20 MG tablet Take 1 tablet (20 mg total) by mouth daily. 07/18/14   Doris Cheadle, MD  guaiFENesin-codeine 100-10 MG/5ML syrup Take 5 mLs by mouth every 6 (six) hours as needed for cough. 05/03/16   Minna Antis, MD  nicotine (NICODERM CQ) 21 mg/24hr patch  Place 1 patch (21 mg total) onto the skin daily. Patient not taking: Reported on 01/16/2016 07/18/14   Doris Cheadle, MD  predniSONE (DELTASONE) 50 MG tablet Take 1 tab po daily x5 days 04/28/16   Myrna Blazer, MD    No Known Allergies  Family History  Problem Relation Age of Onset  . Heart disease Father   . Hypertension Father   . Diabetes Father   . Cancer Father     lung cancer  . Diabetes Brother   . Hypertension Paternal Grandfather   . Diabetes Paternal Grandfather   . Thyroid disease      Social History Social History  Substance Use Topics  . Smoking status: Current Every Day Smoker    Packs/day: 0.50    Years: 20.00  . Smokeless tobacco: Never Used  . Alcohol use No    Review of Systems  Constitutional: Negative for fever. Eyes: Negative for visual changes. ENT: Negative for sore throat. Cardiovascular: Negative for chest pain. Respiratory: Positive for shortness of breath. Gastrointestinal: Negative for abdominal pain, vomiting and diarrhea. Genitourinary: Negative for dysuria. Musculoskeletal: Negative for back pain. Skin: Negative for rash. Neurological: Negative for headache. 10 point Review of Systems otherwise negative ____________________________________________   PHYSICAL EXAM:  VITAL SIGNS: ED Triage Vitals  Enc Vitals Group     BP 07/04/16 0730 (!) 153/77     Pulse Rate 07/04/16 0730 64     Resp 07/04/16 0730 (!) 26     Temp 07/04/16 0730 97.9 F (36.6 C)  Temp Source 07/04/16 0730 Oral     SpO2 07/04/16 0730 (!) 86 %     Weight 07/04/16 0729 120 lb (54.4 kg)     Height 07/04/16 0729  (1.575 m)     Head Circumference --      Peak Flow --      Pain Score --      Pain Loc --      Pain Edu? --      Excl. in GC? --      Constitutional: Alert and oriented. Dyspnea and tachypnea, moderate respiratory distress. HEENT   Head: Normocephalic and atraumatic.      Eyes: Conjunctivae are normal. PERRL. Normal  extraocular movements.      Ears:         Nose: No congestion/rhinnorhea.   Mouth/Throat: Mucous membranes are moist.   Neck: No stridor. Cardiovascular/Chest: Normal rate, regular rhythm.  No murmurs, rubs, or gallops. Respiratory: Tachypnea with retractions. Extreme wheezing all fields. Mild rhonchi posteriorly especially. Gastrointestinal: Soft. No distention, no guarding, no rebound. Nontender.    Genitourinary/rectal:Deferred Musculoskeletal: Nontender with normal range of motion in all extremities. No joint effusions.  No lower extremity tenderness.  No edema. Neurologic:  Normal speech and language. No gross or focal neurologic deficits are appreciated. Skin:  Skin is warm, dry and intact. No rash noted. Psychiatric: Mood and affect are normal. Speech and behavior are normal. Patient exhibits appropriate insight and judgment.   ____________________________________________  LABS (pertinent positives/negatives)  Labs Reviewed  BASIC METABOLIC PANEL  CBC  TROPONIN I    ____________________________________________    EKG I, Governor Rooks, MD, the attending physician have personally viewed and interpreted all ECGs.  73 bpm. Normal sinus rhythm. Narrow QRS. Normal axis. Nonspecific ST and T-wave ____________________________________________  RADIOLOGY All Xrays were viewed by me. Imaging interpreted by Radiologist.  Chest x-ray portable:  FINDINGS: Bilateral nipple shadows. An ill defined nodular density is projected over the left apex but may be artifactual as it was not seen on the April 28, 2016 or May 03, 2016 comparisons. The cardiomediastinal silhouette is stable. No focal infiltrates or other acute abnormalities identified.  IMPRESSION: 1. Ill defined nodular density projected over the left apex may be artifactual as it was not seen on recent comparisons. This could be something on the patient. Recommend short-term follow-up to ensure resolution. No  other acute abnormalities. __________________________________________  PROCEDURES  Procedure(s) performed: None  Critical Care performed: None  ____________________________________________   ED COURSE / ASSESSMENT AND PLAN  Pertinent labs & imaging results that were available during my care of the patient were reviewed by me and considered in my medical decision making (see chart for details).   Ms. Sorci arrived with moderate respiratory distress, extreme wheezing. Patient on 3 DuoNeb times, with mild improvement per her, still tachypneic and extremely wheezy. She is given Solu-Medrol.  She is hypoxic upon arrival 86% on room air.  We discussed hospital admission for bronchitis with hypoxia, possibly asthma or COPD given smoking history although she has not been diagnosed previously.  We discussed that she is likely going to need ultimately further evaluation with pulmonary function testing and pulmonology consultation, likely as an outpatient.  After three duoneb, improved air movement and wheezing, but when I talk to her she starts bronchospasm again.  Placed on continuous neb.   Low suspicion for cardiac etiology of dyspnea.  Low suspicion for PE.  Clinically bronchospasm, undiagnosed asthma or copd.    CONSULTATIONS:  Hospitalist for admission.   Patient / Family / Caregiver informed of clinical course, medical decision-making process, and agree with plan.    ___________________________________________   FINAL CLINICAL IMPRESSION(S) / ED DIAGNOSES   Final diagnoses:  Bronchospasm with bronchitis, acute  SOB (shortness of breath)  Hypoxia              Note: This dictation was prepared with Dragon dictation. Any transcriptional errors that result from this process are unintentional    Governor Rooks, MD 07/04/16 [phone]

## 2016-07-05 LAB — HIV ANTIBODY (ROUTINE TESTING W REFLEX): HIV Screen 4th Generation wRfx: NONREACTIVE

## 2016-07-05 LAB — GLUCOSE, CAPILLARY: GLUCOSE-CAPILLARY: 109 mg/dL — AB (ref 65–99)

## 2016-07-05 MED ORDER — METHYLPREDNISOLONE SODIUM SUCC 40 MG IJ SOLR
40.0000 mg | Freq: Four times a day (QID) | INTRAMUSCULAR | Status: DC
  Administered 2016-07-05: 40 mg via INTRAVENOUS
  Filled 2016-07-05: qty 1

## 2016-07-05 MED ORDER — PREDNISONE 10 MG PO TABS: ORAL_TABLET | ORAL | 0 refills | Status: DC

## 2016-07-05 MED ORDER — AZITHROMYCIN 500 MG PO TABS: 500.0000 mg | ORAL_TABLET | Freq: Every day | ORAL | 0 refills | Status: DC

## 2016-07-05 MED ORDER — BENZONATATE 100 MG PO CAPS: 100.0000 mg | ORAL_CAPSULE | Freq: Three times a day (TID) | ORAL | 0 refills | Status: DC | PRN

## 2016-07-05 MED ORDER — BENZONATATE 100 MG PO CAPS: 100.0000 mg | ORAL_CAPSULE | Freq: Three times a day (TID) | ORAL | Status: DC | PRN

## 2016-07-05 MED ORDER — AZITHROMYCIN 500 MG PO TABS
500.0000 mg | ORAL_TABLET | Freq: Every day | ORAL | Status: DC
  Administered 2016-07-05: 500 mg via ORAL
  Filled 2016-07-05: qty 1

## 2016-07-05 MED ORDER — NICOTINE 21 MG/24HR TD PT24: 21.0000 mg | MEDICATED_PATCH | Freq: Every day | TRANSDERMAL | 0 refills | Status: DC

## 2016-07-05 NOTE — Progress Notes (Signed)
Patient is being discharged to home. IV removed and belongings packed. Discharge and Rx instructions given and patient acknowledged understanding. CM provided list of resources for outpatient clinics and prescription coupons.

## 2016-07-05 NOTE — Discharge Instructions (Signed)
Smoking cessation  

## 2016-07-05 NOTE — Progress Notes (Addendum)
[facility] [place] at [facility]        Melissa Dean was admitted to the Hospital on 07/04/2016 and Discharged  07/05/2016 and should be excused from work/school   for 4  days starting 07/04/2016 , may return to work/school without any restrictions.  Shaune Pollack M.D on 07/05/2016,at 10:36 AM  [facility] [place] at [facility]  [phone]

## 2016-07-05 NOTE — Care Management Note (Signed)
Case Management Note  Patient Details  Name: Melissa Dean MRN: [id number] Date of Birth: Jul 14, 1970  Subjective/Objective:         Provided with coupons from [employer] for [employer], and list of local clinics which serve the uninsured per Ms Sconyers has no PCP.            Action/Plan:   Expected Discharge Date:  07/05/16               Expected Discharge Plan:     In-House Referral:     Discharge planning Services     Post Acute Care Choice:    Choice offered to:     DME Arranged:    DME Agency:     HH Arranged:    HH Agency:     Status of Service:     If discussed at [employer], dates discussed:    Additional Comments:  Sharmin Foulk A, RN 07/05/2016, 11:21 AM

## 2016-07-05 NOTE — Discharge Summary (Signed)
[facility] [place] at [facility]   PATIENT NAME: Melissa Dean    MR#:  [id number]  DATE OF BIRTH:  01-11-1971  DATE OF ADMISSION:  07/04/2016   ADMITTING PHYSICIAN: Katharina Caper, MD  DATE OF DISCHARGE: 07/05/2016 PRIMARY CARE PHYSICIAN: No PCP Per Patient   ADMISSION DIAGNOSIS:  SOB (shortness of breath) [R06.02] Hypoxia [R09.02] Bronchospasm with bronchitis, acute [J20.9] DISCHARGE DIAGNOSIS:  Active Problems:   Acute respiratory failure with hypoxia (HCC)   COPD with acute exacerbation (HCC)   Acute bronchitis  SECONDARY DIAGNOSIS:  History reviewed. No pertinent past medical history. HOSPITAL COURSE:   #1. Acute respiratory failure with hypoxia,  Improved with oxygen therapy and NEB.  #2. COPD exacerbation due to acute bronchitis, Taper steroids systemically and treated with nebulizer.   #3. Acute bronchitis, continue Zithromax, Tessalon when necessary. #4. Tobacco abuse. Counseling, discussed with patient for 4 minutes, nicotine replacement therapy  #5. Hypotension, improved with IV fluids  DISCHARGE CONDITIONS:  Stable, discharge to home today. CONSULTS OBTAINED:   DRUG ALLERGIES:  No Known Allergies DISCHARGE MEDICATIONS:   Allergies as of 07/05/2016   No Known Allergies     Medication List    TAKE these medications   albuterol 108 (90 Base) MCG/ACT inhaler Commonly known as:  PROVENTIL HFA;VENTOLIN HFA Inhale 2 puffs into the lungs every 6 (six) hours as needed for wheezing or shortness of breath.   azithromycin 500 MG tablet Commonly known as:  ZITHROMAX Take 1 tablet (500 mg total) by mouth daily.   benzonatate 100 MG capsule Commonly known as:  TESSALON Take 1 capsule (100 mg total) by mouth 3 (three) times daily as needed for cough.   citalopram 20 MG tablet Commonly known as:  CELEXA Take 1 tablet (20 mg total) by mouth daily.   diphenhydrAMINE 25 MG tablet Commonly known as:  BENADRYL Take 50 mg by mouth every 6  (six) hours as needed.   guaiFENesin 600 MG 12 hr tablet Commonly known as:  MUCINEX Take by mouth 2 (two) times daily.   guaiFENesin-codeine 100-10 MG/5ML syrup Take 5 mLs by mouth every 6 (six) hours as needed for cough.   nicotine 21 mg/24hr patch Commonly known as:  NICODERM CQ Place 1 patch (21 mg total) onto the skin daily.   predniSONE 10 MG tablet Commonly known as:  DELTASONE 40 mg po daily for 2 days, 20 mg po daily for 2 days, 10 mg po daily for 3 days. What changed:  medication strength  additional instructions        DISCHARGE INSTRUCTIONS:  See AVS.  If you experience worsening of your admission symptoms, develop shortness of breath, life threatening emergency, suicidal or homicidal thoughts you must seek medical attention immediately by calling 911 or calling your MD immediately  if symptoms less severe.  You Must read complete instructions/literature along with all the possible adverse reactions/side effects for all the Medicines you take and that have been prescribed to you. Take any new Medicines after you have completely understood and accpet all the possible adverse reactions/side effects.   Please note  You were cared for by a hospitalist during your hospital stay. If you have any questions about your discharge medications or the care you received while you were in the hospital after you are discharged, you can call the unit and asked to speak with the hospitalist on call if the hospitalist that took care of you is not available. Once you are discharged, your primary  care physician will handle any further medical issues. Please note that NO REFILLS for any discharge medications will be authorized once you are discharged, as it is imperative that you return to your primary care physician (or establish a relationship with a primary care physician if you do not have one) for your aftercare needs so that they can reassess your need for medications and monitor your  lab values.    On the day of Discharge:  VITAL SIGNS:  Blood pressure 90/60, pulse 95, temperature 98 F (36.7 C), temperature source Oral, resp. rate 19, height  (1.575 m), weight 120 lb (54.4 kg), last menstrual period 06/13/2016, SpO2 95 %. PHYSICAL EXAMINATION:  GENERAL:  46 y.o.-year-old patient lying in the bed with no acute distress.  EYES: Pupils equal, round, reactive to light and accommodation. No scleral icterus. Extraocular muscles intact.  HEENT: Head atraumatic, normocephalic. Oropharynx and nasopharynx clear.  NECK:  Supple, no jugular venous distention. No thyroid enlargement, no tenderness.  LUNGS: Normal breath sounds bilaterally, no wheezing, rales,rhonchi or crepitation. No use of accessory muscles of respiration.  CARDIOVASCULAR: S1, S2 normal. No murmurs, rubs, or gallops.  ABDOMEN: Soft, non-tender, non-distended. Bowel sounds present. No organomegaly or mass.  EXTREMITIES: No pedal edema, cyanosis, or clubbing.  NEUROLOGIC: Cranial nerves II through XII are intact. Muscle strength 5/5 in all extremities. Sensation intact. Gait not checked.  PSYCHIATRIC: The patient is alert and oriented x 3.  SKIN: No obvious rash, lesion, or ulcer.  DATA REVIEW:   CBC  Recent Labs Lab 07/04/16 0736  WBC 7.8  HGB 15.2  HCT 44.6  PLT 191    Chemistries   Recent Labs Lab 07/04/16 0736  NA 139  K 3.8  CL 107  CO2 26  GLUCOSE 99  BUN 9  CREATININE 0.78  CALCIUM 9.1     Microbiology Results  Results for orders placed or performed in visit on 02/06/14  Urine culture     Status: None   Collection Time: 02/06/14  1:10 PM  Result Value Ref Range Status   Micro Text Report   Final       SOURCE: CLEAN CATCH    ORGANISM 1                50,000 CFU/ML KLEBSIELLA PNEUMONIAE SSP PNEUMONIAE   COMMENT                   WITH MIXED-BACTERIAL-ORGANISMS   ANTIBIOTIC                    ORG#1     AMPICILLIN                    R         CEFAZOLIN                      S         CEFOXITIN                     S         CEFTRIAXONE                   S         CIPROFLOXACIN                 S         GENTAMICIN  S         IMIPENEM                      S         LEVOFLOXACIN                  S         NITROFURANTOIN                S         TRIMETHOPRIM/SULFAMETHOXAZOLE S             RADIOLOGY:  No results found.   Management plans discussed with the patient, family and they are in agreement.  CODE STATUS: Full Code   TOTAL TIME TAKING CARE OF THIS PATIENT: 33 minutes.    Shaune Pollack M.D on 07/05/2016 at 11:43 AM  Between 7am to 6pm - Pager - [phone]  After 6pm go to [email or web address] - Scientist, research (life sciences) [facility]  Office  [phone]  CC: Primary care physician; No PCP Per Patient   Note: This dictation was prepared with Dragon dictation along with smaller phrase technology. Any transcriptional errors that result from this process are unintentional.

## 2016-07-18 ENCOUNTER — Emergency Department
Admission: EM | Admit: 2016-07-18 | Discharge: 2016-07-19 | Disposition: A | Discharge status: Home / Self Care | Payer: Self-pay | Attending: Emergency Medicine | Admitting: Emergency Medicine

## 2016-07-18 ENCOUNTER — Emergency Department: Payer: Self-pay

## 2016-07-18 ENCOUNTER — Encounter: Payer: Self-pay | Admitting: Emergency Medicine

## 2016-07-18 DIAGNOSIS — J441 Chronic obstructive pulmonary disease with (acute) exacerbation: Secondary | ICD-10-CM | POA: Insufficient documentation

## 2016-07-18 DIAGNOSIS — Z87891 Personal history of nicotine dependence: Secondary | ICD-10-CM | POA: Insufficient documentation

## 2016-07-18 DIAGNOSIS — Z79899 Other long term (current) drug therapy: Secondary | ICD-10-CM | POA: Insufficient documentation

## 2016-07-18 HISTORY — DX: Bronchitis, not specified as acute or chronic: J40

## 2016-07-18 LAB — BASIC METABOLIC PANEL
Anion gap: 7 (ref 5–15)
BUN: 13 mg/dL (ref 6–20)
CO2: 25 mmol/L (ref 22–32)
CREATININE: 0.76 mg/dL (ref 0.44–1.00)
Calcium: 9.2 mg/dL (ref 8.9–10.3)
Chloride: 106 mmol/L (ref 101–111)
GFR calc Af Amer: >60 mL/min (ref >60)
GFR calc non Af Amer: >60 mL/min (ref >60)
Glucose, Bld: 104 mg/dL — ABNORMAL HIGH (ref 65–99)
POTASSIUM: 3.6 mmol/L (ref 3.5–5.1)
SODIUM: 138 mmol/L (ref 135–145)

## 2016-07-18 LAB — CBC WITH DIFFERENTIAL/PLATELET
Basophils Absolute: 0.1 10*3/uL (ref 0–0.1)
Basophils Relative: 1 %
EOS ABS: 0.8 10*3/uL — AB (ref 0–0.7)
EOS PCT: 5 %
HCT: 42.7 % (ref 35.0–47.0)
Hemoglobin: 14.4 g/dL (ref 12.0–16.0)
LYMPHS ABS: 3.5 10*3/uL (ref 1.0–3.6)
Lymphocytes Relative: 23 %
MCH: 30.9 pg (ref 26.0–34.0)
MCHC: 33.8 g/dL (ref 32.0–36.0)
MCV: 91.4 fL (ref 80.0–100.0)
Monocytes Absolute: 1.5 10*3/uL — ABNORMAL HIGH (ref 0.2–0.9)
Monocytes Relative: 10 %
Neutro Abs: 9.6 10*3/uL — ABNORMAL HIGH (ref 1.4–6.5)
Neutrophils Relative %: 61 %
PLATELETS: 285 10*3/uL (ref 150–440)
RBC: 4.67 MIL/uL (ref 3.80–5.20)
RDW: 14.4 % (ref 11.5–14.5)
WBC: 15.5 10*3/uL — AB (ref 3.6–11.0)

## 2016-07-18 LAB — TROPONIN I

## 2016-07-18 MED ORDER — DEXTROSE 5 % IV SOLN
500.0000 mg | Freq: Once | INTRAVENOUS | Status: AC
  Administered 2016-07-18: 500 mg via INTRAVENOUS
  Filled 2016-07-18: qty 500

## 2016-07-18 MED ORDER — METHYLPREDNISOLONE SODIUM SUCC 125 MG IJ SOLR
125.0000 mg | Freq: Once | INTRAMUSCULAR | Status: AC
  Administered 2016-07-18: 125 mg via INTRAVENOUS
  Filled 2016-07-18: qty 2

## 2016-07-18 MED ORDER — IPRATROPIUM-ALBUTEROL 0.5-2.5 (3) MG/3ML IN SOLN
9.0000 mL | Freq: Once | RESPIRATORY_TRACT | Status: AC
  Administered 2016-07-18: 9 mL via RESPIRATORY_TRACT
  Filled 2016-07-18: qty 9

## 2016-07-18 NOTE — ED Triage Notes (Signed)
Patient with complaint of shortness of breath times one week that became worse last night. Patient states that she has been using her inhaler every hour with no improvement. Patient states that she was admitted to the hospital about 2 weeks ago with the same symptoms and was diagnosed with bronchitis.

## 2016-07-18 NOTE — ED Provider Notes (Signed)
Clinical Course as of Jul 19 204  Sat Jul 18, 2016  2332 Assuming care from Dr. Pershing Proud.  In short, Melissa Dean is a 46 y.o. female with a chief complaint of probable COPD exacerbation.  Refer to the original H&P for additional details.  The current plan of care is to reassess after treatments.   [CF]  Sun Jul 19, 2016  0205 The patient feels much better after her breathing treatments.  Upon auscultation she still is expiratory wheezing but she is not in any distress and not using any accessory muscles.  Her vitals have been stable throughout although she does have some mild tachypnea but she has not retracting.  She is comfortable with the plan for outpatient follow-up.  She has no doctor at this point so I gave her the name and number of [facility] as well as the patient navigator to help her get set up with a primary care provider appropriate for her.  I gave my usual and customary return precautions.     [CF]    Clinical Course User Index [CF] Loleta Rose, MD      Loleta Rose, MD 07/19/16 [phone]

## 2016-07-18 NOTE — ED Notes (Signed)
XR at bedside

## 2016-07-19 ENCOUNTER — Encounter: Payer: Self-pay | Admitting: Emergency Medicine

## 2016-07-19 MED ORDER — SODIUM CHLORIDE 0.9 % IV BOLUS (SEPSIS)
1000.0000 mL | Freq: Once | INTRAVENOUS | Status: AC
  Administered 2016-07-19: 1000 mL via INTRAVENOUS

## 2016-07-19 MED ORDER — HYDROCODONE-HOMATROPINE 5-1.5 MG/5ML PO SYRP: 5.0000 mL | ORAL_SOLUTION | Freq: Four times a day (QID) | ORAL | 0 refills | Status: DC | PRN

## 2016-07-19 MED ORDER — AZITHROMYCIN 250 MG PO TABS: ORAL_TABLET | ORAL | 0 refills | Status: DC

## 2016-07-19 MED ORDER — ALBUTEROL SULFATE HFA 108 (90 BASE) MCG/ACT IN AERS: INHALATION_SPRAY | RESPIRATORY_TRACT | 1 refills | Status: DC

## 2016-07-19 MED ORDER — PREDNISONE 20 MG PO TABS: 60.0000 mg | ORAL_TABLET | Freq: Every day | ORAL | 0 refills | Status: DC

## 2016-07-19 NOTE — ED Provider Notes (Signed)
[facility] Emergency Department Provider Note  ____________________________________________   First MD Initiated Contact with Patient 07/18/16 2132     (approximate)  I have reviewed the triage vital signs and the nursing notes.   HISTORY  Chief Complaint Shortness of Breath   HPI Melissa Dean is a 46 y.o. female with history of chronic bronchitis with a recent admission about 2 weeks ago was presenting to the emergency department today with shortness of breath worsening over the past week as well as cramping chest and back pain especially with coughing. She says that she finished her steroids and antibiotics about 5 days ago. Using her rescue inhaler multiple times over the past 24 hours. Denies being on any sort of maintenance medication for her COPD. Says that she also recently stopped smoking after her last hospital admission.   Past Medical History:  Diagnosis Date  . Bronchitis     Patient Active Problem List   Diagnosis Date Noted  . Acute respiratory failure with hypoxia (HCC) 07/04/2016  . COPD with acute exacerbation (HCC) 07/04/2016  . Acute bronchitis 07/04/2016  . Anxiety state 07/18/2014  . Tobacco abuse 07/18/2014  . Abnormal TSH 07/18/2014  . Atypical chest pain 07/18/2014  . Chest pain 07/06/2014    Past Surgical History:  Procedure Laterality Date  . CESAREAN SECTION    . CESAREAN SECTION    . CHOLECYSTECTOMY    . TONSILLECTOMY    . TUBAL LIGATION      Prior to Admission medications   Medication Sig Start Date End Date Taking? Authorizing Provider  albuterol (PROVENTIL HFA;VENTOLIN HFA) 108 (90 Base) MCG/ACT inhaler Inhale 2 puffs into the lungs every 6 (six) hours as needed for wheezing or shortness of breath. 04/28/16   Myrna Blazer, MD  azithromycin (ZITHROMAX) 500 MG tablet Take 1 tablet (500 mg total) by mouth daily. 07/05/16   Shaune Pollack, MD  benzonatate (TESSALON) 100 MG capsule Take 1 capsule (100 mg  total) by mouth 3 (three) times daily as needed for cough. 07/05/16   Shaune Pollack, MD  citalopram (CELEXA) 20 MG tablet Take 1 tablet (20 mg total) by mouth daily. Patient not taking: Reported on 07/04/2016 07/18/14   Doris Cheadle, MD  diphenhydrAMINE (BENADRYL) 25 MG tablet Take 50 mg by mouth every 6 (six) hours as needed.    Historical Provider, MD  guaiFENesin (MUCINEX) 600 MG 12 hr tablet Take by mouth 2 (two) times daily.    Historical Provider, MD  guaiFENesin-codeine 100-10 MG/5ML syrup Take 5 mLs by mouth every 6 (six) hours as needed for cough. 05/03/16   Minna Antis, MD  nicotine (NICODERM CQ) 21 mg/24hr patch Place 1 patch (21 mg total) onto the skin daily. 07/05/16   Shaune Pollack, MD  predniSONE (DELTASONE) 10 MG tablet 40 mg po daily for 2 days, 20 mg po daily for 2 days, 10 mg po daily for 3 days. 07/05/16   Shaune Pollack, MD    Allergies Patient has no known allergies.  Family History  Problem Relation Age of Onset  . Heart disease Father   . Hypertension Father   . Diabetes Father   . Cancer Father     lung cancer  . Diabetes Brother   . Hypertension Paternal Grandfather   . Diabetes Paternal Grandfather   . Thyroid disease      Social History Social History  Substance Use Topics  . Smoking status: Former Smoker    Packs/day: 0.50  Years: 20.00  . Smokeless tobacco: Never Used  . Alcohol use No    Review of Systems Constitutional: No fever/chills Eyes: No visual changes. ENT: No sore throat. Cardiovascular: as above Respiratory: as above Gastrointestinal: No abdominal pain.  No nausea, no vomiting.  No diarrhea.  No constipation. Genitourinary: Negative for dysuria. Musculoskeletal: Negative for back pain. Skin: Negative for rash. Neurological: Negative for headaches, focal weakness or numbness.  10-point ROS otherwise negative.  ____________________________________________   PHYSICAL EXAM:  VITAL SIGNS: ED Triage Vitals  Enc Vitals Group     BP  07/18/16 2123 (!) 86/53     Pulse Rate 07/18/16 2123 (!) 104     Resp 07/18/16 2123 (!) 40     Temp 07/18/16 2123 98 F (36.7 C)     Temp Source 07/18/16 2123 Oral     SpO2 07/18/16 2123 92 %     Weight 07/18/16 2124 120 lb (54.4 kg)     Height 07/18/16 2124  (1.575 m)     Head Circumference --      Peak Flow --      Pain Score --      Pain Loc --      Pain Edu? --      Excl. in GC? --     Constitutional: Alert and oriented. Visible increased work of breathing. Eyes: Conjunctivae are normal. PERRL. EOMI. Head: Atraumatic. Nose: No congestion/rhinnorhea. Mouth/Throat: Mucous membranes are moist.   Neck: No stridor.   Cardiovascular:tachycardic,  regular rhythm. Grossly normal heart sounds.   Respiratory: Tachypneic but able to speak in full sentences. Wheezing throughout with a prolonged expiratory phase. Gastrointestinal: Soft and nontender. No distention.  Musculoskeletal: No lower extremity tenderness nor edema.  No joint effusions. Neurologic:  Normal speech and language. No gross focal neurologic deficits are appreciated.  Skin:  Skin is warm, dry and intact. No rash noted. Psychiatric: Mood and affect are normal. Speech and behavior are normal.  ____________________________________________   LABS (all labs ordered are listed, but only abnormal results are displayed)  Labs Reviewed  CBC WITH DIFFERENTIAL/PLATELET - Abnormal; Notable for the following:       Result Value   WBC 15.5 (*)    Neutro Abs 9.6 (*)    Monocytes Absolute 1.5 (*)    Eosinophils Absolute 0.8 (*)    All other components within normal limits  BASIC METABOLIC PANEL - Abnormal; Notable for the following:    Glucose, Bld 104 (*)    All other components within normal limits  TROPONIN I   ____________________________________________  EKG  ED ECG REPORT I, Arelia Longest, the attending physician, personally viewed and interpreted this ECG.   Date: 07/19/2016  EKG Time: 2136  Rate:  94  Rhythm: normal sinus rhythm  Axis: normal  Intervals:none  ST&T Change: No ST segment elevation or depression. No abnormal T-wave inversion.  ____________________________________________  RADIOLOGY  DG Chest 1 View (Accession [id number]) (Order [id number])  Imaging  Date: 07/18/2016 Department: [facility] EMERGENCY DEPARTMENT Released By/Authorizing: Myrna Blazer, MD (auto-released)  Exam Information   Status Exam Begun  Exam Ended   Final [99] 07/18/2016 9:50 PM 07/18/2016 9:51 PM  PACS Images   Show images for DG Chest 1 View  Study Result   CLINICAL DATA:  Acute onset of shortness of breath. Initial encounter.  EXAM: CHEST 1 VIEW  COMPARISON:  Chest radiograph performed 07/04/2016  FINDINGS: The lungs are well-aerated and clear. There is no  evidence of focal opacification, pleural effusion or pneumothorax.  The cardiomediastinal silhouette is within normal limits. No acute osseous abnormalities are seen.  IMPRESSION: No acute cardiopulmonary process seen.   Electronically Signed   By: Roanna Raider M.D.   On: 07/18/2016 21:58     ____________________________________________   PROCEDURES  Procedure(s) performed:   Procedures  Critical Care performed:   ____________________________________________   INITIAL IMPRESSION / ASSESSMENT AND PLAN / ED COURSE  Pertinent labs & imaging results that were available during my care of the patient were reviewed by me and considered in my medical decision making (see chart for details).  ----------------------------------------- 2330 PM on 07/18/2016 -----------------------------------------  Patient receiving nebs, steroids and antibiotics at this time. Signed out to Dr. York Cerise for further evaluation and disposition. Patient to be given fluids. Initially hypotensive in triage but then on my initial evaluation was 120 systolic.  Clinical Course as of Jul 19 8    Sat Jul 18, 2016  2332 Assuming care from Dr. Pershing Proud.  In short, Melissa Dean is a 46 y.o. female with a chief complaint of probable COPD exacerbation.  Refer to the original H&P for additional details.  The current plan of care is to reassess after treatments.   [CF]    Clinical Course User Index [CF] Loleta Rose, MD     ____________________________________________   FINAL CLINICAL IMPRESSION(S) / ED DIAGNOSES  COPD exacerbation.     NEW MEDICATIONS STARTED DURING THIS VISIT:  New Prescriptions   No medications on file     Note:  This document was prepared using Dragon voice recognition software and may include unintentional dictation errors.    Myrna Blazer, MD 07/19/16 [phone]

## 2016-07-19 NOTE — Discharge Instructions (Signed)
We believe that your symptoms are caused today by an exacerbation of your COPD, and possibly bronchitis.  Please take the prescribed medications and any medications that you have at home for your COPD.  Follow up with your doctor as recommended.  If you develop any new or worsening symptoms, including but not limited to fever, persistent vomiting, worsening shortness of breath, or other symptoms that concern you, please return to the Emergency Department immediately. ° °

## 2016-07-19 NOTE — ED Notes (Signed)
ED Provider at bedside. 

## 2016-07-28 ENCOUNTER — Encounter: Payer: Self-pay | Admitting: Medical Oncology

## 2016-07-28 ENCOUNTER — Emergency Department: Payer: Self-pay

## 2016-07-28 ENCOUNTER — Emergency Department
Admission: EM | Admit: 2016-07-28 | Discharge: 2016-07-28 | Disposition: A | Discharge status: Home / Self Care | Payer: Self-pay | Attending: Emergency Medicine | Admitting: Emergency Medicine

## 2016-07-28 DIAGNOSIS — Z87891 Personal history of nicotine dependence: Secondary | ICD-10-CM | POA: Insufficient documentation

## 2016-07-28 DIAGNOSIS — J441 Chronic obstructive pulmonary disease with (acute) exacerbation: Secondary | ICD-10-CM | POA: Insufficient documentation

## 2016-07-28 LAB — TROPONIN I: Troponin I: <0.03 ng/mL (ref <0.03)

## 2016-07-28 LAB — CBC WITH DIFFERENTIAL/PLATELET
Basophils Absolute: 0.1 10*3/uL (ref 0–0.1)
Basophils Relative: 1 %
Eosinophils Absolute: 0.7 10*3/uL (ref 0–0.7)
Eosinophils Relative: 8 %
HEMATOCRIT: 44.5 % (ref 35.0–47.0)
HEMOGLOBIN: 15.4 g/dL (ref 12.0–16.0)
LYMPHS ABS: 1.9 10*3/uL (ref 1.0–3.6)
LYMPHS PCT: 20 %
MCH: 32.3 pg (ref 26.0–34.0)
MCHC: 34.6 g/dL (ref 32.0–36.0)
MCV: 93.4 fL (ref 80.0–100.0)
Monocytes Absolute: 0.7 10*3/uL (ref 0.2–0.9)
Monocytes Relative: 8 %
NEUTROS PCT: 63 %
Neutro Abs: 5.9 10*3/uL (ref 1.4–6.5)
Platelets: 205 10*3/uL (ref 150–440)
RBC: 4.76 MIL/uL (ref 3.80–5.20)
RDW: 14.3 % (ref 11.5–14.5)
WBC: 9.2 10*3/uL (ref 3.6–11.0)

## 2016-07-28 LAB — BASIC METABOLIC PANEL
Anion gap: 8 (ref 5–15)
BUN: 7 mg/dL (ref 6–20)
CHLORIDE: 109 mmol/L (ref 101–111)
CO2: 25 mmol/L (ref 22–32)
Calcium: 9.2 mg/dL (ref 8.9–10.3)
Creatinine, Ser: 0.69 mg/dL (ref 0.44–1.00)
GFR calc Af Amer: >60 mL/min (ref >60)
GFR calc non Af Amer: >60 mL/min (ref >60)
GLUCOSE: 91 mg/dL (ref 65–99)
POTASSIUM: 3.2 mmol/L — AB (ref 3.5–5.1)
Sodium: 142 mmol/L (ref 135–145)

## 2016-07-28 MED ORDER — IPRATROPIUM-ALBUTEROL 0.5-2.5 (3) MG/3ML IN SOLN
3.0000 mL | Freq: Once | RESPIRATORY_TRACT | Status: AC
  Administered 2016-07-28: 3 mL via RESPIRATORY_TRACT
  Filled 2016-07-28: qty 3

## 2016-07-28 MED ORDER — METHYLPREDNISOLONE SODIUM SUCC 125 MG IJ SOLR
125.0000 mg | Freq: Once | INTRAMUSCULAR | Status: AC
  Administered 2016-07-28: 125 mg via INTRAVENOUS
  Filled 2016-07-28: qty 2

## 2016-07-28 MED ORDER — PREDNISONE 10 MG PO TABS: ORAL_TABLET | ORAL | 0 refills | Status: DC

## 2016-07-28 NOTE — ED Notes (Signed)
Pt visualized in NAD at this time. Resting in bed. Denies any needs. States she is feeling some better at this time. This RN will continue to monitor for further patient needs at this time.

## 2016-07-28 NOTE — ED Notes (Signed)
Pt taken for chest xray

## 2016-07-28 NOTE — Discharge Instructions (Addendum)
You were evaluated for wheezing and are being treated for COPD exacerbation.    Return to the emergency department immediately for any worsening trouble breathing, shortness breath, chest pain, dizziness or passing out, or any other symptoms concerning to you.

## 2016-07-28 NOTE — ED Provider Notes (Signed)
[facility] Emergency Department Provider Note ____________________________________________   I have reviewed the triage vital signs and the triage nursing note.  HISTORY  Chief Complaint Wheezing   Historian Patient  HPI Melissa Dean is a 46 y.o. female presents for wheezing and trouble breathing.  Started last night, severe overnight.  Albuterol inhaler not helping.  Recently over past 3 weeks treated twice for COPD/wheezing, although it's not clear that she has a formal diagnosis of COPD.  She did prednisone taper followed by prednisone burst at second visit, completed a few days ago.  She was at the hospital for couple days, and then went home yesterday and had onset of exacerbation, she thinks maybe she could be allergic to something at her home.  No chest pain. Positive for chest tightness.  No nausea vomiting or diarrhea or fevers. No productive cough.    Past Medical History:  Diagnosis Date  . Bronchitis     Patient Active Problem List   Diagnosis Date Noted  . Acute respiratory failure with hypoxia (HCC) 07/04/2016  . COPD with acute exacerbation (HCC) 07/04/2016  . Acute bronchitis 07/04/2016  . Anxiety state 07/18/2014  . Tobacco abuse 07/18/2014  . Abnormal TSH 07/18/2014  . Atypical chest pain 07/18/2014  . Chest pain 07/06/2014    Past Surgical History:  Procedure Laterality Date  . CESAREAN SECTION    . CESAREAN SECTION    . CHOLECYSTECTOMY    . TONSILLECTOMY    . TUBAL LIGATION      Prior to Admission medications   Medication Sig Start Date End Date Taking? Authorizing Provider  albuterol (PROVENTIL HFA;VENTOLIN HFA) 108 (90 Base) MCG/ACT inhaler Inhale 2-4 puffs by mouth every 4 hours as needed for wheezing, cough, and/or shortness of breath 07/19/16   Loleta Rose, MD  azithromycin (ZITHROMAX) 250 MG tablet Take 1 tablet PO daily for 4 more days starting the day after your visit to the Emergency Department 07/19/16   Loleta Rose, MD  benzonatate (TESSALON) 100 MG capsule Take 1 capsule (100 mg total) by mouth 3 (three) times daily as needed for cough. 07/05/16   Shaune Pollack, MD  citalopram (CELEXA) 20 MG tablet Take 1 tablet (20 mg total) by mouth daily. Patient not taking: Reported on 07/04/2016 07/18/14   Doris Cheadle, MD  diphenhydrAMINE (BENADRYL) 25 MG tablet Take 50 mg by mouth every 6 (six) hours as needed.    Historical Provider, MD  guaiFENesin (MUCINEX) 600 MG 12 hr tablet Take by mouth 2 (two) times daily.    Historical Provider, MD  guaiFENesin-codeine 100-10 MG/5ML syrup Take 5 mLs by mouth every 6 (six) hours as needed for cough. 05/03/16   Minna Antis, MD  HYDROcodone-homatropine [facility]) 5-1.5 MG/5ML syrup Take 5 mLs by mouth every 6 (six) hours as needed for cough. 07/19/16   Loleta Rose, MD  nicotine (NICODERM CQ) 21 mg/24hr patch Place 1 patch (21 mg total) onto the skin daily. 07/05/16   Shaune Pollack, MD  predniSONE (DELTASONE) 10 MG tablet  once per day for 2 days then  once per day for 2 days then  once per day for 2 days then  once per day for 2 days then  once per day for 2 days 07/28/16   Governor Rooks, MD    No Known Allergies  Family History  Problem Relation Age of Onset  . Heart disease Father   . Hypertension Father   . Diabetes Father   . Cancer Father  lung cancer  . Diabetes Brother   . Hypertension Paternal Grandfather   . Diabetes Paternal Grandfather   . Thyroid disease      Social History Social History  Substance Use Topics  . Smoking status: Former Smoker    Packs/day: 0.50    Years: 20.00  . Smokeless tobacco: Never Used  . Alcohol use No    Review of Systems  Constitutional: Negative for fever. Eyes: Negative for visual changes. ENT: Negative for sore throat. Cardiovascular: Negative for chest pain. Respiratory: Positive for shortness of breath. Gastrointestinal: Negative for abdominal pain, vomiting and  diarrhea. Genitourinary: Negative for dysuria. Musculoskeletal: Negative for back pain. Skin: Negative for rash. Neurological: Negative for headache. 10 point Review of Systems otherwise negative ____________________________________________   PHYSICAL EXAM:  VITAL SIGNS: ED Triage Vitals  Enc Vitals Group     BP --      Pulse --      Resp --      Temp --      Temp src --      SpO2 07/28/16 0740 93 %     Weight 07/28/16 0722 120 lb (54.4 kg)     Height 07/28/16 0722  (1.575 m)     Head Circumference --      Peak Flow --      Pain Score --      Pain Loc --      Pain Edu? --      Excl. in GC? --      Constitutional: Alert and oriented. Moderate respiratory distress with tachypnea and wheezing. HEENT   Head: Normocephalic and atraumatic.      Eyes: Conjunctivae are normal. PERRL. Normal extraocular movements.      Ears:         Nose: No congestion/rhinnorhea.   Mouth/Throat: Mucous membranes are moist.   Neck: No stridor. Cardiovascular/Chest: Tachycardic, regular rhythm.  No murmurs, rubs, or gallops. Respiratory: Mild to moderate respiratory distress with tachypnea and speaking only in about 3 words at a time, suprasternal retractions.  Wheezing severely in all lung fields with decreased air movement throughout. Gastrointestinal: Soft. No distention, no guarding, no rebound. Nontender.    Genitourinary/rectal:Deferred Musculoskeletal: Nontender with normal range of motion in all extremities. No joint effusions.  No lower extremity tenderness.  No edema. Neurologic:  Normal speech and language. No gross or focal neurologic deficits are appreciated. Skin:  Skin is warm, dry and intact. No rash noted. Psychiatric: Mood and affect are normal. Speech and behavior are normal. Patient exhibits appropriate insight and judgment.   ____________________________________________  LABS (pertinent positives/negatives)  Labs Reviewed  BASIC METABOLIC PANEL -  Abnormal; Notable for the following:       Result Value   Potassium 3.2 (*)    All other components within normal limits  CBC WITH DIFFERENTIAL/PLATELET  TROPONIN I    ____________________________________________    EKG I, Governor Rooks, MD, the attending physician have personally viewed and interpreted all ECGs.  103 bpm. Sinus tachycardia. Narrow QRS. Normal axis. Nonspecific ST and T-wave. Occasional PVC. ____________________________________________  RADIOLOGY All Xrays were viewed by me. Imaging interpreted by Radiologist.  Chest x-ray two-view: No evidence of active disease. __________________________________________  PROCEDURES  Procedure(s) performed: None  Critical Care performed: None  ____________________________________________   ED COURSE / ASSESSMENT AND PLAN  Pertinent labs & imaging results that were available during my care of the patient were reviewed by me and considered in my medical decision making (see chart  for details).   O2 sat around 93% on room air, patient having significant decreased air movement and severe wheezing with tachypnea and retractions. She was started on stocks DuoNeb treatment as well as Solu-Medrol.  She was treated here with improvement, treated for what I do believe is a COPD exacerbation. However, I did discuss with her that she certainly needs to follow with primary care doctor and a pulmonologist for definitive diagnosis, and ongoing management especially in the setting of multiple back-to-back exacerbations.   CONSULTATIONS:  None  Patient / Family / Caregiver informed of clinical course, medical decision-making process, and agree with plan.   I discussed return precautions, follow-up instructions, and discharge instructions with patient and/or family.  Discharge Instructions:  You were evaluated for wheezing and are being treated for COPD exacerbation.    Return to the emergency department immediately for any  worsening trouble breathing, shortness breath, chest pain, dizziness or passing out, or any other symptoms concerning to you. ___________________________________________   FINAL CLINICAL IMPRESSION(S) / ED DIAGNOSES   Final diagnoses:  COPD with acute exacerbation [facility])              Note: This dictation was prepared with Dragon dictation. Any transcriptional errors that result from this process are unintentional    Governor Rooks, MD 07/28/16 1126

## 2016-07-28 NOTE — ED Notes (Signed)
Pt visualized in NAD, tolerated collection of blood well. Pt denies any needs, explained delay to patient, states understanding. Will continue to monitor for further patient needs at this time.

## 2016-07-28 NOTE — ED Notes (Signed)
Pt visualized in NAD at this time. Pt resting in bed, states she feels better. Delay explained and apologized for by this RN, explained when D/C papers were received patient would be D/C. Pt states understanding, denies any further needs at this time. Will continue to monitor for further patient needs.

## 2016-07-28 NOTE — ED Triage Notes (Signed)
Pt reports wheezing and sob that began this am around 0300. Pt reports her inhaler is out.

## 2016-08-04 ENCOUNTER — Ambulatory Visit: Payer: Self-pay | Admitting: Urology

## 2016-08-04 VITALS — BP 136/87 | HR 73 | Temp 98.2°F | Wt 122.8 lb

## 2016-08-04 DIAGNOSIS — J441 Chronic obstructive pulmonary disease with (acute) exacerbation: Secondary | ICD-10-CM

## 2016-08-04 MED ORDER — FLUTICASONE-SALMETEROL 100-50 MCG/DOSE IN AEPB
1.0000 | INHALATION_SPRAY | Freq: Two times a day (BID) | RESPIRATORY_TRACT | 0 refills | Status: DC
Start: 2016-08-04 — End: 2016-08-20

## 2016-08-04 MED ORDER — AZITHROMYCIN 250 MG PO TABS: ORAL_TABLET | ORAL | 0 refills | Status: DC

## 2016-08-04 MED ORDER — ALBUTEROL SULFATE HFA 108 (90 BASE) MCG/ACT IN AERS: INHALATION_SPRAY | RESPIRATORY_TRACT | 1 refills | Status: DC

## 2016-08-04 MED ORDER — PREDNISONE 10 MG PO TABS: ORAL_TABLET | ORAL | 0 refills | Status: DC

## 2016-08-04 NOTE — Progress Notes (Signed)
Patient: Melissa Dean Female    DOB: 29-Jan-1971   46 y.o.   MRN: [id number] Visit Date: 08/04/2016  Today's Provider: Michiel Cowboy, PA-C   No chief complaint on file.  Subjective:    HPI 46 yo WF with a history of COPD with several visits to the ED and hospital visit for exacerbations and/or episodes of bronchitis.     She states she has been having episodes of "asthma attacks" since 01/2016.  She has not had a previous diagnosed with asthma as a child.  She denies a history of allergies.  She has recently quit smoking x 1 month.  She has a 20 ppd history.    She is using Proventil sometimes every 15 minutes and then has to go in for breathing treatments in the ED.    She was prescribed azithromycin and prednisone in the ED, but she has been unable to afford the medications.    She has tried OTC benadryl without relief.       No Known Allergies Previous Medications   BENZONATATE (TESSALON) 100 MG CAPSULE    Take 1 capsule (100 mg total) by mouth 3 (three) times daily as needed for cough.   CITALOPRAM (CELEXA) 20 MG TABLET    Take 1 tablet (20 mg total) by mouth daily.   DIPHENHYDRAMINE (BENADRYL) 25 MG TABLET    Take 50 mg by mouth every 6 (six) hours as needed.   GUAIFENESIN (MUCINEX) 600 MG 12 HR TABLET    Take by mouth 2 (two) times daily.   GUAIFENESIN-CODEINE 100-10 MG/5ML SYRUP    Take 5 mLs by mouth every 6 (six) hours as needed for cough.   HYDROCODONE-HOMATROPINE (HYCODAN) 5-1.5 MG/5ML SYRUP    Take 5 mLs by mouth every 6 (six) hours as needed for cough.   NICOTINE (NICODERM CQ) 21 MG/24HR PATCH    Place 1 patch (21 mg total) onto the skin daily.    Review of Systems  Constitutional: Positive for fatigue. Negative for fever.  HENT: Positive for dental problem. Negative for congestion, drooling and postnasal drip.   Eyes: Negative for pain, discharge and itching.  Respiratory: Positive for cough, chest tightness, shortness of breath and wheezing. Negative for  choking.   Cardiovascular: Negative for chest pain, palpitations and leg swelling.  Gastrointestinal: Negative for abdominal distention, abdominal pain and anal bleeding.  Endocrine: Negative for cold intolerance, heat intolerance and polydipsia.  Genitourinary: Negative for difficulty urinating, dyspareunia and dysuria.  Musculoskeletal: Negative for arthralgias, back pain and gait problem.  Allergic/Immunologic: Negative for environmental allergies, food allergies and immunocompromised state.  Neurological: Positive for headaches. Negative for dizziness, facial asymmetry, light-headedness and numbness.  Hematological: Negative for adenopathy. Does not bruise/bleed easily.  Psychiatric/Behavioral: Negative for agitation, behavioral problems, confusion and decreased concentration.    Social History  Substance Use Topics  . Smoking status: Former Smoker    Packs/day: 0.50    Years: 20.00  . Smokeless tobacco: Never Used     Comment: started nictoine patch  . Alcohol use No   Objective:   BP 136/87   Pulse 73   Temp 98.2 F (36.8 C)   Wt 122 lb 12.8 oz (55.7 kg)   LMP 07/27/2016 (Approximate)   BMI 22.46 kg/m   Physical Exam Constitutional: Well nourished. Alert and oriented, No acute distress. HEENT: [place] AT, moist mucus membranes. Trachea midline, no masses. Cardiovascular: No clubbing, cyanosis, or edema. Respiratory: Normal respiratory effort, no increased work of breathing.  Expiratory wheezes bilaterally.  GI: Abdomen is soft, non tender, non distended, no abdominal masses. Liver and spleen not palpable.  No hernias appreciated.  Stool sample for occult testing is not indicated.   GU: No CVA tenderness.  No bladder fullness or masses.   Skin: No rashes, bruises or suspicious lesions. Lymph: No cervical or inguinal adenopathy. Neurologic: Grossly intact, no focal deficits, moving all 4 extremities. Psychiatric: Normal mood and affect.      Assessment & Plan:   1.  COPD  - exacerbations leading to hospital admissions and several ED visits  - using SABA q 15 minutes with no relief  - refill the Proventil and add Advair - script sent to [facility]  - sent scripts for prednisone and azithromycin in [facility] as patient finds them cost prohibitive  - need referral to pulmonologist  - has paper work for [employer] charity care  - reviewed red flag signs  - RTC in one month          Michiel Cowboy, PA-C   [facility]

## 2016-08-06 ENCOUNTER — Telehealth: Payer: Self-pay | Admitting: Pharmacy Technician

## 2016-08-06 NOTE — Telephone Encounter (Signed)
Patient approved for medication assistance at [facility] through 2018, as long as eligibility criteria continues to be met.  [facility]

## 2016-08-16 ENCOUNTER — Emergency Department
Admission: EM | Admit: 2016-08-16 | Discharge: 2016-08-16 | Disposition: A | Discharge status: Home / Self Care | Payer: Self-pay | Attending: Emergency Medicine | Admitting: Emergency Medicine

## 2016-08-16 ENCOUNTER — Encounter: Payer: Self-pay | Admitting: Intensive Care

## 2016-08-16 ENCOUNTER — Emergency Department: Payer: Self-pay

## 2016-08-16 DIAGNOSIS — J4541 Moderate persistent asthma with (acute) exacerbation: Secondary | ICD-10-CM | POA: Insufficient documentation

## 2016-08-16 DIAGNOSIS — R0602 Shortness of breath: Secondary | ICD-10-CM

## 2016-08-16 DIAGNOSIS — Z79899 Other long term (current) drug therapy: Secondary | ICD-10-CM | POA: Insufficient documentation

## 2016-08-16 DIAGNOSIS — R0600 Dyspnea, unspecified: Secondary | ICD-10-CM | POA: Insufficient documentation

## 2016-08-16 DIAGNOSIS — J449 Chronic obstructive pulmonary disease, unspecified: Secondary | ICD-10-CM | POA: Insufficient documentation

## 2016-08-16 DIAGNOSIS — Z87891 Personal history of nicotine dependence: Secondary | ICD-10-CM | POA: Insufficient documentation

## 2016-08-16 HISTORY — DX: Unspecified asthma, uncomplicated: J45.909

## 2016-08-16 LAB — CBC WITH DIFFERENTIAL/PLATELET
BASOS ABS: 0.1 10*3/uL (ref 0–0.1)
Basophils Relative: 1 %
EOS ABS: 0.5 10*3/uL (ref 0–0.7)
Eosinophils Relative: 4 %
HCT: 44.3 % (ref 35.0–47.0)
HEMOGLOBIN: 15 g/dL (ref 12.0–16.0)
LYMPHS ABS: 2.4 10*3/uL (ref 1.0–3.6)
LYMPHS PCT: 21 %
MCH: 31.4 pg (ref 26.0–34.0)
MCHC: 33.9 g/dL (ref 32.0–36.0)
MCV: 92.8 fL (ref 80.0–100.0)
Monocytes Absolute: 0.9 10*3/uL (ref 0.2–0.9)
Monocytes Relative: 8 %
NEUTROS PCT: 66 %
Neutro Abs: 7.8 10*3/uL — ABNORMAL HIGH (ref 1.4–6.5)
PLATELETS: 193 10*3/uL (ref 150–440)
RBC: 4.77 MIL/uL (ref 3.80–5.20)
RDW: 13.8 % (ref 11.5–14.5)
WBC: 11.7 10*3/uL — AB (ref 3.6–11.0)

## 2016-08-16 LAB — BASIC METABOLIC PANEL
ANION GAP: 7 (ref 5–15)
BUN: 7 mg/dL (ref 6–20)
CHLORIDE: 107 mmol/L (ref 101–111)
CO2: 25 mmol/L (ref 22–32)
Calcium: 9.3 mg/dL (ref 8.9–10.3)
Creatinine, Ser: 0.8 mg/dL (ref 0.44–1.00)
GFR calc non Af Amer: >60 mL/min (ref >60)
GLUCOSE: 95 mg/dL (ref 65–99)
Potassium: 3.4 mmol/L — ABNORMAL LOW (ref 3.5–5.1)
Sodium: 139 mmol/L (ref 135–145)

## 2016-08-16 LAB — POCT PREGNANCY, URINE: Preg Test, Ur: NEGATIVE

## 2016-08-16 MED ORDER — MAGNESIUM SULFATE IN D5W 1-5 GM/100ML-% IV SOLN
1.0000 g | INTRAVENOUS | Status: AC
  Administered 2016-08-16: 1 g via INTRAVENOUS
  Filled 2016-08-16: qty 100

## 2016-08-16 MED ORDER — SODIUM CHLORIDE 0.9 % IV BOLUS (SEPSIS)
1000.0000 mL | Freq: Once | INTRAVENOUS | Status: AC
  Administered 2016-08-16: 1000 mL via INTRAVENOUS

## 2016-08-16 MED ORDER — IPRATROPIUM-ALBUTEROL 0.5-2.5 (3) MG/3ML IN SOLN
RESPIRATORY_TRACT | Status: AC
  Administered 2016-08-16: 3 mL
  Filled 2016-08-16: qty 6

## 2016-08-16 MED ORDER — METHYLPREDNISOLONE SODIUM SUCC 125 MG IJ SOLR
125.0000 mg | Freq: Once | INTRAMUSCULAR | Status: AC
  Administered 2016-08-16: 125 mg via INTRAVENOUS
  Filled 2016-08-16: qty 2

## 2016-08-16 MED ORDER — PREDNISONE 10 MG PO TABS: ORAL_TABLET | ORAL | 0 refills | Status: DC

## 2016-08-16 MED ORDER — ALBUTEROL SULFATE (2.5 MG/3ML) 0.083% IN NEBU
5.0000 mg | INHALATION_SOLUTION | Freq: Once | RESPIRATORY_TRACT | Status: AC
  Administered 2016-08-16: 5 mg via RESPIRATORY_TRACT
  Filled 2016-08-16: qty 6

## 2016-08-16 MED ORDER — IOPAMIDOL (ISOVUE-370) INJECTION 76%
75.0000 mL | Freq: Once | INTRAVENOUS | Status: AC | PRN
  Administered 2016-08-16: 60 mL via INTRAVENOUS

## 2016-08-16 MED ORDER — CETIRIZINE HCL 10 MG PO TABS: 10.0000 mg | ORAL_TABLET | Freq: Every day | ORAL | 2 refills | Status: DC

## 2016-08-16 NOTE — ED Notes (Signed)
EDP is at the bedside.  Attempted IV start in RPFA without success.

## 2016-08-16 NOTE — ED Notes (Signed)
Patient transported to X-ray 

## 2016-08-16 NOTE — ED Notes (Signed)
Pt is back from radiology, blanket in place

## 2016-08-16 NOTE — ED Provider Notes (Signed)
[facility] Emergency Department Provider Note  ____________________________________________  Time seen: Approximately 6:04 PM  I have reviewed the triage vital signs and the nursing notes.   HISTORY  Chief Complaint Shortness of Breath    HPI Melissa Dean is a 46 y.o. female who complains of shortness of breath with productive cough, worsening since this morning. She reports that she's been battling shortness of breath and wheezing symptoms for the past 6 months, multiple courses of steroids and antibiotics. Using albuterol home without relief. Denies fever or chills. No chest pain. Today shortness of breath is constant since onset this morning. No aggravating or alleviating factors. Severe per the patient.    Past Medical History:  Diagnosis Date  . Asthma   . Bronchitis      Patient Active Problem List   Diagnosis Date Noted  . Acute respiratory failure with hypoxia (HCC) 07/04/2016  . COPD with acute exacerbation (HCC) 07/04/2016  . Acute bronchitis 07/04/2016  . Anxiety state 07/18/2014  . Tobacco abuse 07/18/2014  . Abnormal TSH 07/18/2014  . Atypical chest pain 07/18/2014  . Chest pain 07/06/2014     Past Surgical History:  Procedure Laterality Date  . CESAREAN SECTION    . CESAREAN SECTION    . CHOLECYSTECTOMY    . TONSILLECTOMY    . TUBAL LIGATION       Prior to Admission medications   Medication Sig Start Date End Date Taking? Authorizing Provider  albuterol (PROVENTIL HFA;VENTOLIN HFA) 108 (90 Base) MCG/ACT inhaler Inhale 2-4 puffs by mouth every 4 hours as needed for wheezing, cough, and/or shortness of breath 08/04/16   McGowan, Carollee Herter A, PA-C  azithromycin (ZITHROMAX) 250 MG tablet Take 1 tablet PO daily for 4 more days starting the day after your visit to the Emergency Department 08/04/16   Michiel Cowboy A, PA-C  benzonatate (TESSALON) 100 MG capsule Take 1 capsule (100 mg total) by mouth 3 (three) times daily as needed  for cough. 07/05/16   Shaune Pollack, MD  cetirizine (ZYRTEC) 10 MG tablet Take 1 tablet (10 mg total) by mouth daily. 08/16/16   Sharman Cheek, MD  citalopram (CELEXA) 20 MG tablet Take 1 tablet (20 mg total) by mouth daily. Patient not taking: Reported on 07/04/2016 07/18/14   Doris Cheadle, MD  diphenhydrAMINE (BENADRYL) 25 MG tablet Take 50 mg by mouth every 6 (six) hours as needed.    [provider]  Fluticasone-Salmeterol (ADVAIR DISKUS) 100-50 MCG/DOSE AEPB Inhale 1 puff into the lungs 2 (two) times daily. 08/04/16   McGowan, Carollee Herter A, PA-C  guaiFENesin (MUCINEX) 600 MG 12 hr tablet Take by mouth 2 (two) times daily.    [provider]  guaiFENesin-codeine 100-10 MG/5ML syrup Take 5 mLs by mouth every 6 (six) hours as needed for cough. Patient not taking: Reported on 08/04/2016 05/03/16   Minna Antis, MD  HYDROcodone-homatropine [facility]) 5-1.5 MG/5ML syrup Take 5 mLs by mouth every 6 (six) hours as needed for cough. Patient not taking: Reported on 08/04/2016 07/19/16   Loleta Rose, MD  nicotine (NICODERM CQ) 21 mg/24hr patch Place 1 patch (21 mg total) onto the skin daily. Patient not taking: Reported on 08/04/2016 07/05/16   Shaune Pollack, MD  predniSONE (DELTASONE) 10 MG tablet Take five tablets a day (50 mg) for 3 days,  Then four tablets a day (40 mg) for 3 days  Then three tablets a day (30 mg) for 3 days  Then two tablets a day (20 mg) for 5  days  Then one tablet a day (10 mg) for 5 days 08/16/16   Sharman Cheek, MD     Allergies Patient has no known allergies.   Family History  Problem Relation Age of Onset  . Heart disease Father   . Hypertension Father   . Diabetes Father   . Cancer Father        lung cancer  . Diabetes Brother   . Hypertension Paternal Grandfather   . Diabetes Paternal Grandfather   . Thyroid disease Unknown     Social History Social History  Substance Use Topics  . Smoking status: Former Smoker    Packs/day: 0.50    Years:  20.00  . Smokeless tobacco: Never Used     Comment: started nictoine patch  . Alcohol use No    Review of Systems  Constitutional:   No fever or chills.  ENT:   No sore throat. No rhinorrhea. Lymphatic: No swollen glands, No extremity swelling Endocrine: No hot/cold flashes. No significant weight change. No neck swelling. Cardiovascular:   No chest pain or syncope. Respiratory:   Positive shortness of breath and dry cough. Gastrointestinal:   Negative for abdominal pain, vomiting and diarrhea.  Genitourinary:   Negative for dysuria or difficulty urinating. Musculoskeletal:   Negative for focal pain or swelling Neurological:   Negative for headaches or weakness. All other systems reviewed and are negative except as documented above in ROS and HPI.  ____________________________________________   PHYSICAL EXAM:  VITAL SIGNS: ED Triage Vitals  Enc Vitals Group     BP 08/16/16 1527 (!) 159/125     Pulse Rate 08/16/16 1527 92     Resp 08/16/16 1527 16     Temp 08/16/16 1527 98.7 F (37.1 C)     Temp Source 08/16/16 1527 Oral     SpO2 08/16/16 1527 (!) 89 %     Weight 08/16/16 1528 125 lb (56.7 kg)     Height 08/16/16 1528 5\' 2"  (1.575 m)     Head Circumference --      Peak Flow --      Pain Score 08/16/16 1531 7     Pain Loc --      Pain Edu? --      Excl. in GC? --   92% on 2 L nasal cannula  Vital signs reviewed, nursing assessments reviewed.   Constitutional:   Alert and oriented. Well appearing and in no distress. Eyes:   No scleral icterus. No conjunctival pallor. PERRL. EOMI.  No nystagmus. ENT   Head:   Normocephalic and atraumatic.   Nose:   No congestion/rhinnorhea. No septal hematoma   Mouth/Throat:   MMM, no pharyngeal erythema. No peritonsillar mass.    Neck:   No stridor. No SubQ emphysema. No meningismus. Hematological/Lymphatic/Immunilogical:   No cervical lymphadenopathy. Cardiovascular:   RRR. Symmetric bilateral radial and DP pulses.  No  murmurs.  Respiratory:   Tachypnea. Diffuse expiratory wheezing. No focal crackles.. Gastrointestinal:   Soft and nontender. Non distended. There is no CVA tenderness.  No rebound, rigidity, or guarding. Genitourinary:   deferred Musculoskeletal:   Normal range of motion in all extremities. No joint effusions.  No lower extremity tenderness.  No edema. Neurologic:   Normal speech and language.  CN 2-10 normal. Motor grossly intact. No gross focal neurologic deficits are appreciated.  Skin:    Skin is warm, dry and intact. No rash noted.  No petechiae, purpura, or bullae.  ____________________________________________    LABS (  pertinent positives/negatives) (all labs ordered are listed, but only abnormal results are displayed) Labs Reviewed  BASIC METABOLIC PANEL - Abnormal; Notable for the following:       Result Value   Potassium 3.4 (*)    All other components within normal limits  CBC WITH DIFFERENTIAL/PLATELET - Abnormal; Notable for the following:    WBC 11.7 (*)    Neutro Abs 7.8 (*)    All other components within normal limits  POC URINE PREG, ED  POCT PREGNANCY, URINE   ____________________________________________   EKG  Interpreted by me Sinus tachycardia rate 102, normal axis and intervals. Normal QRS ST segments and T waves. 3 PVCs on the strip.  ____________________________________________    RADIOLOGY  Dg Chest 2 View  Result Date: 08/16/2016 CLINICAL DATA:  Initial evaluation for acute shortness of breath. EXAM: CHEST  2 VIEW COMPARISON:  Prior radiograph from 07/28/2016. FINDINGS: The cardiac and mediastinal silhouettes are stable in size and contour, and remain within normal limits. The lungs are normally inflated. No airspace consolidation, pleural effusion, or pulmonary edema is identified. There is no pneumothorax. No acute osseous abnormality identified. IMPRESSION: No radiographic evidence for active cardiopulmonary disease. Electronically Signed   By:  Rise MuBenjamin  McClintock M.D.   On: 08/16/2016 16:22   Ct Angio Chest Pe W Or Wo Contrast  Result Date: 08/16/2016 CLINICAL DATA:  46 year old female with acute shortness of breath today. History of asthma. EXAM: CT ANGIOGRAPHY CHEST WITH CONTRAST TECHNIQUE: Multidetector CT imaging of the chest was performed using the standard protocol during bolus administration of intravenous contrast. Multiplanar CT image reconstructions and MIPs were obtained to evaluate the vascular anatomy. CONTRAST:  60 cc intravenous Isovue 370 COMPARISON:  08/16/2016 and prior chest radiographs. 08/14/2009 chest CT FINDINGS: Cardiovascular: Satisfactory opacification of the pulmonary arteries to the segmental level. No evidence of pulmonary embolism. Normal heart size. No thoracic aortic aneurysm or pericardial effusion. Mediastinum/Nodes: No enlarged mediastinal, hilar, or axillary lymph nodes. Thyroid gland, trachea, and esophagus demonstrate no significant findings. Lungs/Pleura: Subsegmental right middle lobe atelectasis is unchanged from 04/28/2016 radiographs. There is no evidence of airspace disease, consolidation, mass, suspicious nodule, pleural effusion or pneumothorax. Mild peribronchial thickening again noted. Upper Abdomen: No acute abnormality. Musculoskeletal: A 1 cm left paraspinal mass at L3-4 is unchanged from 2011-benign. No acute or suspicious abnormalities are noted. Review of the MIP images confirms the above findings. IMPRESSION: No evidence of acute abnormality.  No evidence of pulmonary emboli. Subsegmental right middle lobe atelectasis, unchanged from prior radiographs. Mild chronic peribronchial thickening. Electronically Signed   By: Harmon PierJeffrey  Hu M.D.   On: 08/16/2016 18:01    ____________________________________________   PROCEDURES Procedures  ____________________________________________   INITIAL IMPRESSION / ASSESSMENT AND PLAN / ED COURSE  Pertinent labs & imaging results that were available  during my care of the patient were reviewed by me and considered in my medical decision making (see chart for details).   Clinical Course as of Aug 16 1949  Wynelle LinkSun Aug 16, 2016  1651 P/w wheezing, nl E:I. With recurrent sx x several months, concern for atypical presentation of PE. Pt is hypoxic, tachypneic. Will get CTA chest.   [PS]    Clinical Course User Index [PS] Sharman CheekStafford, Randal Yepiz, MD     ----------------------------------------- 7:49 PM on 08/16/2016 -----------------------------------------  CT angiogram negative. Patient feels better after nebs magnesium and steroids. Repeat lung auscultation shows improvement in her breathing. Tachypnea is improved. Room air oxygen saturation is 95%. There is still some mild  inducible wheezing with FEV1 maneuver, but overall significantly improved and suitable for outpatient follow-up. I'll put her in a long prednisone taper and start Zyrtec until she can follow up with her doctor for further management of her persistent asthma.  ____________________________________________   FINAL CLINICAL IMPRESSION(S) / ED DIAGNOSES  Final diagnoses:  Dyspnea  Moderate persistent asthma with exacerbation  Shortness of breath      New Prescriptions   CETIRIZINE (ZYRTEC) 10 MG TABLET    Take 1 tablet (10 mg total) by mouth daily.   PREDNISONE (DELTASONE) 10 MG TABLET    Take five tablets a day (50 mg) for 3 days,  Then four tablets a day (40 mg) for 3 days  Then three tablets a day (30 mg) for 3 days  Then two tablets a day (20 mg) for 5 days  Then one tablet a day (10 mg) for 5 days     Portions of this note were generated with dragon dictation software. Dictation errors may occur despite best attempts at proofreading.    Sharman Cheek, MD 08/16/16 [phone]

## 2016-08-16 NOTE — ED Triage Notes (Addendum)
Patient presents with expiratory wheezing and SOB that started this AM. Pt used inhalers at home with no relief. Patient given two duo-nebs in triage

## 2016-08-20 ENCOUNTER — Other Ambulatory Visit: Payer: Self-pay | Admitting: Adult Health Nurse Practitioner

## 2016-08-20 MED ORDER — FLUTICASONE-SALMETEROL 250-50 MCG/DOSE IN AEPB: 1.0000 | INHALATION_SPRAY | Freq: Two times a day (BID) | RESPIRATORY_TRACT | 3 refills | Status: DC

## 2016-08-27 ENCOUNTER — Telehealth: Payer: Self-pay

## 2016-08-27 NOTE — Telephone Encounter (Signed)
Received PAP application from [facility] for Advair placed for provider to sign. 

## 2016-08-28 NOTE — Telephone Encounter (Signed)
Placed signed application/script in [facility] folder for pickup. 

## 2016-09-03 ENCOUNTER — Telehealth: Payer: Self-pay

## 2016-09-03 NOTE — Telephone Encounter (Signed)
Received PAP application from [facility] for Ventolin placed for provider to sign. 

## 2016-09-03 NOTE — Telephone Encounter (Signed)
Placed signed application/script in [facility] folder for pickup. 

## 2016-09-08 ENCOUNTER — Ambulatory Visit: Payer: Self-pay | Admitting: Urology

## 2016-09-08 VITALS — BP 111/69 | HR 67 | Temp 98.6°F | Resp 12 | Ht 62.0 in | Wt 126.6 lb

## 2016-09-08 DIAGNOSIS — J4520 Mild intermittent asthma, uncomplicated: Secondary | ICD-10-CM

## 2016-09-08 DIAGNOSIS — G44001 Cluster headache syndrome, unspecified, intractable: Secondary | ICD-10-CM

## 2016-09-08 NOTE — Progress Notes (Signed)
Patient: Melissa Dean Female    DOB: 1970/07/27   46 y.o.   MRN: [id number] Visit Date: 09/08/2016  Today's Provider: Michiel Cowboy, PA-C   Chief Complaint  Patient presents with  . Follow-up    Asthma follow-up  . Headache   Subjective:  46 yo WF who presents today for COPD follow up.  She has not had any asthma attacks since Mother's Day.  She is using her Advair and Zyrtec.  Has an upcoming appointment with pulmonology in a few weeks.    HA - left temple pain x 2 weeks - achy/dull sensation - pain behind the left eye - no jaw pain - no fevers  Headache   Pertinent negatives include no abdominal pain, back pain, dizziness, eye pain, fever or numbness.  Background history 46 yo WF with a history of COPD with several visits to the ED and hospital visit for exacerbations and/or episodes of bronchitis.     She states she has been having episodes of "asthma attacks" since 01/2016.  She has not had a previous diagnosed with asthma as a child.  She denies a history of allergies.  She has recently quit smoking x 1 month.  She has a 20 ppd history.    She is using Proventil sometimes every 15 minutes and then has to go in for breathing treatments in the ED.    She was prescribed azithromycin and prednisone in the ED, but she has been unable to afford the medications.    She has tried OTC benadryl without relief.       No Known Allergies Previous Medications   ALBUTEROL (PROVENTIL HFA;VENTOLIN HFA) 108 (90 BASE) MCG/ACT INHALER    Inhale 2-4 puffs by mouth every 4 hours as needed for wheezing, cough, and/or shortness of breath   AZITHROMYCIN (ZITHROMAX) 250 MG TABLET    Take 1 tablet PO daily for 4 more days starting the day after your visit to the Emergency Department   BENZONATATE (TESSALON) 100 MG CAPSULE    Take 1 capsule (100 mg total) by mouth 3 (three) times daily as needed for cough.   CETIRIZINE (ZYRTEC) 10 MG TABLET    Take 1 tablet (10 mg total) by mouth daily.   CITALOPRAM (CELEXA) 20 MG TABLET    Take 1 tablet (20 mg total) by mouth daily.   DIPHENHYDRAMINE (BENADRYL) 25 MG TABLET    Take 50 mg by mouth every 6 (six) hours as needed.   FLUTICASONE-SALMETEROL (ADVAIR) 250-50 MCG/DOSE AEPB    Inhale 1 puff into the lungs 2 (two) times daily.   GUAIFENESIN (MUCINEX) 600 MG 12 HR TABLET    Take by mouth 2 (two) times daily.   GUAIFENESIN-CODEINE 100-10 MG/5ML SYRUP    Take 5 mLs by mouth every 6 (six) hours as needed for cough.   HYDROCODONE-HOMATROPINE (HYCODAN) 5-1.5 MG/5ML SYRUP    Take 5 mLs by mouth every 6 (six) hours as needed for cough.   NICOTINE (NICODERM CQ) 21 MG/24HR PATCH    Place 1 patch (21 mg total) onto the skin daily.   PREDNISONE (DELTASONE) 10 MG TABLET    Take five tablets a day (50 mg) for 3 days,  Then four tablets a day (40 mg) for 3 days  Then three tablets a day (30 mg) for 3 days  Then two tablets a day (20 mg) for 5 days  Then one tablet a day (10 mg) for 5 days    Review of Systems  Constitutional: Negative  for fever.  HENT: Positive for dental problem. Negative for congestion, drooling and postnasal drip.   Eyes: Negative for pain, discharge and itching.  Respiratory: Negative for choking.   Cardiovascular: Negative for chest pain, palpitations and leg swelling.  Gastrointestinal: Negative for abdominal distention, abdominal pain and anal bleeding.  Endocrine: Negative for cold intolerance, heat intolerance and polydipsia.  Genitourinary: Negative for difficulty urinating, dyspareunia and dysuria.  Musculoskeletal: Negative for arthralgias, back pain and gait problem.  Allergic/Immunologic: Negative for environmental allergies, food allergies and immunocompromised state.  Neurological: Positive for headaches. Negative for dizziness, facial asymmetry, light-headedness and numbness.  Hematological: Negative for adenopathy. Does not bruise/bleed easily.  Psychiatric/Behavioral: Negative for agitation, behavioral problems,  confusion and decreased concentration.    Social History  Substance Use Topics  . Smoking status: Former Smoker    Packs/day: 0.50    Years: 20.00  . Smokeless tobacco: Never Used     Comment: says she still smokes 1-2 every once in awhile  . Alcohol use No   Objective:   BP 111/69   Pulse 67   Temp 98.6 F (37 C)   Resp 12   Ht 5\' 2"  (1.575 m)   Wt 126 lb 9.6 oz (57.4 kg)   LMP 08/31/2016 (Approximate)   BMI 23.16 kg/m   Physical Exam Constitutional: Well nourished. Alert and oriented, No acute distress. HEENT: [place] AT, moist mucus membranes. Trachea midline, no masses. Cardiovascular: No clubbing, cyanosis, or edema. Respiratory: Normal respiratory effort, no increased work of breathing.  Expiratory wheezes bilaterally.   GI: Abdomen is soft, non tender, non distended, no abdominal masses. Liver and spleen not palpable.  No hernias appreciated.  Stool sample for occult testing is not indicated.   GU: No CVA tenderness.  No bladder fullness or masses.   Skin: No rashes, bruises or suspicious lesions. Lymph: No cervical or inguinal adenopathy. Neurologic: Grossly intact, no focal deficits, moving all 4 extremities. Psychiatric: Normal mood and affect.      Assessment & Plan:   1. COPD  - exacerbations leading to hospital admissions and several ED visits  - using SABA once since last visit  - continue Advair  - [facility] as [facility] finds [facility]  - need referral to pulmonologist - upcoming appointment  - has [facility] work for [facility]  - reviewed red flag signs  - RTC in one month   2. Cluster HA  - given tylenol samples  3. 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

## 2016-09-15 ENCOUNTER — Encounter: Payer: Self-pay | Admitting: Internal Medicine

## 2016-09-15 ENCOUNTER — Ambulatory Visit (INDEPENDENT_AMBULATORY_CARE_PROVIDER_SITE_OTHER): Payer: Self-pay | Admitting: Internal Medicine

## 2016-09-15 VITALS — BP 112/70 | HR 73 | Resp 16 | Ht 62.0 in | Wt 127.0 lb

## 2016-09-15 DIAGNOSIS — J45909 Unspecified asthma, uncomplicated: Secondary | ICD-10-CM

## 2016-09-15 DIAGNOSIS — J449 Chronic obstructive pulmonary disease, unspecified: Secondary | ICD-10-CM

## 2016-09-15 NOTE — Patient Instructions (Signed)
STOP SMOKING!! Continue Advair as prescribed Ventolin as needed Will need repeat CT of the chest in 3 months with follow-up Check alpha-1 anti-trypsin levels  Please call you insurance company and obtain copy of your medication formulary   This will help your Pulmonologist to prescribe the most cost effective medications that your insurance company allows.

## 2016-09-15 NOTE — Progress Notes (Signed)
Name: Melissa Dean MRN: [id number] DOB: [birth year]     CONSULTATION DATE: 09/15/2016  REFERRING MD :  Michiel CowboyShannon McGowan  CHIEF COMPLAINT:  SOB  HISTORY OF PRESENT ILLNESS:  46 yo female seen today for shortness of breath associated with wheezing coughing and chest congestion with intermittent productive cough -Patient has been in and out of the hospital since October of last year with recurrent bouts of COPD/asthma exacerbations -Patient has a history of extensive tobacco abuse she states she quit May 31 but has had several cigarettes since then she has smoked one pack a day for 20 years - she works at [employer] she is not around secondhand smoke - since her discharge from the hospital on Mother's Day she has been placed on Advair twice a day as well as Zyrtec and uses Ventolin as needed  - she states she feels much better with this regimen she also history of history of allergic rhinitis and states she has seasonal allergies  -she has a family history of lung cancer-father  Died  Patient states she has been previously diagnosed with COPD and asthma Patient obtained CT of the chest in May 2018 which showed a right middle lobe opacification likely related to mucous plugging and atelectasis CT scans reviewed with patient  Patient does not have acute respiratory symptoms at this time No signs of infection at this time No signs of CHF at this time   Office spiro 09/15/2016 FEV1/FVC ratio is 68% with FEV1 of 2.1 L 78% predicted FEF 25/75 is 1.2 L with 43% predicted findings concerning for moderate to severe obstructive airway disease, flow volume loops suggest scooping of the expiratory limb which is suggestive of obstruction  PAST MEDICAL HISTORY :   has a past medical history of Anemia; Anxiety; Asthma; Bronchitis; COPD (chronic obstructive pulmonary disease) (HCC); and Depression.  has a past surgical history that includes Cholecystectomy; Cesarean section;  Tonsillectomy; Tubal ligation; and Cesarean section. Prior to Admission medications   Medication Sig Start Date End Date Taking? Authorizing Provider  albuterol (PROVENTIL HFA;VENTOLIN HFA) 108 (90 Base) MCG/ACT inhaler Inhale 2-4 puffs by mouth every 4 hours as needed for wheezing, cough, and/or shortness of breath 08/04/16   McGowan, Carollee HerterShannon A, PA-C  benzonatate (TESSALON) 100 MG capsule Take 1 capsule (100 mg total) by mouth 3 (three) times daily as needed for cough. 07/05/16   Shaune Pollackhen, Qing, MD  cetirizine (ZYRTEC) 10 MG tablet Take 1 tablet (10 mg total) by mouth daily. 08/16/16   Sharman CheekStafford, Phillip, MD  citalopram (CELEXA) 20 MG tablet Take 1 tablet (20 mg total) by mouth daily. Patient not taking: Reported on 07/04/2016 07/18/14   Doris CheadleAdvani, Deepak, MD  diphenhydrAMINE (BENADRYL) 25 MG tablet Take 50 mg by mouth every 6 (six) hours as needed.    [provider]  Fluticasone-Salmeterol (ADVAIR) 250-50 MCG/DOSE AEPB Inhale 1 puff into the lungs 2 (two) times daily. 08/20/16   Doles-Johnson, Teah, NP  guaiFENesin (MUCINEX) 600 MG 12 hr tablet Take by mouth 2 (two) times daily.    [provider]  guaiFENesin-codeine 100-10 MG/5ML syrup Take 5 mLs by mouth every 6 (six) hours as needed for cough. Patient not taking: Reported on 08/04/2016 05/03/16   Minna AntisPaduchowski, Kevin, MD  HYDROcodone-homatropine [facility](HYCODAN) 5-1.5 MG/5ML syrup Take 5 mLs by mouth every 6 (six) hours as needed for cough. Patient not taking: Reported on 08/04/2016 07/19/16   Loleta RoseForbach, Cory, MD  nicotine (NICODERM CQ) 21 mg/24hr patch Place 1 patch (  21 mg total) onto the skin daily. Patient not taking: Reported on 08/04/2016 07/05/16   Shaune Pollack, MD  predniSONE (DELTASONE) 10 MG tablet Take five tablets a day (50 mg) for 3 days,  Then four tablets a day (40 mg) for 3 days  Then three tablets a day (30 mg) for 3 days  Then two tablets a day (20 mg) for 5 days  Then one tablet a day (10 mg) for 5 days Patient not taking: Reported on  09/08/2016 08/16/16   Sharman Cheek, MD   No Known Allergies  FAMILY HISTORY:  family history includes Cancer in her father; Diabetes in her brother, father, and paternal grandfather; Heart disease in her father; Hypertension in her father and paternal grandfather. SOCIAL HISTORY:  reports that she has been smoking.  She has a 10.00 pack-year smoking history. She has never used smokeless tobacco. She reports that she does not drink alcohol or use drugs.  REVIEW OF SYSTEMS:   Constitutional: Negative for fever, chills, weight loss, malaise/fatigue and diaphoresis.  HENT: Negative for hearing loss, ear pain, nosebleeds, congestion, sore throat, neck pain, tinnitus and ear discharge.   Eyes: Negative for blurred vision, double vision, photophobia, pain, discharge and redness.  Respiratory: +cough, -hemoptysis, +sputum production, +shortness of breath, +wheezing and -stridor.   Cardiovascular: Negative for chest pain, palpitations, orthopnea, claudication, leg swelling and PND.  Gastrointestinal: Negative for heartburn, nausea, vomiting, abdominal pain, diarrhea, constipation, blood in stool and melena.  Genitourinary: Negative for dysuria, urgency, frequency, hematuria and flank pain.  Musculoskeletal: Negative for myalgias, back pain, joint pain and falls.  Skin: Negative for itching and rash.  Neurological: Negative for dizziness, tingling, tremors, sensory change, speech change, focal weakness, seizures, loss of consciousness, weakness and headaches.  Endo/Heme/Allergies: Negative for environmental allergies and polydipsia. Does not bruise/bleed easily.  ALL OTHER ROS ARE NEGATIVE  BP 112/70 (BP Location: Right Arm, Cuff Size: Normal)   Pulse 73   Resp 16   Ht 5\' 2"  (1.575 m)   Wt 127 lb (57.6 kg)   LMP 08/31/2016 (Approximate)   SpO2 99%   BMI 23.23 kg/m    Physical Examination:   GENERAL:NAD, no fevers, chills, no weakness no fatigue HEAD: Normocephalic, atraumatic.  EYES:  Pupils equal, round, reactive to light. Extraocular muscles intact. No scleral icterus.  MOUTH: Moist mucosal membrane.   EAR, NOSE, THROAT: Clear without exudates. No external lesions.  NECK: Supple. No thyromegaly. No nodules. No JVD.  PULMONARY:CTA B/L no wheezes, no crackles, no rhonchi CARDIOVASCULAR: S1 and S2. Regular rate and rhythm. No murmurs, rubs, or gallops. No edema.  GASTROINTESTINAL: Soft, nontender, nondistended. No masses. Positive bowel sounds.  MUSCULOSKELETAL: No swelling, clubbing, or edema. Range of motion full in all extremities.  NEUROLOGIC: Cranial nerves II through XII are intact. No gross focal neurological deficits.  SKIN: No ulceration, lesions, rashes, or cyanosis. Skin warm and dry. Turgor intact.  PSYCHIATRIC: Mood, affect within normal limits. The patient is awake, alert and oriented x 3. Insight, judgment intact.     CT chest 08/2016 I have Independently reviewed images of  CT chest   on 09/15/2016 Interpretation:RML lobe opacity, probable atalectasis  ASSESSMENT / PLAN: 46 year old pleasant white female with extensive tobacco abuse with signs and symptoms of shortness of breath wheezing or productive cough which strongly correlates with her underlying COPD as confirmed with office spirometry with FEV1 of 78% which is classified as 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 in the setting of allergic rhinitis  with abnormal CT chest findings of right middle lobe opacity likely mucous plugging with atelectasis   #1 for shortness of breath and wheezing and productive cough likely related to COPD/ chronic bronchitis -Recommend smoking cessation -Will need to check 6 minute walk as well as overnight pulse oximetry  #2 moderate to severe COPD Continue Advair 250/50 twice daily as prescribed Continue Ventolin as needed Continue Zyrtec  #3 abnormal CT finding Right middle lobe opacity/atelectasis likely from mucous plugging Will need to follow-up CT chest in 3 months  to assess for resolution of right middle lobe opacity  #4 smoking cessation strongly advised Counseled for approximately 5 minutes    Patient  satisfied with Plan of action and management. All questions answered Follow-up in 3 months with CT of the chest 6 minute walk test and ONO   Lucie Leather, M.D.  [facility]  Medical Director [facility] Medical Director [facility] Cardio-Pulmonary Department

## 2016-09-21 ENCOUNTER — Telehealth: Payer: Self-pay | Admitting: Pharmacist

## 2016-09-21 NOTE — Telephone Encounter (Signed)
09/21/16 Faxed [employer] application for enrollment for Ventolin HFA Inhale 2-4 puffs every four hours as needed for wheezing,cough or shortness of breath. Also Advair 250/50 Inhale one puff into to the lungs two times a day.

## 2016-09-23 ENCOUNTER — Ambulatory Visit: Payer: Self-pay | Admitting: Ophthalmology

## 2016-09-24 ENCOUNTER — Encounter: Payer: Self-pay | Admitting: Emergency Medicine

## 2016-09-24 DIAGNOSIS — I8002 Phlebitis and thrombophlebitis of superficial vessels of left lower extremity: Secondary | ICD-10-CM | POA: Insufficient documentation

## 2016-09-24 DIAGNOSIS — Z79899 Other long term (current) drug therapy: Secondary | ICD-10-CM | POA: Insufficient documentation

## 2016-09-24 DIAGNOSIS — F17210 Nicotine dependence, cigarettes, uncomplicated: Secondary | ICD-10-CM | POA: Insufficient documentation

## 2016-09-24 DIAGNOSIS — J441 Chronic obstructive pulmonary disease with (acute) exacerbation: Secondary | ICD-10-CM | POA: Insufficient documentation

## 2016-09-24 DIAGNOSIS — J45909 Unspecified asthma, uncomplicated: Secondary | ICD-10-CM | POA: Insufficient documentation

## 2016-09-24 NOTE — ED Triage Notes (Signed)
Patient ambulatory to triage with steady gait, without difficulty or distress noted; pt reports pain and swelling to left calf last several days +PP, W&D

## 2016-09-25 ENCOUNTER — Emergency Department
Admission: EM | Admit: 2016-09-25 | Discharge: 2016-09-25 | Disposition: A | Discharge status: Home / Self Care | Payer: Self-pay | Attending: Emergency Medicine | Admitting: Emergency Medicine

## 2016-09-25 ENCOUNTER — Emergency Department: Payer: Self-pay

## 2016-09-25 DIAGNOSIS — I809 Phlebitis and thrombophlebitis of unspecified site: Secondary | ICD-10-CM

## 2016-09-25 MED ORDER — IBUPROFEN 800 MG PO TABS
800.0000 mg | ORAL_TABLET | Freq: Once | ORAL | Status: AC
  Administered 2016-09-25: 800 mg via ORAL
  Filled 2016-09-25: qty 1

## 2016-09-25 MED ORDER — LIDOCAINE 5 % EX PTCH
1.0000 | MEDICATED_PATCH | CUTANEOUS | Status: DC
  Administered 2016-09-25: 1 via TRANSDERMAL
  Filled 2016-09-25: qty 1

## 2016-09-25 NOTE — ED Notes (Signed)

## 2016-09-25 NOTE — ED Provider Notes (Signed)
[facility] Emergency Department Provider Note    First MD Initiated Contact with Patient 09/25/16 0119     (approximate)  I have reviewed the triage vital signs and the nursing notes.   HISTORY  Chief Complaint Leg Pain    HPI Melissa Dean is a 46 y.o. female with below list of chronic medical conditions presents to the emergency department with history of nontraumatic left calf pain 3 days. Patient denies any chest pain no shortness of breath. Patient denies any fever. Patient states that she notes area of pain in the area of a varicose vein. No previous history of DVT or PE.   Past Medical History:  Diagnosis Date  . Anemia   . Anxiety   . Asthma   . Bronchitis   . COPD (chronic obstructive pulmonary disease) (HCC)   . Depression     Patient Active Problem List   Diagnosis Date Noted  . Acute respiratory failure with hypoxia (HCC) 07/04/2016  . COPD with acute exacerbation (HCC) 07/04/2016  . Acute bronchitis 07/04/2016  . Anxiety state 07/18/2014  . Tobacco abuse 07/18/2014  . Abnormal TSH 07/18/2014  . Atypical chest pain 07/18/2014  . Chest pain 07/06/2014    Past Surgical History:  Procedure Laterality Date  . CESAREAN SECTION    . CESAREAN SECTION    . CHOLECYSTECTOMY    . TONSILLECTOMY    . TUBAL LIGATION      Prior to Admission medications   Medication Sig Start Date End Date Taking? Authorizing Provider  albuterol (PROVENTIL HFA;VENTOLIN HFA) 108 (90 Base) MCG/ACT inhaler Inhale 2-4 puffs by mouth every 4 hours as needed for wheezing, cough, and/or shortness of breath 08/04/16   McGowan, Carollee Herter A, PA-C  cetirizine (ZYRTEC) 10 MG tablet Take 1 tablet (10 mg total) by mouth daily. 08/16/16   Sharman Cheek, MD  Fluticasone-Salmeterol (ADVAIR) 250-50 MCG/DOSE AEPB Inhale 1 puff into the lungs 2 (two) times daily. 08/20/16   Doles-Johnson, Teah, NP    Allergies No known drug allergies  Family History  Problem Relation  Age of Onset  . Heart disease Father   . Hypertension Father   . Diabetes Father   . Cancer Father        lung cancer  . Diabetes Brother   . Hypertension Paternal Grandfather   . Diabetes Paternal Grandfather   . Thyroid disease Unknown     Social History Social History  Substance Use Topics  . Smoking status: Current Some Day Smoker    Packs/day: 0.50    Years: 20.00  . Smokeless tobacco: Never Used     Comment: says she still smokes 1-2 every once in awhile  . Alcohol use No    Review of Systems Constitutional: No fever/chills Eyes: No visual changes. ENT: No sore throat. Cardiovascular: Denies chest pain. Respiratory: Denies shortness of breath. Gastrointestinal: No abdominal pain.  No nausea, no vomiting.  No diarrhea.  No constipation. Genitourinary: Negative for dysuria. Musculoskeletal: Negative for neck pain.  Negative for back pain.Positive for left calf pain Integumentary: Negative for rash. Neurological: Negative for headaches, focal weakness or numbness.   ____________________________________________   PHYSICAL EXAM:  VITAL SIGNS: ED Triage Vitals  Enc Vitals Group     BP 09/24/16 2356 131/88     Pulse Rate 09/24/16 2356 72     Resp 09/24/16 2356 18     Temp 09/24/16 2356 97.8 F (36.6 C)     Temp src --  SpO2 09/24/16 2356 99 %     Weight 09/24/16 2355 57.2 kg (126 lb)     Height 09/24/16 2355 1.575 m (5\' 2" )     Head Circumference --      Peak Flow --      Pain Score 09/24/16 2355 6     Pain Loc --      Pain Edu? --      Excl. in GC? --     Constitutional: Alert and oriented. Well appearing and in no acute distress. Eyes: Conjunctivae are normal.  Head: Atraumatic. Mouth/Throat: Mucous membranes are moist. Neck: No stridor.  Cardiovascular: Normal rate, regular rhythm. Good peripheral circulation. Grossly normal heart sounds. Respiratory: Normal respiratory effort.  No retractions. Lungs CTAB. Gastrointestinal: Soft and nontender.  No distention.  Musculoskeletal: Visible varicose vein noted medial aspect of the left calf with tenderness to palpation. Neurologic:  Normal speech and language. No gross focal neurologic deficits are appreciated.  Skin:  Skin is warm, dry and intact. No rash noted. Psychiatric: Mood and affect are normal. Speech and behavior are normal.   RADIOLOGY I, [place] Maycol Hoying, personally viewed and evaluated these images (plain radiographs) as part of my medical decision making, as well as reviewing the written report by the radiologist.  US Venous Img Lower Unilateral Left  Result Date: 09/25/2016 CLINICAL DATA:  Left lower extremity pain. EXAM: LEFT LOWER EXTREMITY VENOUS DOPPLER ULTRASOUND TECHNIQUE: Gray-scale sonography with graded compression, as well as color Doppler and duplex ultrasound were performed to evaluate the lower extremity deep venous systems from the level of the common femoral vein and including the common femoral, femoral, profunda femoral, popliteal and calf veins including the posterior tibial, peroneal and gastrocnemius veins when visible. The superficial great saphenous vein was also interrogated. Spectral Doppler was utilized to evaluate flow at rest and with distal augmentation maneuvers in the common femoral, femoral and popliteal veins. COMPARISON:  None. FINDINGS: Contralateral Common Femoral Vein: Respiratory phasicity is normal and symmetric with the symptomatic side. No evidence of thrombus. Normal compressibility. Common Femoral Vein: No evidence of thrombus. Normal compressibility, respiratory phasicity and response to augmentation. Saphenofemoral Junction: No evidence of thrombus. Normal compressibility and flow on color Doppler imaging. Profunda Femoral Vein: No evidence of thrombus. Normal compressibility and flow on color Doppler imaging. Femoral Vein: No evidence of thrombus. Normal compressibility, respiratory phasicity and response to augmentation. Popliteal Vein: No  evidence of thrombus. Normal compressibility, respiratory phasicity and response to augmentation. Calf Veins: No evidence of thrombus. Normal compressibility and flow on color Doppler imaging. Superficial Great Saphenous Vein: No evidence of thrombus. Normal compressibility and flow on color Doppler imaging. Venous Reflux:  None. Other Findings:  None. IMPRESSION: No evidence of DVT within the left lower extremity. Electronically Signed   By: Rubye Oaks M.D.   On: 09/25/2016 00:45     Procedures   ____________________________________________   INITIAL IMPRESSION / ASSESSMENT AND PLAN / ED COURSE  Pertinent labs & imaging results that were available during my care of the patient were reviewed by me and considered in my medical decision making (see chart for details).  46 year old female presenting with left calf pain history of physical exam consistent with superficial phlebitis. Lidoderm patch applied patient given ibuprofen 800 mg    ____________________________________________  FINAL CLINICAL IMPRESSION(S) / ED DIAGNOSES  Final diagnoses:  Superficial phlebitis     MEDICATIONS GIVEN DURING THIS VISIT:  Medications  ibuprofen (ADVIL,MOTRIN) tablet 800 mg (800 mg Oral Given 09/25/16 0133)  NEW OUTPATIENT MEDICATIONS STARTED DURING THIS VISIT:  Discharge Medication List as of 09/25/2016  1:34 AM      Discharge Medication List as of 09/25/2016  1:34 AM      Discharge Medication List as of 09/25/2016  1:34 AM       Note:  This document was prepared using Dragon voice recognition software and may include unintentional dictation errors.    Darci CurrentBrown, [place] N, MD 09/25/16 [phone]

## 2016-10-06 ENCOUNTER — Ambulatory Visit: Payer: Self-pay | Admitting: Adult Health Nurse Practitioner

## 2016-10-06 VITALS — BP 121/75 | HR 69 | Temp 98.0°F | Ht 62.0 in | Wt 128.6 lb

## 2016-10-06 DIAGNOSIS — J449 Chronic obstructive pulmonary disease, unspecified: Secondary | ICD-10-CM

## 2016-10-06 NOTE — Progress Notes (Signed)
  Patient: Melissa JohnsValerie Dean Female    DOB: January 08, 1971   46 y.o.   MRN: [id number] Visit Date: 10/06/2016  Today's Provider: Jacelyn Pieah Doles-Johnson, NP   Chief Complaint  Patient presents with  . Follow-up   Subjective:    HPI  Seen in the ED on 6.22.18 with superficial phlebitis.   treated with ibuprofen.   RTO today for FU of COPD.  On Advair with Zyrtec at night.  Has not had to use inhaler but a couple times since last visit.  Saw pulmonologist on 6.12- has to do 6 minute walk test and overnight sleep study.  FU in Sept for CT scan due to spot on lungs.   Pt states that she quit smoking on 3.31.18- has had a few cigarettes since.    No Known Allergies Previous Medications   ALBUTEROL (PROVENTIL HFA;VENTOLIN HFA) 108 (90 BASE) MCG/ACT INHALER    Inhale 2-4 puffs by mouth every 4 hours as needed for wheezing, cough, and/or shortness of breath   CETIRIZINE (ZYRTEC) 10 MG TABLET    Take 1 tablet (10 mg total) by mouth daily.   FLUTICASONE-SALMETEROL (ADVAIR) 250-50 MCG/DOSE AEPB    Inhale 1 puff into the lungs 2 (two) times daily.    Review of Systems  All other systems reviewed and are negative.   Social History  Substance Use Topics  . Smoking status: Current Some Day Smoker    Packs/day: 0.50    Years: 20.00  . Smokeless tobacco: Never Used     Comment: says she still smokes 1-2 every once in awhile  . Alcohol use No   Objective:   BP 121/75   Pulse 69   Temp 98 F (36.7 C)   Ht 5\' 2"  (1.575 m)   Wt 128 lb 9.6 oz (58.3 kg)   BMI 23.52 kg/m   Physical Exam  Constitutional: She appears well-developed and well-nourished.  Cardiovascular: Normal rate, regular rhythm and normal heart sounds.   Pulmonary/Chest: Effort normal. She has decreased breath sounds.  Vitals reviewed.       Assessment & Plan:        Phlebitis:  Resolved.   COPD:  Continue current regimen.  FU with pulmonologist as indicated.    Jacelyn Pieah Doles-Johnson, NP   [facility]  [place]

## 2016-10-13 ENCOUNTER — Ambulatory Visit (INDEPENDENT_AMBULATORY_CARE_PROVIDER_SITE_OTHER): Payer: Self-pay | Admitting: *Deleted

## 2016-10-13 DIAGNOSIS — J9601 Acute respiratory failure with hypoxia: Secondary | ICD-10-CM

## 2016-10-13 DIAGNOSIS — J449 Chronic obstructive pulmonary disease, unspecified: Secondary | ICD-10-CM

## 2016-10-13 NOTE — Patient Instructions (Signed)
Patient follow up already scheduled.

## 2016-10-13 NOTE — Progress Notes (Signed)
SIX MIN WALK 10/13/2016  Medications Advair 250/50  Supplimental Oxygen during Test? (L/min) No  Laps 6  Partial Lap (in Meters) 2  Baseline BP (sitting) 100/70  Baseline Heartrate 63  Baseline Dyspnea (Borg Scale) 0  Baseline Fatigue (Borg Scale) 4  Baseline SPO2 97  BP (sitting) 100/68  Heartrate 73  Dyspnea (Borg Scale) 0.5  Fatigue (Borg Scale) 4  SPO2 96  BP (sitting) 100/72  Heartrate 71  SPO2 94  Stopped or Paused before Six Minutes No  Distance Completed 290  Tech Comments: Pt walked at normal steady walking pace. Pt did not de-sat.     SMW performed today.

## 2016-10-15 ENCOUNTER — Ambulatory Visit: Payer: Self-pay | Admitting: Ophthalmology

## 2016-11-16 ENCOUNTER — Other Ambulatory Visit: Payer: Self-pay | Admitting: Internal Medicine

## 2016-11-17 ENCOUNTER — Telehealth: Payer: Self-pay

## 2016-11-17 NOTE — Telephone Encounter (Signed)
[facility] and Albuterol inhalers increasing her anxiety. MD advice was to continue inhalers and schedule appt ASAP to reconsider meds. Pt was called and scheduled an appt for Tuesday, 12/01/16.

## 2016-11-27 ENCOUNTER — Ambulatory Visit
Admission: RE | Admit: 2016-11-27 | Discharge: 2016-11-27 | Disposition: A | Discharge status: Home / Self Care | Payer: Self-pay | Source: Ambulatory Visit | Attending: Oncology | Admitting: Oncology

## 2016-11-27 DIAGNOSIS — R920 Mammographic microcalcification found on diagnostic imaging of breast: Secondary | ICD-10-CM

## 2016-12-01 ENCOUNTER — Ambulatory Visit: Payer: Self-pay | Admitting: Adult Health Nurse Practitioner

## 2016-12-01 VITALS — BP 106/61 | HR 84 | Temp 98.6°F | Ht 62.0 in | Wt 127.8 lb

## 2016-12-01 DIAGNOSIS — F411 Generalized anxiety disorder: Secondary | ICD-10-CM

## 2016-12-01 DIAGNOSIS — J449 Chronic obstructive pulmonary disease, unspecified: Secondary | ICD-10-CM

## 2016-12-01 NOTE — Progress Notes (Signed)
   Subjective:    Patient ID: Melissa Dean, female    DOB: [birth year], 46 y.o.   MRN: [id number]  HPI   Pt is here for medication follow up and consultation.  Pt reports her anxiety and panic attacks have become worse.  Pt states that the advair works well to control her symptoms.  She states that she has been on celexa, valium and xanax before and she does not desire any medications for management of anxiety.   Patient Active Problem List   Diagnosis Date Noted  . Acute respiratory failure with hypoxia (HCC) 07/04/2016  . COPD (chronic obstructive pulmonary disease) (HCC) 07/04/2016  . Acute bronchitis 07/04/2016  . Anxiety state 07/18/2014  . Tobacco abuse 07/18/2014  . Abnormal TSH 07/18/2014  . Atypical chest pain 07/18/2014  . Chest pain 07/06/2014   Allergies as of 12/01/2016   No Known Allergies     Medication List       Accurate as of 12/01/16  6:13 PM. Always use your most recent med list.          albuterol 108 (90 Base) MCG/ACT inhaler Commonly known as:  PROVENTIL HFA;VENTOLIN HFA Inhale 2-4 puffs by mouth every 4 hours as needed for wheezing, cough, and/or shortness of breath   cetirizine 10 MG tablet Commonly known as:  ZYRTEC TAKE ONE TABLET BY MOUTH AT BEDTIME   Fluticasone-Salmeterol 250-50 MCG/DOSE Aepb Commonly known as:  ADVAIR Inhale 1 puff into the lungs 2 (two) times daily.        Review of Systems  Pt reports she does have anxiety and does not take med for it and she doesn't want to. She has been on med for depression daily. She does not see a therapist but is open to being referred.  Pt reports she does not take albuterol often but has to take the advair daily.  Pt reports she has started smoking again.      Objective:   Physical Exam  Constitutional: She is oriented to person, place, and time.  Cardiovascular: Normal rate, regular rhythm and normal heart sounds.   Pulmonary/Chest: Effort normal and breath sounds normal.   Neurological: She is alert and oriented to person, place, and time.    BP 106/61 (BP Location: Left Arm, Patient Position: Sitting, Cuff Size: Normal)   Pulse 84   Temp 98.6 F (37 C)   Ht 5\' 2"  (1.575 m)   Wt 127 lb 12.8 oz (58 kg)   LMP 11/26/2016   BMI 23.37 kg/m         Assessment & Plan:   Referral to Phil (Psych) for anxiety management.  Continue COPD medications.

## 2016-12-10 ENCOUNTER — Ambulatory Visit: Payer: Self-pay | Admitting: Licensed Clinical Social Worker

## 2016-12-15 ENCOUNTER — Ambulatory Visit
Admission: RE | Admit: 2016-12-15 | Discharge: 2016-12-15 | Disposition: A | Discharge status: Home / Self Care | Payer: Self-pay | Source: Ambulatory Visit | Attending: Internal Medicine | Admitting: Internal Medicine

## 2016-12-15 DIAGNOSIS — I700 Atherosclerosis of aorta: Secondary | ICD-10-CM | POA: Insufficient documentation

## 2016-12-15 DIAGNOSIS — J449 Chronic obstructive pulmonary disease, unspecified: Secondary | ICD-10-CM | POA: Insufficient documentation

## 2016-12-16 ENCOUNTER — Telehealth: Payer: Self-pay

## 2016-12-16 NOTE — Telephone Encounter (Signed)
Clinic will be closed on 12/17/16 due to weather and lack of providers.  Called patient at home number and left message to let he know her eye appt at 4pm on 9/13 had been cancelled.  Instructed pt to call us at her convenience to reschedule at a later time.

## 2016-12-17 ENCOUNTER — Ambulatory Visit: Payer: Self-pay | Admitting: Ophthalmology

## 2016-12-17 ENCOUNTER — Ambulatory Visit: Payer: Self-pay | Admitting: Licensed Clinical Social Worker

## 2016-12-22 ENCOUNTER — Ambulatory Visit (INDEPENDENT_AMBULATORY_CARE_PROVIDER_SITE_OTHER): Payer: Self-pay | Admitting: Internal Medicine

## 2016-12-22 ENCOUNTER — Encounter: Payer: Self-pay | Admitting: Internal Medicine

## 2016-12-22 VITALS — BP 100/70 | HR 71 | Resp 16 | Ht 62.0 in | Wt 126.0 lb

## 2016-12-22 DIAGNOSIS — J449 Chronic obstructive pulmonary disease, unspecified: Secondary | ICD-10-CM

## 2016-12-22 NOTE — Patient Instructions (Signed)
Stop smoking Continue inhalers as prescribed  

## 2016-12-22 NOTE — Progress Notes (Signed)
Name: Melissa Dean MRN: [id number] DOB: 05-Jan-1971     CONSULTATION DATE: 12/22/2016  REFERRING MD :  Michiel Cowboy  CHIEF COMPLAINT:  SOB  PATIENT PROFILE 46 yo female seen today for shortness of breath associated with wheezing coughing and chest congestion with intermittent productive cough,  -Patient has been in and out of the hospital since October of last year with recurrent bouts of COPD/asthma exacerbations -Patient has a history of extensive tobacco abuse she states she quit May 31 but has had several cigarettes since then she has smoked one pack a day for 20 years - she works at [employer] she is not around secondhand smoke -she has a family history of lung cancer-father  Died  Patient states she has been previously diagnosed with COPD and asthma Patient obtained CT of the chest in May 2018 which showed a right middle lobe opacification likely related to mucous plugging and atelectasis CT scans reviewed with patient   HPI Patient does not have acute respiratory symptoms at this time No signs of infection at this time No signs of CHF at this time Doing really well on Advair No wheezing, no coughing She has significantly decreased her smoking    Office spiro last OV FEV1/FVC ratio is 68% with FEV1 of 2.1 L 78% predicted FEF 25/75 is 1.2 L with 43% predicted findings concerning for moderate to severe obstructive airway disease, flow volume loops suggest scooping of the expiratory limb which is suggestive of obstruction  REVIEW OF SYSTEMS:   Constitutional: Negative for fever, chills, weight loss, malaise/fatigue and diaphoresis.  HENT: Negative for hearing loss, ear pain, nosebleeds, congestion, sore throat, neck pain, tinnitus and ear discharge.   Eyes: Negative for blurred vision, double vision, photophobia, pain, discharge and redness.  Respiratory: +cough, -hemoptysis, +sputum production, +shortness of breath, +wheezing and -stridor.     Cardiovascular: Negative for chest pain, palpitations, orthopnea, claudication, leg swelling and PND.  ALL OTHER ROS ARE NEGATIVE  BP 100/70 (BP Location: Left Arm, Cuff Size: Normal)   Pulse 71   Resp 16   Ht  (1.575 m)   Wt 126 lb (57.2 kg)   LMP 11/30/2016 (Exact Date) Comment: Hx BTL  SpO2 98%   BMI 23.05 kg/m    Physical Examination:   GENERAL:NAD, no fevers, chills, no weakness no fatigue HEAD: Normocephalic, atraumatic.  EYES: Pupils equal, round, reactive to light. Extraocular muscles intact. No scleral icterus.  MOUTH: Moist mucosal membrane.   EAR, NOSE, THROAT: Clear without exudates. No external lesions.  NECK: Supple. No thyromegaly. No nodules. No JVD.  PULMONARY:CTA B/L no wheezes, no crackles, no rhonchi CARDIOVASCULAR: S1 and S2. Regular rate and rhythm. No murmurs, rubs, or gallops. No edema.  MUSCULOSKELETAL: No swelling, clubbing, or edema. Range of motion full in all extremities.  NEUROLOGIC: Cranial nerves II through XII are intact. No gross focal neurological deficits.  SKIN: No ulceration, lesions, rashes, or cyanosis. Skin warm and dry. Turgor intact.  PSYCHIATRIC: Mood, affect within normal limits. The patient is awake, alert and oriented x 3. Insight, judgment intact.     CT chest 08/2016 I have Independently reviewed images of  CT chest   on 12/22/2016 Interpretation:RML lobe opacity, probable atalectasis   CT chest 12/15/16 I have Independently reviewed images of  CT chest   on 12/22/2016 Interpretation: There is complete resolution of the right middle lobe opacification No acute findings  6 minute walk test was negative for hypoxia  The CT  scan findings and the 6 minute walk test were discussed with the patient results reviewed with the patient and explained to her findings  ASSESSMENT / PLAN: 46 year old pleasant white female with extensive tobacco abuse with signs and symptoms of shortness of breath wheezing or productive cough which  strongly correlates with her underlying COPD as confirmed with office spirometry with FEV1 of 78% which is classified as 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 in the setting of allergic rhinitis with abnormal CT chest findings 5/18 of right middle lobe opacity likely mucous plugging with atelectasis which has completely resolved based on the current CT scan on 12/15/16   #1 for shortness of breath and wheezing and productive cough likely related to COPD/ chronic bronchitis -Recommend smoking cessation   #2 moderate to severe COPD Continue Advair 250/50 twice daily as prescribed Continue Ventolin as needed Continue Zyrtec  #3 abnormal CT finding Right middle lobe opacity/atelectasis likely from mucous plugging Will need to follow-up CT chest in 3 months to assess for resolution of right middle lobe opacity  #4 smoking cessation strongly advised Counseled for approximately 5 minutes    Patient  satisfied with Plan of action and management. All questions answered Follow-up in 6 months with plan to decrease dosage of Advair.    Lucie Leather, M.D.  [facility]  Medical Director [facility] Medical Director [facility] Cardio-Pulmonary Department

## 2016-12-23 ENCOUNTER — Encounter: Payer: Self-pay | Admitting: *Deleted

## 2016-12-23 NOTE — Progress Notes (Signed)
Patient's six month follow-up mammogram was a birads 3.  Mailed patient a letter with next appointment scheduled for May 26, 2017 @ 8:00.  HSIS to [facility].

## 2017-01-07 ENCOUNTER — Ambulatory Visit: Payer: Self-pay | Admitting: Licensed Clinical Social Worker

## 2017-01-11 ENCOUNTER — Emergency Department
Admission: EM | Admit: 2017-01-11 | Discharge: 2017-01-11 | Disposition: A | Discharge status: Home / Self Care | Payer: Self-pay | Attending: Emergency Medicine | Admitting: Emergency Medicine

## 2017-01-11 ENCOUNTER — Encounter: Payer: Self-pay | Admitting: *Deleted

## 2017-01-11 DIAGNOSIS — Z79899 Other long term (current) drug therapy: Secondary | ICD-10-CM | POA: Insufficient documentation

## 2017-01-11 DIAGNOSIS — J45909 Unspecified asthma, uncomplicated: Secondary | ICD-10-CM | POA: Insufficient documentation

## 2017-01-11 DIAGNOSIS — J449 Chronic obstructive pulmonary disease, unspecified: Secondary | ICD-10-CM | POA: Insufficient documentation

## 2017-01-11 DIAGNOSIS — N390 Urinary tract infection, site not specified: Secondary | ICD-10-CM | POA: Insufficient documentation

## 2017-01-11 DIAGNOSIS — R319 Hematuria, unspecified: Secondary | ICD-10-CM | POA: Insufficient documentation

## 2017-01-11 DIAGNOSIS — Z87891 Personal history of nicotine dependence: Secondary | ICD-10-CM | POA: Insufficient documentation

## 2017-01-11 LAB — POCT PREGNANCY, URINE: PREG TEST UR: NEGATIVE

## 2017-01-11 LAB — URINALYSIS, COMPLETE (UACMP) WITH MICROSCOPIC
BILIRUBIN URINE: NEGATIVE
Glucose, UA: NEGATIVE mg/dL
KETONES UR: NEGATIVE mg/dL
Nitrite: NEGATIVE
Protein, ur: NEGATIVE mg/dL
Specific Gravity, Urine: 1.025 (ref 1.005–1.030)
pH: 5 (ref 5.0–8.0)

## 2017-01-11 MED ORDER — TRAMADOL HCL 50 MG PO TABS: 50.0000 mg | ORAL_TABLET | Freq: Four times a day (QID) | ORAL | 0 refills | Status: AC | PRN

## 2017-01-11 MED ORDER — CIPROFLOXACIN HCL 500 MG PO TABS: 500.0000 mg | ORAL_TABLET | Freq: Two times a day (BID) | ORAL | 0 refills | Status: DC

## 2017-01-11 MED ORDER — CIPROFLOXACIN HCL 500 MG PO TABS
500.0000 mg | ORAL_TABLET | Freq: Once | ORAL | Status: AC
  Administered 2017-01-11: 500 mg via ORAL

## 2017-01-11 MED ORDER — CIPROFLOXACIN HCL 500 MG PO TABS
ORAL_TABLET | ORAL | Status: AC
  Filled 2017-01-11: qty 1

## 2017-01-11 NOTE — ED Provider Notes (Signed)
[facility] Emergency Department Provider Note   ____________________________________________    I have reviewed the triage vital signs and the nursing notes.   HISTORY  Chief Complaint Back Pain     HPI Melissa Dean is a 46 y.o. female Who presents with complaints of left-sided back pain. Patient reports a history of kidney infections in the past so when she started developing some urinary frequency and mild left back pain she came to the ED immediately. She denies fevers or chills. No nausea or vomiting. No abdominal pain. She has not taken anything for this. Symptoms started 1 day ago   Past Medical History:  Diagnosis Date  . Anemia   . Anxiety   . Asthma   . Bronchitis   . COPD (chronic obstructive pulmonary disease) (HCC)   . Depression     Patient Active Problem List   Diagnosis Date Noted  . Acute respiratory failure with hypoxia (HCC) 07/04/2016  . COPD (chronic obstructive pulmonary disease) (HCC) 07/04/2016  . Acute bronchitis 07/04/2016  . Anxiety state 07/18/2014  . Tobacco abuse 07/18/2014  . Abnormal TSH 07/18/2014  . Atypical chest pain 07/18/2014  . Chest pain 07/06/2014    Past Surgical History:  Procedure Laterality Date  . BREAST BIOPSY Right 05/24/2016   PSEUDO-ANGIOMATOUS STROMAL HYPERPLASIA. NEGATIVE FOR ATYPIA  . CESAREAN SECTION    . CESAREAN SECTION    . CHOLECYSTECTOMY    . TONSILLECTOMY    . TUBAL LIGATION      Prior to Admission medications   Medication Sig Start Date End Date Taking? Authorizing Provider  albuterol (PROVENTIL HFA;VENTOLIN HFA) 108 (90 Base) MCG/ACT inhaler Inhale 2-4 puffs by mouth every 4 hours as needed for wheezing, cough, and/or shortness of breath 08/04/16   McGowan, Carollee Herter A, PA-C  cetirizine (ZYRTEC) 10 MG tablet TAKE ONE TABLET BY MOUTH AT BEDTIME 11/17/16   Virl Axe, MD  ciprofloxacin (CIPRO) 500 MG tablet Take 1 tablet (500 mg total) by mouth 2 (two) times daily.  01/11/17   Jene Every, MD  Fluticasone-Salmeterol (ADVAIR) 250-50 MCG/DOSE AEPB Inhale 1 puff into the lungs 2 (two) times daily. 08/20/16   Doles-Johnson, Teah, NP  traMADol (ULTRAM) 50 MG tablet Take 1 tablet (50 mg total) by mouth every 6 (six) hours as needed. 01/11/17 01/11/18  Jene Every, MD     Allergies Patient has no known allergies.  Family History  Problem Relation Age of Onset  . Heart disease Father   . Hypertension Father   . Diabetes Father   . Cancer Father        lung cancer  . Diabetes Brother   . Hypertension Paternal Grandfather   . Diabetes Paternal Grandfather   . Thyroid disease Unknown     Social History Social History  Substance Use Topics  . Smoking status: Current Some Day Smoker    Packs/day: 0.25    Years: 20.00  . Smokeless tobacco: Never Used     Comment: says she still smokes 1-2 every once in awhile  . Alcohol use No    Review of Systems  Constitutional: No fever/chills Eyes: No visual changes.  ENT: No sore throat. Cardiovascular: Denies chest pain. Respiratory: Denies shortness of breath. Gastrointestinal: No abdominal pain.  No nausea, no vomiting.   Genitourinary: frequency, no dysuria Musculoskeletal: mild left-sided back pain Skin: Negative for rash. Neurological: Negative for headaches    ____________________________________________   PHYSICAL EXAM:  VITAL SIGNS: ED Triage Vitals  Enc  Vitals Group     BP 01/11/17 1930 (!) 122/59     Pulse Rate 01/11/17 1930 62     Resp 01/11/17 1930 18     Temp 01/11/17 1930 98.3 F (36.8 C)     Temp Source 01/11/17 1930 Oral     SpO2 01/11/17 1930 98 %     Weight 01/11/17 1931 57.2 kg (126 lb)     Height 01/11/17 1931 1.575 m ( )     Head Circumference --      Peak Flow --      Pain Score 01/11/17 1930 7     Pain Loc --      Pain Edu? --      Excl. in GC? --     Constitutional: Alert and oriented. No acute distress. Pleasant and interactive  Nose: No  congestion/rhinnorhea. Mouth/Throat: Mucous membranes are moist.    Cardiovascular: Normal rate, regular rhythm. Grossly normal heart sounds.  Good peripheral circulation. Respiratory: Normal respiratory effort.  No retractions. Lungs CTAB. Gastrointestinal: Soft and nontender. No distention.  No CVA tenderness.reassuring exam Genitourinary: deferred Musculoskeletal: No lower extremity tenderness nor edema.  Warm and well perfused Neurologic:  Normal speech and language. No gross focal neurologic deficits are appreciated.  Skin:  Skin is warm, dry and intact. No rash noted. Psychiatric: Mood and affect are normal. Speech and behavior are normal.  ____________________________________________   LABS (all labs ordered are listed, but only abnormal results are displayed)  Labs Reviewed  URINALYSIS, COMPLETE (UACMP) WITH MICROSCOPIC - Abnormal; Notable for the following:       Result Value   Color, Urine YELLOW (*)    APPearance CLEAR (*)    Hgb urine dipstick MODERATE (*)    Leukocytes, UA SMALL (*)    Bacteria, UA MANY (*)    Squamous Epithelial / LPF 0-5 (*)    All other components within normal limits  URINE CULTURE  POC URINE PREG, ED  POCT PREGNANCY, URINE   ____________________________________________  EKG  None ____________________________________________  RADIOLOGY  None ____________________________________________   PROCEDURES  Procedure(s) performed: No    Critical Care performed: No ____________________________________________   INITIAL IMPRESSION / ASSESSMENT AND PLAN / ED COURSE  Pertinent labs & imaging results that were available during my care of the patient were reviewed by me and considered in my medical decision making (see chart for details).  patient well-appearing and in no acute distress.  Differential diagnosis includes urinary tract infection, muscular skull to back pain, pyelonephritis  Patient overall well-appearing, a VSS,  unremarkable exam. No tachycardia. No nausea or vomiting. Suspect urinary tract infection we'll treat with ciprofloxacin, return precautions discussed. Urine culture sent    ____________________________________________   FINAL CLINICAL IMPRESSION(S) / ED DIAGNOSES  Final diagnoses:  Urinary tract infection with hematuria, site unspecified      NEW MEDICATIONS STARTED DURING THIS VISIT:  Discharge Medication List as of 01/11/2017  8:09 PM    START taking these medications   Details  ciprofloxacin (CIPRO) 500 MG tablet Take 1 tablet (500 mg total) by mouth 2 (two) times daily., Starting Mon 01/11/2017, Print    traMADol (ULTRAM) 50 MG tablet Take 1 tablet (50 mg total) by mouth every 6 (six) hours as needed., Starting Mon 01/11/2017, Until Tue 01/11/2018, Print         Note:  This document was prepared using Dragon voice recognition software and may include unintentional dictation errors.    Jene Every, MD 01/11/17 2249

## 2017-01-11 NOTE — ED Triage Notes (Signed)
Pt has left lower back pain.  Sx for 2 days.  Recent kidney infections.  No n/v/d.  Pt alert.  No known injury to back

## 2017-01-13 ENCOUNTER — Telehealth: Payer: Self-pay | Admitting: Pharmacist

## 2017-01-13 NOTE — Telephone Encounter (Signed)
01/13/17 Called [facility] to refill Advair VW#[id number] & Ventolin rx# [id number], allow 7-10 business days to receive, order# [id number]. Forde Radon

## 2017-01-14 LAB — URINE CULTURE: Culture: >=100000 — AB

## 2017-02-02 ENCOUNTER — Telehealth: Payer: Self-pay | Admitting: Adult Health Nurse Practitioner

## 2017-02-02 ENCOUNTER — Ambulatory Visit: Payer: Self-pay | Admitting: Licensed Clinical Social Worker

## 2017-02-02 NOTE — Telephone Encounter (Signed)
Called back and rescheduled appt.

## 2017-02-09 ENCOUNTER — Ambulatory Visit: Payer: Self-pay | Admitting: Licensed Clinical Social Worker

## 2017-02-09 DIAGNOSIS — F419 Anxiety disorder, unspecified: Secondary | ICD-10-CM

## 2017-02-09 DIAGNOSIS — Z599 Problem related to housing and economic circumstances, unspecified: Secondary | ICD-10-CM

## 2017-02-10 ENCOUNTER — Encounter: Payer: Self-pay | Admitting: Licensed Clinical Social Worker

## 2017-02-10 DIAGNOSIS — Z599 Problem related to housing and economic circumstances, unspecified: Secondary | ICD-10-CM | POA: Insufficient documentation

## 2017-02-10 NOTE — Progress Notes (Signed)
  Total time:1 hour Type of Service: Integrated Behavioral Health Updated Assessment Interpretor:No.   SUBJECTIVE: Melissa Dean is a 46 y.o. female  referred by self. for symptoms of:  anxiety. Patient is accompanied by self.  Patient reports the following symptoms and or concerns: family issues: stressors: financial concern,Marital or family conflict. Duration of problem:  Years Impact on function: Feeling overwhelmed and not practicing self care.  Current or Hx of substance use: Denies history of abusing or using substances. PSYCHIATRIC HISTORY - Diagnosis: Major Depressive disorder mild and Generalized anxiety disorder. - Hospitalizations/ Outpatient therapy:  Previously a patient at [facility] six years ago. Denies history of psychiatric hospitalizations. -Pharmacotherapy:Previously on Celexa, Valium, and is unable to remember the milligrams. -Family history of psychiatric issues: Denies a history of mental illness in the family.  OBJECTIVE:  AppearanceCasual; Mood: Anxious ; Thought process: Coherent; Affect: Appropriate Risk of harm to self or others: No plan to harm self or others   LIFE CONTEXT:  Family & Social:,patient lives with daughter, middle son, and granddaughter.  Focus is primarily on her kids. Divorced and single mom.  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  Life changes: Son's ex girlfriend is seeking full custody of her granddaughter.  GOALS ADDRESSED:  Patient will reduce symptoms of: anxiety; increase knowledge and/ or ability [id number]:self-management skills and stress reduction, practicing self care will also :Increase healthy adjustment to current life circumstances.  INTERVENTIONS:Brief Counseling/Psychotherapy and [employer] Resource, Brief CBT and Link to [employer] Resources, Copywriter, advertisingMindfulness or Relaxation Training , Reflective listening Standardized Assessments completed: PHQ 9=10,indication of: moderate depression. GAD-7=16 indication of: moderate anxiety.    ISSUES DISCUSSED: Integrated care services, support system, previous and current coping skills, community resources , community support, things patient enjoy or use to enjoy doing, practicing self care,  and taking time for herself.     ASSESSMENT:  Patient currently experiencing symptoms of  anxiety.  Symptoms exacerbated by son's ex girlfriend seeking full custody of granddaughter and son losing his job.  Patient may benefit from, and is in agreement to receive further assessment and brief therapeutic interventions to assist with managing stress.   PLAN: . Patient will F/U with Carey BullocksHeather Eilidh Marcano, LCSW. . LCSW will F/U with phone call not needed . Behavioral recommendations: Follow up at least once monthly for therapy. . Referral:Integrated Behavioral Health Services (In Clinic), resources provided for affordable housing. . From scale of 1-10, how likely are you to follow plan: 9  Warm Hand Off Completed.

## 2017-03-09 ENCOUNTER — Ambulatory Visit: Payer: Self-pay | Admitting: Licensed Clinical Social Worker

## 2017-03-09 DIAGNOSIS — Z599 Problem related to housing and economic circumstances, unspecified: Secondary | ICD-10-CM

## 2017-03-09 DIAGNOSIS — F419 Anxiety disorder, unspecified: Secondary | ICD-10-CM

## 2017-03-09 NOTE — Patient Instructions (Signed)
Follow up in one month at [facility] with Carey BullocksHeather Jalie Eiland LCSW for therapy.

## 2017-03-09 NOTE — Progress Notes (Signed)
Subjective:  Patient ID: Melissa Dean, female   DOB: [birth year], 46 y.o.   MRN: [id number]    Engage patient in aftercare planning with referrals and resources and Increase emotional regulation  Melissa Dean  presents with anxiety. Onset of symptoms was few years ago with gradually improving with depression. Symptoms have been occurringNot influenced by the time of the day.  Symptoms are currently rated moderate. Associated signs and symptoms include: excessive worrying, difficulty relaxing, and difficulty concentrating. Financial difficulties.   Melissa Dean reports that she received an eviction notice this morning and her court date is scheduled for December 14th. She notes that usually you have ten to fifteen days that you have to out. She expressed anxiety over finding another place but plans to look today. She notes that she needs to find something that is under her current rent that she can pay for without relying on the assistance of her son. She notes that her son did apply for food stamps which will help out financially but he won't actually get the card for seven to ten days. She reports that if she is forced to leave her apartment before she finds another place that she will stay with a friend. She notes that she is trying to stay positive and not stress over things. She denies suicidal and homicidal thoughts.   Therapeutic Interventions: Cognitive Behavioral therapy and Supportive therapy focusing on patient's anxiety and situational stressors. Clinician processed with the patient regarding how she has been since the last session. She processed with the patient regarding that the last time they met, she recalls that she was planning on trying to find another place. She asked the patient if she is planning on utilizing the housing resource list that she provided to her at the last session for assistance. She processed with the patient regarding her plan regarding finding another place and the  income bracket that she is looking for. She asked the patient would the eviction impact her ability to apply and get into another place. She processed with the patient that this not the news she was hoping for but she seems to be handling things well. She notes that regardless of how she is feeling that if she gets overwhelmed and starts to panic that it will make her job that much harder. She encouraged the patient to continue to focus on the positive, one goal at a time, and utilize relaxation techniques.   Return visit in 1 month.  With [name] LCSW.   Effectiveness: Established problem, stable/improving (1). Progressing It is felt more time is needed for Interventions to work.  Patient isfully orientated to time and place. Patient's Appropriate into problems. Active. Thought process is  Coherent.Minimal: No identifiable suicidal ideation.  Patients presenting with no risk factors but with morbid ruminations; may be classified as minimal risk based on the severity of the depressive symptoms and None.   Homework: Focus on one problem at a time. Utilize relaxation techniques and look into housing resources provided.   Plan: Follow up with [name], LCSW at [facility] in one month.

## 2017-04-13 ENCOUNTER — Ambulatory Visit: Payer: Self-pay

## 2017-04-13 ENCOUNTER — Ambulatory Visit: Payer: Self-pay | Admitting: Licensed Clinical Social Worker

## 2017-04-28 ENCOUNTER — Telehealth: Payer: Self-pay | Admitting: Licensed Clinical Social Worker

## 2017-04-28 NOTE — Telephone Encounter (Signed)
Clinician attempted to contact the patient to reschedule her last appointment during the inclement weather but the number is not in service.

## 2017-05-02 IMAGING — CR DG CHEST 2V
2 series · 2 of 2 positions shown · non-contrast
Comparison: May 03, 2016

CLINICAL DATA: Shortness of breath.

EXAM:
CHEST  2 VIEW

[chest pa]
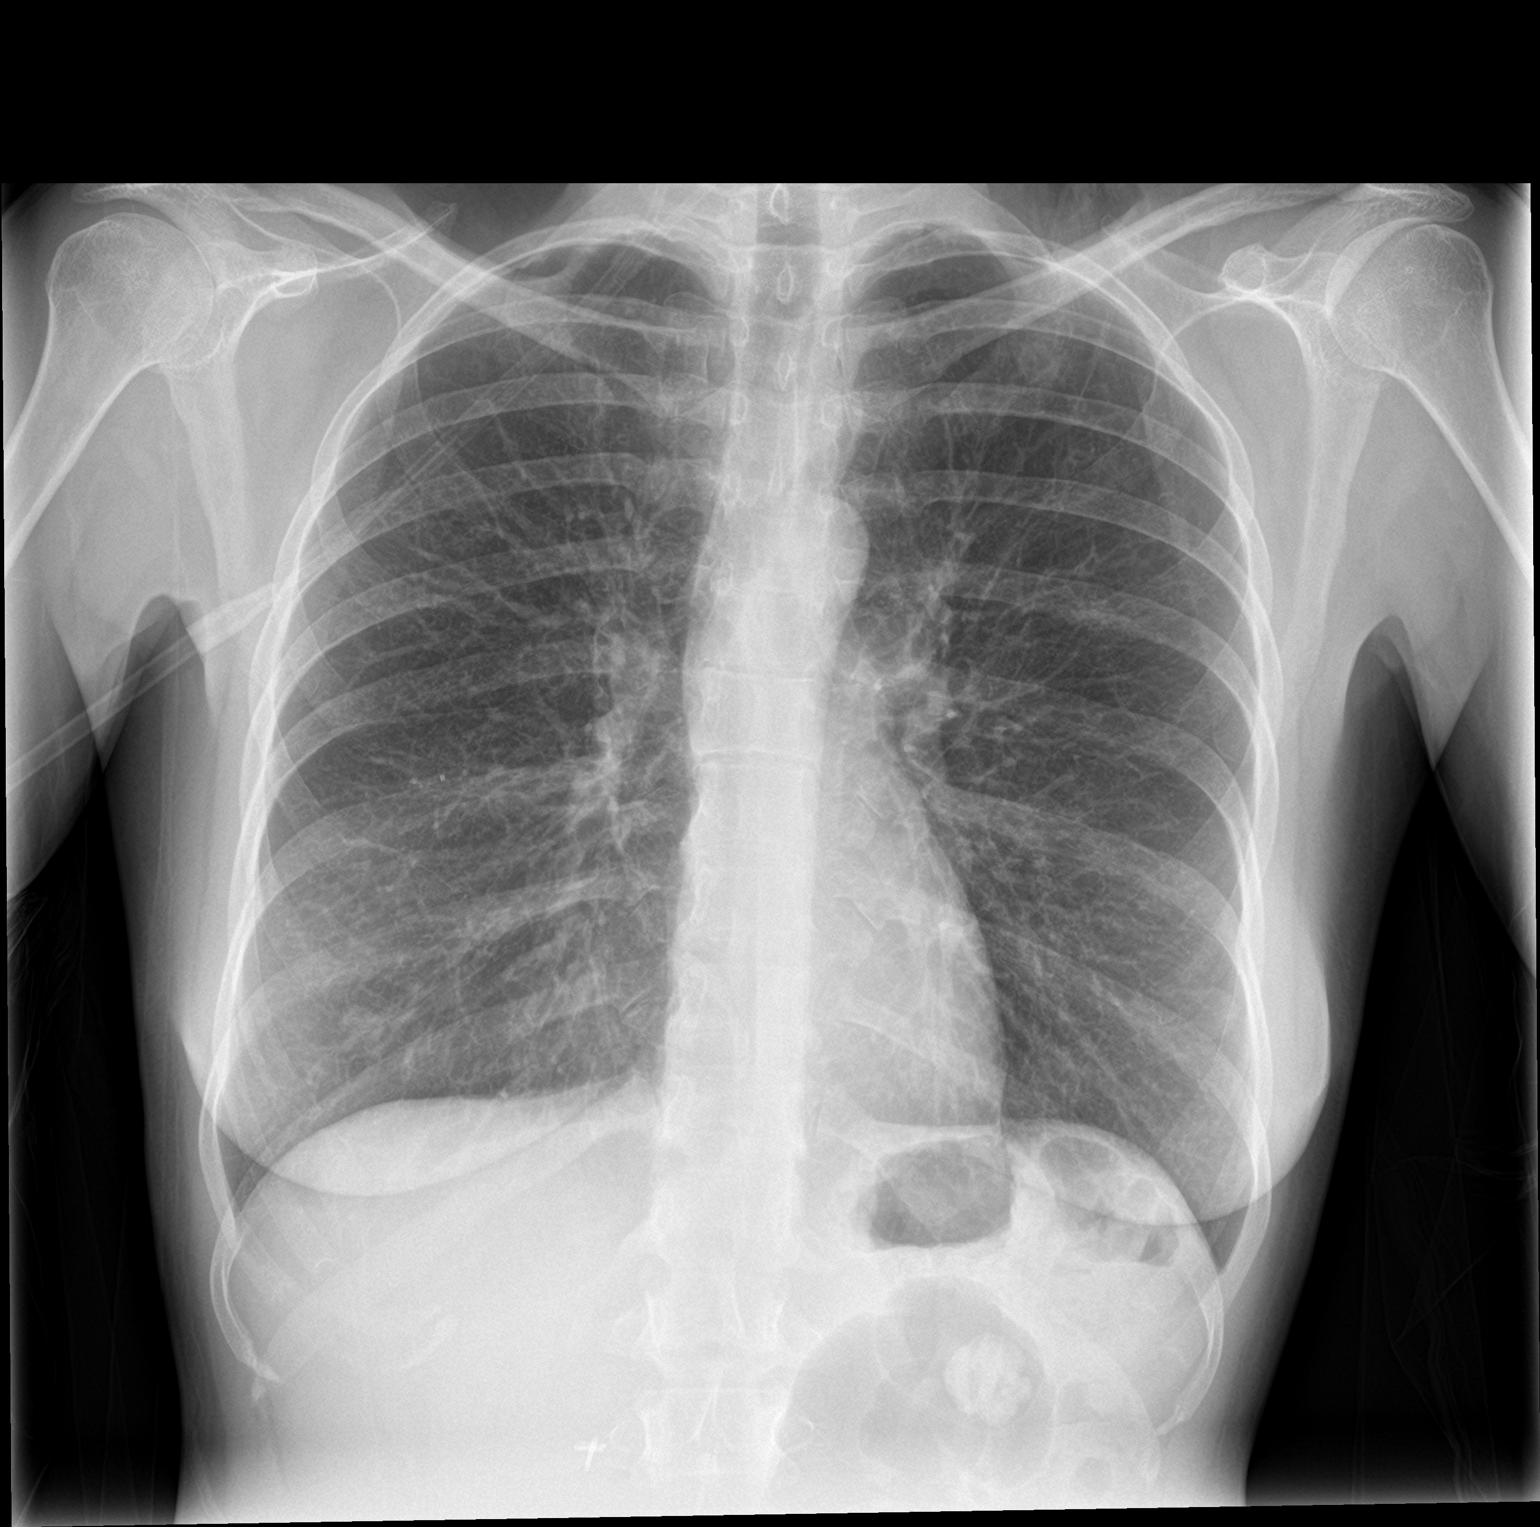

[chest lat]
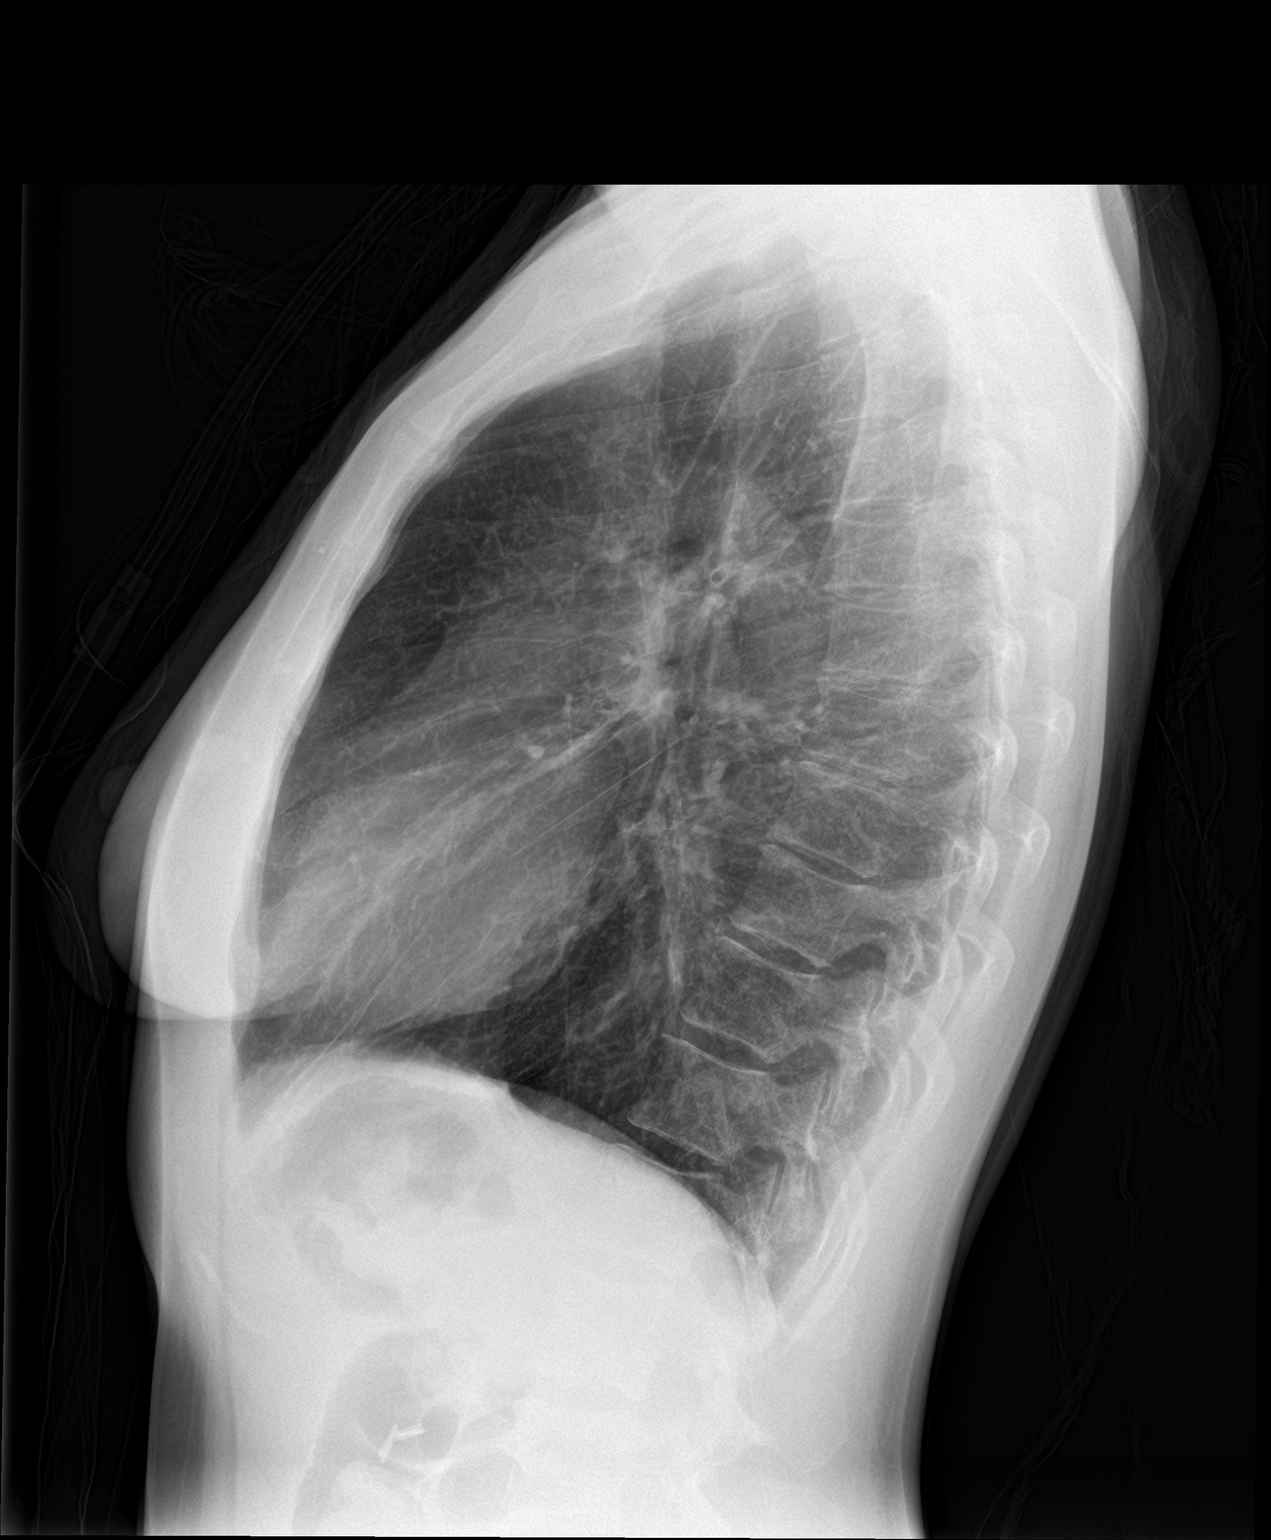

[2 of 2 positions shown; findings below may reference images not displayed]

FINDINGS: Bilateral nipple shadows. An ill defined nodular density is
projected over the left apex but may be artifactual as it was not
seen on the April 28, 2016 or May 03, 2016 comparisons. The
cardiomediastinal silhouette is stable. No focal infiltrates or
other acute abnormalities identified.
IMPRESSION: 1. Ill defined nodular density projected over the left apex may be
artifactual as it was not seen on recent comparisons. This could be
something on the patient. Recommend short-term follow-up to ensure
resolution. No other acute abnormalities.

## 2017-05-03 ENCOUNTER — Telehealth: Payer: Self-pay | Admitting: Pharmacist

## 2017-05-03 NOTE — Telephone Encounter (Signed)
05/03/17 Placed refill online with [facility] for Ventolin HFA 90mcg & Advair 250/50 to ship 05/14/17, order# [id number].Forde RadonAJ

## 2017-05-26 ENCOUNTER — Other Ambulatory Visit: Payer: Self-pay

## 2017-05-26 ENCOUNTER — Ambulatory Visit
Admission: RE | Admit: 2017-05-26 | Discharge: 2017-05-26 | Disposition: A | Discharge status: Home / Self Care | Payer: Self-pay | Source: Ambulatory Visit | Attending: Oncology | Admitting: Oncology

## 2017-05-26 ENCOUNTER — Ambulatory Visit: Payer: Self-pay | Attending: Oncology | Admitting: *Deleted

## 2017-05-26 VITALS — BP 95/64 | HR 65 | Temp 98.0°F | Ht 64.0 in | Wt 128.0 lb

## 2017-05-26 DIAGNOSIS — R920 Mammographic microcalcification found on diagnostic imaging of breast: Secondary | ICD-10-CM

## 2017-05-26 DIAGNOSIS — Z0000 Encounter for general adult medical examination without abnormal findings: Secondary | ICD-10-CM

## 2017-05-26 NOTE — Patient Instructions (Signed)
HPV Test The human papillomavirus (HPV) test is used to look for high-risk types of HPV infection. HPV is a group of about 100 viruses. Many of these viruses cause growths on, in, or around the genitals. Most HPV viruses cause infections that usually go away without treatment. However, HPV types 6, 11, 16, and 18 are considered high-risk types of HPV that can increase your risk of cancer of the cervix or anus if the infection is left untreated. An HPV test identifies the DNA (genetic) strands of the HPV infection, so it is also referred to as the HPV DNA test. Although HPV is found in both males and females, the HPV test is only used to screen for increased cancer risk in females:  With an abnormal Pap test.  After treatment of an abnormal Pap test.  Between the ages of 30 and 65.  After treatment of a high-risk HPV infection.  The HPV test may be done at the same time as a pelvic exam and Pap test in females over the age of 30. Both the HPV test and Pap test require a sample of cells from the cervix. How do I prepare for this test?  Do not douche or take a bath for 24-48 hours before the test or as directed by your health care provider.  Do not have sex for 24-48 hours before the test or as directed by your health care provider.  You may be asked to reschedule the test if you are menstruating.  You will be asked to urinate before the test. What do the results mean? It is your responsibility to obtain your test results. Ask the lab or department performing the test when and how you will get your results. Talk with your health care provider if you have any questions about your results. Your result will be negative or positive. Meaning of Negative Test Results A negative HPV test result means that no HPV was found, and it is very likely that you do not have HPV. Meaning of Positive Test Results A positive HPV test result indicates that you have HPV.  If your test result shows the presence  of any high-risk HPV strains, you may have an increased risk of developing cancer of the cervix or anus if the infection is left untreated.  If any low-risk HPV strains are found, you are not likely to have an increased risk of cancer.  Discuss your test results with your health care provider. He or she will use the results to make a diagnosis and determine a treatment plan that is right for you. Talk with your health care provider to discuss your results, treatment options, and if necessary, the need for more tests. Talk with your health care provider if you have any questions about your results. This information is not intended to replace advice given to you by your health care provider. Make sure you discuss any questions you have with your health care provider. Document Released: 04/17/2004 Document Revised: 11/27/2015 Document Reviewed: 08/08/2013 Elsevier Interactive Patient Education  2018 Elsevier Inc.   Gave patient hand-out, Women Staying Healthy, Active and Well from [facility], with education on breast health, pap smears, heart and colon health.  

## 2017-05-26 NOTE — Progress Notes (Signed)
Subjective:     Patient ID: Nicholos JohnsValerie Anschutz, female   DOB: 16-Apr-1970, 47 y.o.   MRN: [id number]  HPI   Review of Systems     Objective:   Physical Exam  Pulmonary/Chest: Right breast exhibits no inverted nipple, no mass, no nipple discharge, no skin change and no tenderness. Left breast exhibits no inverted nipple, no mass, no nipple discharge, no skin change and no tenderness. Breasts are symmetrical.    Abdominal: There is no splenomegaly or hepatomegaly.    Genitourinary: Rectal exam shows no external hemorrhoid. No labial fusion. There is no rash, tenderness, lesion or injury on the right labia. There is no rash, tenderness, lesion or injury on the left labia. Cervix exhibits no motion tenderness, no discharge and no friability. Right adnexum displays no mass, no tenderness and no fullness. Left adnexum displays no mass, no tenderness and no fullness. No erythema, tenderness or bleeding in the vagina. No foreign body in the vagina. No signs of injury around the vagina. Vaginal discharge found.    Genitourinary Comments: White non-odorous discharge noted       Assessment:     47 year [facility] White [facility]  Last mammo was a birads 3 for calcs.  Clinical breast exam with scattered fibroglandular like tissue.  Last pap unknown by the patient.  She [facility] was [facility], but they state she only had STD testing.  Specimen collected for pap smear.  Patient has been screened for eligibility.  She does not have any insurance, Medicare or Medicaid.  She also meets financial eligibility.  Hand-out given on the Affordable Care Act.    Plan:     Bilateral diagnostic mammogram and ultrasound ordered for calcs.  Specimen for pap sent to the lab.  Will follow-up per [facility] protocol.

## 2017-06-01 ENCOUNTER — Encounter: Payer: Self-pay | Admitting: *Deleted

## 2017-06-01 LAB — PAP LB AND HPV HIGH-RISK
HPV, HIGH-RISK: NEGATIVE
PAP Smear Comment: 0

## 2017-06-01 NOTE — Progress Notes (Signed)
Called patient to inform her of her pap results and need to treat the trichomonas.  Prescription for Metronidazole 2 grams by mouth once called in to [facility] on [address] per protocol.  Patient encouraged not to drink alcohol with this medication.  She has her breast biopsy tomorrow.  Will follow-up per [facility] protocol.

## 2017-06-02 ENCOUNTER — Ambulatory Visit
Admission: RE | Admit: 2017-06-02 | Discharge: 2017-06-02 | Disposition: A | Discharge status: Home / Self Care | Payer: Self-pay | Source: Ambulatory Visit | Attending: Oncology | Admitting: Oncology

## 2017-06-02 DIAGNOSIS — R920 Mammographic microcalcification found on diagnostic imaging of breast: Secondary | ICD-10-CM

## 2017-06-02 HISTORY — PX: BREAST BIOPSY: SHX20

## 2017-06-03 LAB — SURGICAL PATHOLOGY

## 2017-06-08 ENCOUNTER — Encounter: Payer: Self-pay | Admitting: *Deleted

## 2017-06-08 NOTE — Progress Notes (Signed)
Letter mailed to inform patient of her normal pap.  Patient was informed by radiology of her benign breast biopsy.  She is to follow up in one year with her mammogram and next pap in 5 years.  [facility] to [place].

## 2017-06-11 ENCOUNTER — Telehealth: Payer: Self-pay | Admitting: Pharmacy Technician

## 2017-06-11 NOTE — Telephone Encounter (Signed)
Patient failed to provide 2019 financial documentation.  No additional medication assistance will be provided by [facility] without the required proof of income documentation.  Patient notified by letter.  Betty J. Kluttz Care Manager [facility] 

## 2017-06-14 IMAGING — CR DG CHEST 2V
2 series · 2 of 2 positions shown · non-contrast
Comparison: Prior radiograph from 07/28/2016.

CLINICAL DATA: Initial evaluation for acute shortness of breath.

EXAM:
CHEST  2 VIEW

[chest pa]
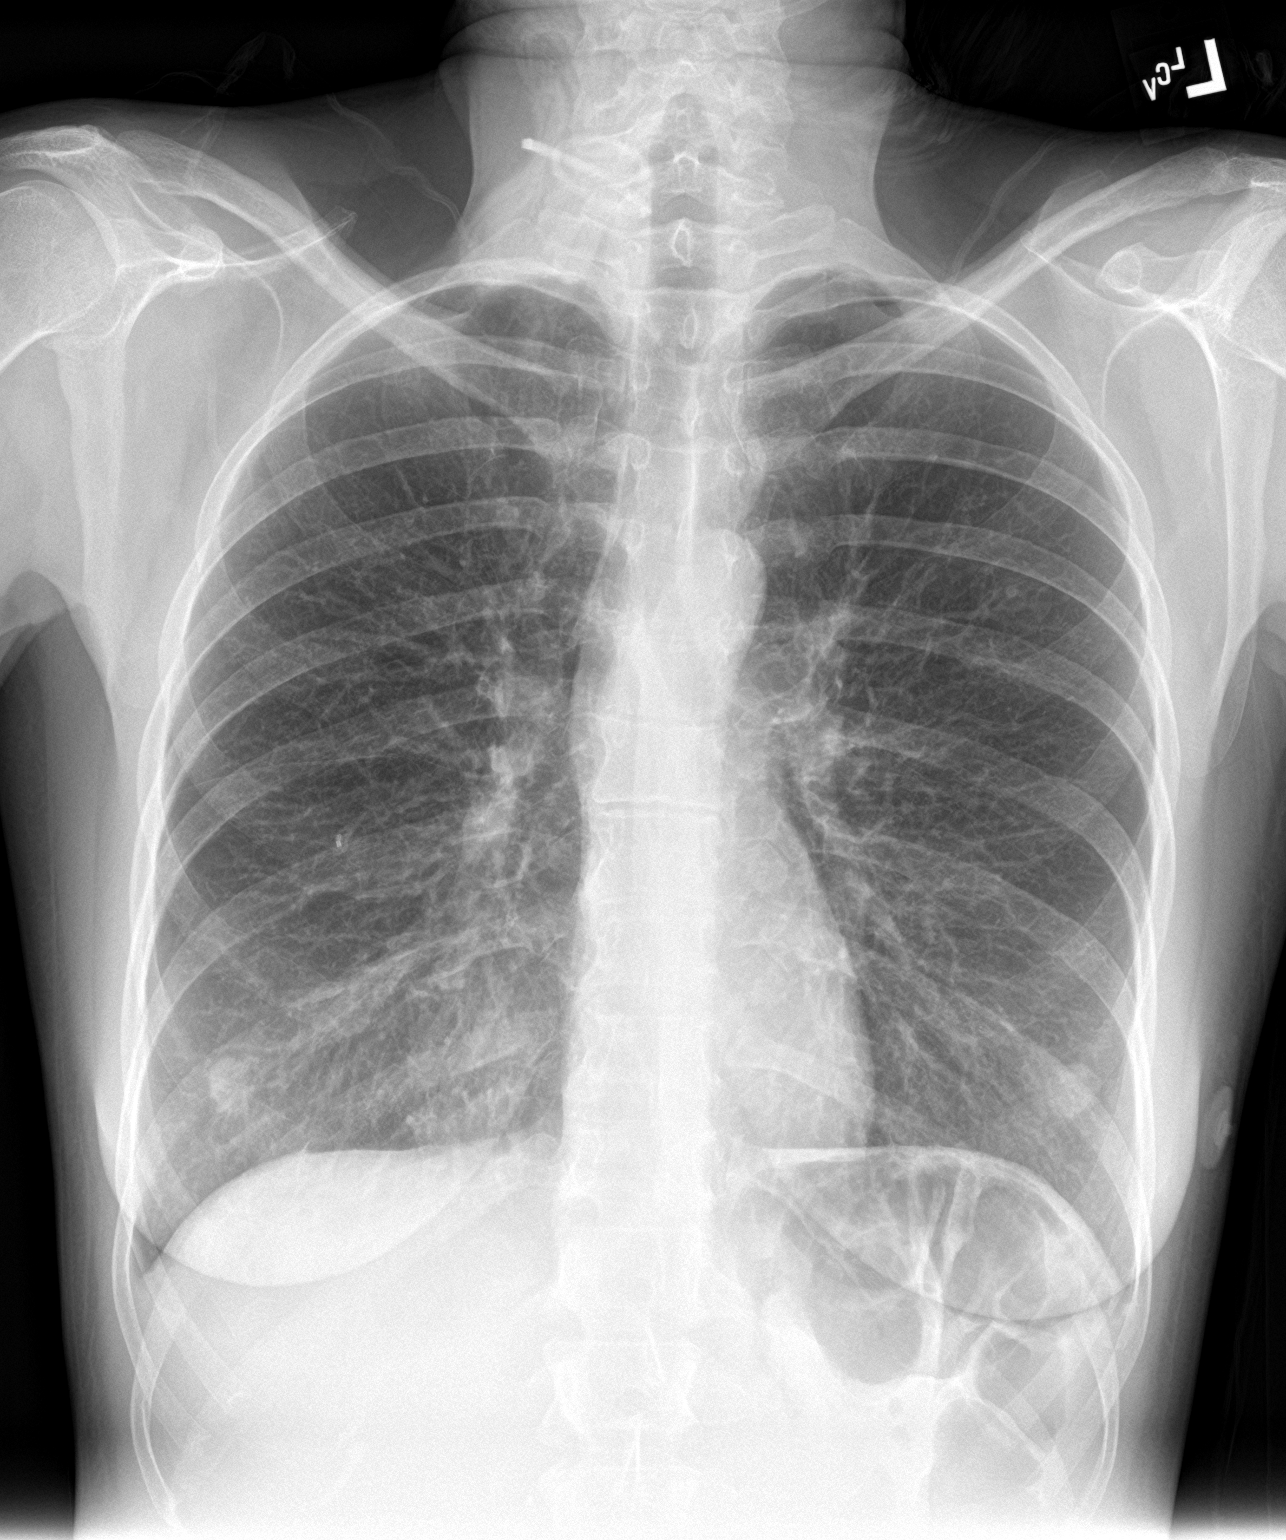

[chest lat]
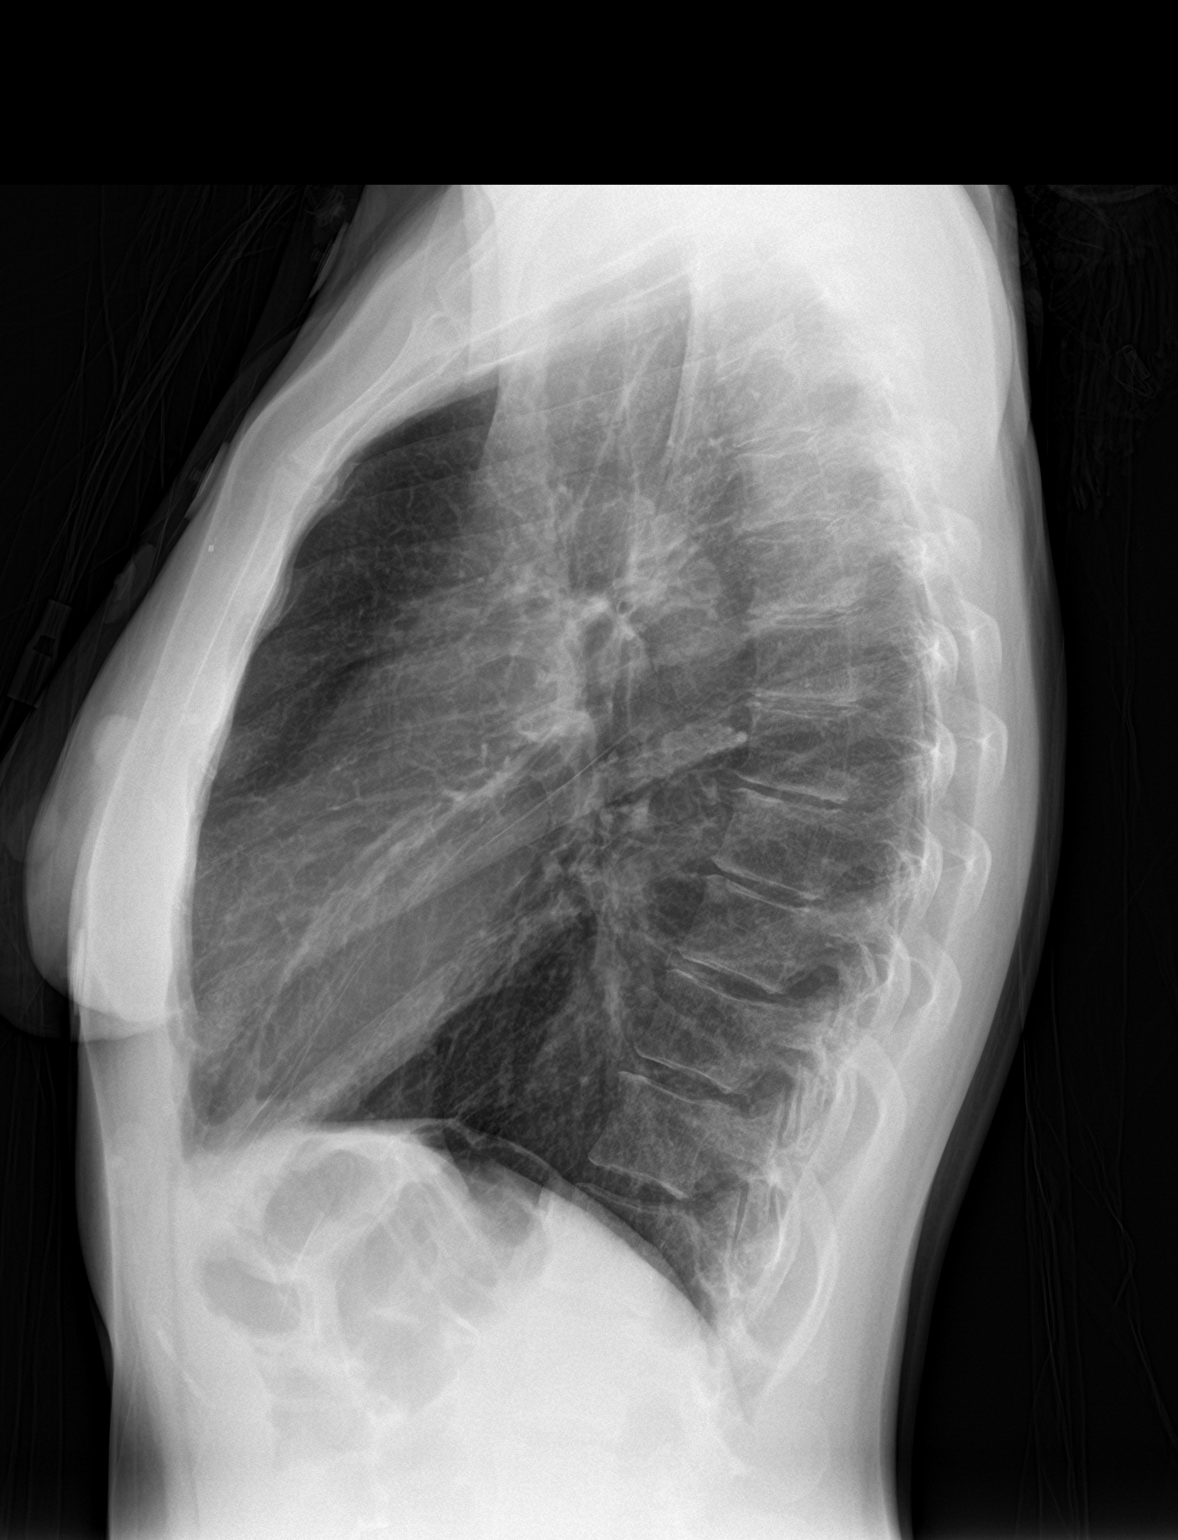

[2 of 2 positions shown; findings below may reference images not displayed]

FINDINGS: The cardiac and mediastinal silhouettes are stable in size and
contour, and remain within normal limits.

The lungs are normally inflated. No airspace consolidation, pleural
effusion, or pulmonary edema is identified. There is no
pneumothorax.

No acute osseous abnormality identified.
IMPRESSION: No radiographic evidence for active cardiopulmonary disease.

## 2017-09-15 ENCOUNTER — Encounter: Payer: Self-pay | Admitting: Emergency Medicine

## 2017-09-15 ENCOUNTER — Emergency Department
Admission: EM | Admit: 2017-09-15 | Discharge: 2017-09-15 | Disposition: A | Discharge status: Home / Self Care | Payer: Self-pay | Attending: Emergency Medicine | Admitting: Emergency Medicine

## 2017-09-15 ENCOUNTER — Other Ambulatory Visit: Payer: Self-pay

## 2017-09-15 ENCOUNTER — Emergency Department: Payer: Self-pay

## 2017-09-15 DIAGNOSIS — J449 Chronic obstructive pulmonary disease, unspecified: Secondary | ICD-10-CM | POA: Insufficient documentation

## 2017-09-15 DIAGNOSIS — J209 Acute bronchitis, unspecified: Secondary | ICD-10-CM | POA: Insufficient documentation

## 2017-09-15 DIAGNOSIS — F17200 Nicotine dependence, unspecified, uncomplicated: Secondary | ICD-10-CM | POA: Insufficient documentation

## 2017-09-15 DIAGNOSIS — Z79899 Other long term (current) drug therapy: Secondary | ICD-10-CM | POA: Insufficient documentation

## 2017-09-15 LAB — CBC WITH DIFFERENTIAL/PLATELET
Basophils Absolute: 0.1 10*3/uL (ref 0–0.1)
Basophils Relative: 1 %
EOS ABS: 0.7 10*3/uL (ref 0–0.7)
EOS PCT: 4 %
HCT: 43.1 % (ref 35.0–47.0)
Hemoglobin: 14.9 g/dL (ref 12.0–16.0)
LYMPHS ABS: 5.1 10*3/uL — AB (ref 1.0–3.6)
LYMPHS PCT: 30 %
MCH: 32.3 pg (ref 26.0–34.0)
MCHC: 34.4 g/dL (ref 32.0–36.0)
MCV: 93.7 fL (ref 80.0–100.0)
MONOS PCT: 9 %
Monocytes Absolute: 1.5 10*3/uL — ABNORMAL HIGH (ref 0.2–0.9)
Neutro Abs: 10 10*3/uL — ABNORMAL HIGH (ref 1.4–6.5)
Neutrophils Relative %: 56 %
PLATELETS: 221 10*3/uL (ref 150–440)
RBC: 4.6 MIL/uL (ref 3.80–5.20)
RDW: 13.9 % (ref 11.5–14.5)
WBC: 17.4 10*3/uL — AB (ref 3.6–11.0)

## 2017-09-15 LAB — BASIC METABOLIC PANEL
Anion gap: 9 (ref 5–15)
BUN: 12 mg/dL (ref 6–20)
CALCIUM: 9.4 mg/dL (ref 8.9–10.3)
CHLORIDE: 106 mmol/L (ref 101–111)
CO2: 23 mmol/L (ref 22–32)
CREATININE: 0.71 mg/dL (ref 0.44–1.00)
GFR calc non Af Amer: >60 mL/min (ref >60)
Glucose, Bld: 92 mg/dL (ref 65–99)
Potassium: 3.5 mmol/L (ref 3.5–5.1)
Sodium: 138 mmol/L (ref 135–145)

## 2017-09-15 LAB — TROPONIN I: Troponin I: <0.03 ng/mL (ref <0.03)

## 2017-09-15 LAB — FIBRIN DERIVATIVES D-DIMER (ARMC ONLY): FIBRIN DERIVATIVES D-DIMER (ARMC): 368.99 ng{FEU}/mL (ref 0.00–499.00)

## 2017-09-15 MED ORDER — PREDNISONE 20 MG PO TABS: 20.0000 mg | ORAL_TABLET | Freq: Every day | ORAL | 0 refills | Status: DC

## 2017-09-15 MED ORDER — BENZONATATE 100 MG PO CAPS
200.0000 mg | ORAL_CAPSULE | Freq: Once | ORAL | Status: AC
  Administered 2017-09-15: 200 mg via ORAL
  Filled 2017-09-15: qty 2

## 2017-09-15 MED ORDER — PREDNISONE 20 MG PO TABS
60.0000 mg | ORAL_TABLET | Freq: Once | ORAL | Status: AC
  Administered 2017-09-15: 60 mg via ORAL
  Filled 2017-09-15: qty 3

## 2017-09-15 MED ORDER — IPRATROPIUM-ALBUTEROL 0.5-2.5 (3) MG/3ML IN SOLN
RESPIRATORY_TRACT | Status: AC
  Administered 2017-09-15: 3 mL via RESPIRATORY_TRACT
  Filled 2017-09-15: qty 3

## 2017-09-15 MED ORDER — IPRATROPIUM-ALBUTEROL 0.5-2.5 (3) MG/3ML IN SOLN
3.0000 mL | Freq: Once | RESPIRATORY_TRACT | Status: AC
  Administered 2017-09-15: 3 mL via RESPIRATORY_TRACT

## 2017-09-15 MED ORDER — AZITHROMYCIN 250 MG PO TABS: ORAL_TABLET | ORAL | 0 refills | Status: AC

## 2017-09-15 MED ORDER — IPRATROPIUM-ALBUTEROL 0.5-2.5 (3) MG/3ML IN SOLN
3.0000 mL | Freq: Once | RESPIRATORY_TRACT | Status: AC
  Administered 2017-09-15: 3 mL via RESPIRATORY_TRACT
  Filled 2017-09-15: qty 3

## 2017-09-15 MED ORDER — BENZONATATE 100 MG PO CAPS: 100.0000 mg | ORAL_CAPSULE | Freq: Four times a day (QID) | ORAL | 0 refills | Status: DC | PRN

## 2017-09-15 MED ORDER — FLUTICASONE-SALMETEROL 250-50 MCG/DOSE IN AEPB: 1.0000 | INHALATION_SPRAY | Freq: Two times a day (BID) | RESPIRATORY_TRACT | 1 refills | Status: DC

## 2017-09-15 NOTE — ED Notes (Signed)
Patient transported to X-ray 

## 2017-09-15 NOTE — ED Provider Notes (Signed)
[facility] Emergency Department Provider Note   ____________________________________________   First MD Initiated Contact with Patient 09/15/17 2038     (approximate)  I have reviewed the triage vital signs and the nursing notes.   HISTORY  Chief Complaint Cough    HPI Melissa Dean is a 47 y.o. female with a dry cough that began yesterday.  She says she ran out of her Advair 2 days ago and just cannot afford it.  Coughing makes her low back hurt.  She is not particularly short of breath right now.  She is on oxygen.  By the time I see her she has had to DuoNeb's and she is no longer wheezing.  She still has a dry cough.  Chest x-ray is essentially negative white count is somewhat elevated d-dimer is negative   Past Medical History:  Diagnosis Date  . Anemia   . Anxiety   . Asthma   . Bronchitis   . COPD (chronic obstructive pulmonary disease) (HCC)   . Depression     Patient Active Problem List   Diagnosis Date Noted  . Housing or economic circumstance 02/10/2017  . Acute respiratory failure with hypoxia (HCC) 07/04/2016  . COPD (chronic obstructive pulmonary disease) (HCC) 07/04/2016  . Acute bronchitis 07/04/2016  . Anxiety 07/18/2014  . Tobacco abuse 07/18/2014  . Abnormal TSH 07/18/2014  . Atypical chest pain 07/18/2014  . Chest pain 07/06/2014    Past Surgical History:  Procedure Laterality Date  . BREAST BIOPSY Right 05/24/2016   coil marker. PSEUDO-ANGIOMATOUS STROMAL HYPERPLASIA. NEGATIVE FOR ATYPIA  . BREAST BIOPSY Right 06/02/2017   ribbon marker. path pending  . CESAREAN SECTION    . CESAREAN SECTION    . CHOLECYSTECTOMY    . TONSILLECTOMY    . TUBAL LIGATION      Prior to Admission medications   Medication Sig Start Date End Date Taking? Authorizing Provider  albuterol (PROVENTIL HFA;VENTOLIN HFA) 108 (90 Base) MCG/ACT inhaler Inhale 2-4 puffs by mouth every 4 hours as needed for wheezing, cough, and/or shortness of  breath 08/04/16   McGowan, Carollee HerterShannon A, PA-C  azithromycin (ZITHROMAX Z-PAK) 250 MG tablet Take 2 tablets (500 mg) on  Day 1,  followed by 1 tablet (250 mg) once daily on Days 2 through 5. 09/15/17 09/20/17  Arnaldo NatalMalinda, Haru Anspaugh F, MD  benzonatate (TESSALON PERLES) 100 MG capsule Take 1 capsule (100 mg total) by mouth every 6 (six) hours as needed for cough. 09/15/17 09/15/18  Arnaldo NatalMalinda, Hillis Mcphatter F, MD  cetirizine (ZYRTEC) 10 MG tablet TAKE ONE TABLET BY MOUTH AT BEDTIME 11/17/16   Virl Axehaplin, Don C, MD  ciprofloxacin (CIPRO) 500 MG tablet Take 1 tablet (500 mg total) by mouth 2 (two) times daily. 01/11/17   Jene EveryKinner, Robert, MD  Fluticasone-Salmeterol (ADVAIR) 250-50 MCG/DOSE AEPB Inhale 1 puff into the lungs 2 (two) times daily. 08/20/16   Doles-Johnson, Teah, NP  predniSONE (DELTASONE) 20 MG tablet Take 1 tablet (20 mg total) by mouth daily. 09/15/17 09/15/18  Arnaldo NatalMalinda, Selina Tapper F, MD  traMADol (ULTRAM) 50 MG tablet Take 1 tablet (50 mg total) by mouth every 6 (six) hours as needed. 01/11/17 01/11/18  Jene EveryKinner, Robert, MD    Allergies Patient has no known allergies.  Family History  Problem Relation Age of Onset  . Heart disease Father   . Hypertension Father   . Diabetes Father   . Cancer Father        lung cancer  . Diabetes Brother   .  Hypertension Paternal Grandfather   . Diabetes Paternal Grandfather   . Thyroid disease Unknown   . Breast cancer Neg Hx     Social History Social History   Tobacco Use  . Smoking status: Current Some Day Smoker    Packs/day: 0.25    Years: 20.00    Pack years: 5.00  . Smokeless tobacco: Never Used  . Tobacco comment: says she still smokes 1-2 every once in awhile  Substance Use Topics  . Alcohol use: No    Alcohol/week: 0.0 oz  . Drug use: No    Review of Systems  Constitutional: No fever/chills Eyes: No visual changes. ENT: No sore throat. Cardiovascular: Denies chest pain. Respiratory: Denies shortness of breath. Gastrointestinal: No abdominal pain.  No nausea, no  vomiting.  No diarrhea.  No constipation. Genitourinary: Negative for dysuria. Musculoskeletal: Negative for back pain. Skin: Negative for rash. Neurological: Negative for headaches, focal weakness   ____________________________________________   PHYSICAL EXAM:  VITAL SIGNS: ED Triage Vitals  Enc Vitals Group     BP 09/15/17 2005 (!) 122/99     Pulse Rate 09/15/17 2005 (!) 105     Resp 09/15/17 2005 (!) 28     Temp 09/15/17 2005 98 F (36.7 C)     Temp Source 09/15/17 2005 Oral     SpO2 09/15/17 2005 90 %     Weight 09/15/17 2004 125 lb (56.7 kg)     Height 09/15/17 2004 5\' 2"  (1.575 m)     Head Circumference --      Peak Flow --      Pain Score 09/15/17 2006 0     Pain Loc --      Pain Edu? --      Excl. in GC? --     Constitutional: Alert and oriented. Well appearing and in no acute distress. Eyes: Conjunctivae are normal Head: Atraumatic. Nose: No congestion/rhinnorhea. Mouth/Throat: Mucous membranes are moist.  Oropharynx non-erythematous. Neck: No stridor.  Cardiovascular: Normal rate, regular rhythm. Grossly normal heart sounds.  Good peripheral circulation. Respiratory: Normal respiratory effort.  No retractions. Lungs CTAB. Gastrointestinal: Soft and nontender. No distention. No abdominal bruits. No CVA tenderness. Musculoskeletal: No lower extremity tenderness nor edema.  No joint effusions. Neurologic:  Normal speech and language. No gross focal neurologic deficits are appreciated.. Skin:  Skin is warm, dry and intact. No rash noted. Psychiatric: Mood and affect are normal. Speech and behavior are normal.  ____________________________________________   LABS (all labs ordered are listed, but only abnormal results are displayed)  Labs Reviewed  CBC WITH DIFFERENTIAL/PLATELET - Abnormal; Notable for the following components:      Result Value   WBC 17.4 (*)    Neutro Abs 10.0 (*)    Lymphs Abs 5.1 (*)    Monocytes Absolute 1.5 (*)    All other  components within normal limits  BASIC METABOLIC PANEL  TROPONIN I  FIBRIN DERIVATIVES D-DIMER ([facility] ONLY)   ____________________________________________  EKG   EKG read and interpreted by me shows normal sinus rhythm rate of 99 normal axis nonspecific ST-T wave changes _____________________________________  RADIOLOGY  ED MD interpretation chest x-ray read by radiology and reviewed by me does not show any acute changes.  Official radiology report(s): Dg Chest 2 View  Result Date: 09/15/2017 CLINICAL DATA:  Hyperventilation. EXAM: CHEST - 2 VIEW COMPARISON:  Aug 16, 2016 FINDINGS: Mild bronchitic changes in the bases. No suspicious infiltrates, nodules, or masses. No pneumothorax. The cardiomediastinal silhouette is stable.  IMPRESSION: Mild bronchitic changes in the bases. Electronically Signed   By: Gerome Sam III M.D   On: 09/15/2017 20:51    ____________________________________________   PROCEDURES  Procedure(s) performed:   Procedures  Critical Care performed:   ____________________________________________   INITIAL IMPRESSION / ASSESSMENT AND PLAN / ED COURSE  Patient studies are negative.  She does have a frequent dry cough no further wheezing.  I will give her some Tessalon Perles prednisone since she cannot afford the Advair and some antibiotics.  She says she does have inhalers at home does not the Advair.      ____________________________________________   FINAL CLINICAL IMPRESSION(S) / ED DIAGNOSES  Final diagnoses:  Acute bronchitis, unspecified organism     ED Discharge Orders        Ordered    predniSONE (DELTASONE) 20 MG tablet  Daily     09/15/17 2229    azithromycin (ZITHROMAX Z-PAK) 250 MG tablet     09/15/17 2229    benzonatate (TESSALON PERLES) 100 MG capsule  Every 6 hours PRN     09/15/17 2229       Note:  This document was prepared using Dragon voice recognition software and may include unintentional dictation errors.      Arnaldo Natal, MD 09/15/17 2239

## 2017-09-15 NOTE — ED Triage Notes (Addendum)
Pt ambulatory to triage, very anxious, intermittently hyperventilating then holding breath and frequent dry cough noted; Pt reports having prod cough clear sputum since yesterday with mid & lower back pain; increased today; denies any fever; taking tessalon, inhaler and mucinex without relief; hx COPD, +smoker

## 2017-09-15 NOTE — Discharge Instructions (Signed)
Continue to use your inhalers.  Take the Tessalon Perles 1 3 times a day as directed.  Make sure you swallow them do not suck on them.  Take the prednisone once a day you can start that tomorrow I gave you a dose of both Tessalon and the prednisone here.  Zithromax also start tomorrow.  Please return for any further problems follow-up with your primary care doctor later this week..Marland Kitchen

## 2017-09-15 NOTE — ED Notes (Signed)
Pt stating that she ran out of her Advair two days ago and that she started with the cough yesterday. Pt stating that she was unable to get her Advair because she could not afford it. Pt has dry cough and appears anxious. Nurse instructed the pt to deep breath and control breathing at this time. Pt doing well with the technique and was able to relax some at this time.

## 2017-10-14 ENCOUNTER — Telehealth: Payer: Self-pay | Admitting: Pharmacy Technician

## 2017-10-14 NOTE — Telephone Encounter (Signed)
Received 2018 Tax Return.  Patient eligible for medication assistance at [facility] through 2019, as long as eligibility criteria continues to be met.  [place] [facility]

## 2017-11-02 ENCOUNTER — Telehealth: Payer: Self-pay | Admitting: Pharmacist

## 2017-11-12 ENCOUNTER — Telehealth: Payer: Self-pay | Admitting: Pharmacist

## 2017-11-12 NOTE — Telephone Encounter (Signed)
11/12/2017 10:29:47 AM - Faxed to [employer] for renewal  11/12/17 Faxed [employer] renewal application with scripts for Advair 250/50 Inhale one puff into the lungs 2 times a day & Ventolin HFA 90mcg Inhale 2-4 puffs every 4 hours as needed for wheezing, cough or shortness of breath for Renewal.AJ

## 2017-11-24 ENCOUNTER — Other Ambulatory Visit: Payer: Self-pay | Admitting: Adult Health Nurse Practitioner

## 2017-12-03 ENCOUNTER — Other Ambulatory Visit: Payer: Self-pay | Admitting: Adult Health Nurse Practitioner

## 2017-12-08 ENCOUNTER — Other Ambulatory Visit: Payer: Self-pay | Admitting: Internal Medicine

## 2017-12-27 ENCOUNTER — Other Ambulatory Visit: Payer: Self-pay | Admitting: Internal Medicine

## 2018-01-03 ENCOUNTER — Emergency Department
Admission: EM | Admit: 2018-01-03 | Discharge: 2018-01-03 | Disposition: A | Discharge status: Home / Self Care | Payer: Self-pay | Attending: Emergency Medicine | Admitting: Emergency Medicine

## 2018-01-03 ENCOUNTER — Other Ambulatory Visit: Payer: Self-pay

## 2018-01-03 ENCOUNTER — Emergency Department: Payer: Self-pay

## 2018-01-03 DIAGNOSIS — R109 Unspecified abdominal pain: Secondary | ICD-10-CM | POA: Insufficient documentation

## 2018-01-03 DIAGNOSIS — Z87442 Personal history of urinary calculi: Secondary | ICD-10-CM | POA: Insufficient documentation

## 2018-01-03 DIAGNOSIS — Z87440 Personal history of urinary (tract) infections: Secondary | ICD-10-CM | POA: Insufficient documentation

## 2018-01-03 DIAGNOSIS — J449 Chronic obstructive pulmonary disease, unspecified: Secondary | ICD-10-CM | POA: Insufficient documentation

## 2018-01-03 DIAGNOSIS — R319 Hematuria, unspecified: Secondary | ICD-10-CM | POA: Insufficient documentation

## 2018-01-03 DIAGNOSIS — Z79899 Other long term (current) drug therapy: Secondary | ICD-10-CM | POA: Insufficient documentation

## 2018-01-03 DIAGNOSIS — F17210 Nicotine dependence, cigarettes, uncomplicated: Secondary | ICD-10-CM | POA: Insufficient documentation

## 2018-01-03 LAB — BASIC METABOLIC PANEL
Anion gap: 6 (ref 5–15)
BUN: 11 mg/dL (ref 6–20)
CO2: 25 mmol/L (ref 22–32)
CREATININE: 0.67 mg/dL (ref 0.44–1.00)
Calcium: 8.9 mg/dL (ref 8.9–10.3)
Chloride: 109 mmol/L (ref 98–111)
GFR calc Af Amer: >60 mL/min (ref >60)
Glucose, Bld: 84 mg/dL (ref 70–99)
Potassium: 3.6 mmol/L (ref 3.5–5.1)
SODIUM: 140 mmol/L (ref 135–145)

## 2018-01-03 LAB — URINALYSIS, COMPLETE (UACMP) WITH MICROSCOPIC
BILIRUBIN URINE: NEGATIVE
Bacteria, UA: NONE SEEN
GLUCOSE, UA: NEGATIVE mg/dL
Hgb urine dipstick: NEGATIVE
Ketones, ur: 5 mg/dL — AB
LEUKOCYTES UA: NEGATIVE
Nitrite: NEGATIVE
PH: 6 (ref 5.0–8.0)
Protein, ur: NEGATIVE mg/dL
Specific Gravity, Urine: 1.028 (ref 1.005–1.030)

## 2018-01-03 LAB — CBC
HCT: 38.7 % (ref 35.0–47.0)
Hemoglobin: 13.5 g/dL (ref 12.0–16.0)
MCH: 33.3 pg (ref 26.0–34.0)
MCHC: 35 g/dL (ref 32.0–36.0)
MCV: 95 fL (ref 80.0–100.0)
PLATELETS: 198 10*3/uL (ref 150–440)
RBC: 4.07 MIL/uL (ref 3.80–5.20)
RDW: 13.5 % (ref 11.5–14.5)
WBC: 9.5 10*3/uL (ref 3.6–11.0)

## 2018-01-03 LAB — POCT PREGNANCY, URINE: PREG TEST UR: NEGATIVE

## 2018-01-03 MED ORDER — PHENAZOPYRIDINE HCL 200 MG PO TABS: 200.0000 mg | ORAL_TABLET | Freq: Three times a day (TID) | ORAL | 0 refills | Status: DC | PRN

## 2018-01-03 MED ORDER — CEFTRIAXONE SODIUM 1 G IJ SOLR
INTRAMUSCULAR | Status: AC
  Filled 2018-01-03: qty 10

## 2018-01-03 MED ORDER — SODIUM CHLORIDE 0.9 % IV SOLN
1.0000 g | Freq: Once | INTRAVENOUS | Status: AC
  Administered 2018-01-03: 1 g via INTRAVENOUS

## 2018-01-03 MED ORDER — SODIUM CHLORIDE 0.9 % IV BOLUS
1000.0000 mL | Freq: Once | INTRAVENOUS | Status: AC
  Administered 2018-01-03: 1000 mL via INTRAVENOUS

## 2018-01-03 MED ORDER — KETOROLAC TROMETHAMINE 30 MG/ML IJ SOLN
30.0000 mg | Freq: Once | INTRAMUSCULAR | Status: AC
  Administered 2018-01-03: 30 mg via INTRAVENOUS
  Filled 2018-01-03: qty 1

## 2018-01-03 MED ORDER — CEPHALEXIN 500 MG PO CAPS: 500.0000 mg | ORAL_CAPSULE | Freq: Three times a day (TID) | ORAL | 0 refills | Status: DC

## 2018-01-03 NOTE — ED Notes (Signed)
POC urine-negative result. Urine sent to lab

## 2018-01-03 NOTE — ED Provider Notes (Signed)
[facility] Emergency Department Provider Note  ____________________________________________   I have reviewed the triage vital signs and the nursing notes.   HISTORY  Chief Complaint Flank Pain   History limited by: Not Limited   HPI Melissa Dean is a 47 y.o. female who presents to the emergency department today because of concerns for left flank pain.  Patient states that the pain started yesterday.  Is continued to get worse today.  Patient states the pain reminds her when she has had kidney infections in the past.  She states she is also had kidney stones in the past.  Patient denies any  vomiting.  She denies any dysuria bad odor to her urine.  She denies any fevers.    Per medical record review patient has a history of COPD  Past Medical History:  Diagnosis Date  . Anemia   . Anxiety   . Asthma   . Bronchitis   . COPD (chronic obstructive pulmonary disease) (HCC)   . Depression     Patient Active Problem List   Diagnosis Date Noted  . Housing or economic circumstance 02/10/2017  . Acute respiratory failure with hypoxia (HCC) 07/04/2016  . COPD (chronic obstructive pulmonary disease) (HCC) 07/04/2016  . Acute bronchitis 07/04/2016  . Anxiety 07/18/2014  . Tobacco abuse 07/18/2014  . Abnormal TSH 07/18/2014  . Atypical chest pain 07/18/2014  . Chest pain 07/06/2014    Past Surgical History:  Procedure Laterality Date  . BREAST BIOPSY Right 05/24/2016   coil marker. PSEUDO-ANGIOMATOUS STROMAL HYPERPLASIA. NEGATIVE FOR ATYPIA  . BREAST BIOPSY Right 06/02/2017   ribbon marker. path pending  . CESAREAN SECTION    . CESAREAN SECTION    . CHOLECYSTECTOMY    . TONSILLECTOMY    . TUBAL LIGATION      Prior to Admission medications   Medication Sig Start Date End Date Taking? Authorizing Provider  albuterol (PROVENTIL HFA;VENTOLIN HFA) 108 (90 Base) MCG/ACT inhaler Inhale 2-4 puffs by mouth every 4 hours as needed for wheezing, cough,  and/or shortness of breath 08/04/16   McGowan, Carollee Herter A, PA-C  benzonatate (TESSALON PERLES) 100 MG capsule Take 1 capsule (100 mg total) by mouth every 6 (six) hours as needed for cough. 09/15/17 09/15/18  Arnaldo Natal, MD  cetirizine (ZYRTEC) 10 MG tablet TAKE ONE TABLET BY MOUTH AT BEDTIME 11/17/16   Virl Axe, MD  ciprofloxacin (CIPRO) 500 MG tablet Take 1 tablet (500 mg total) by mouth 2 (two) times daily. 01/11/17   Jene Every, MD  Fluticasone-Salmeterol (ADVAIR DISKUS) 250-50 MCG/DOSE AEPB Inhale 1 puff into the lungs 2 (two) times daily. 09/15/17 09/15/18  Arnaldo Natal, MD  Fluticasone-Salmeterol (ADVAIR) 250-50 MCG/DOSE AEPB Inhale 1 puff into the lungs 2 (two) times daily. 08/20/16   Doles-Johnson, Teah, NP  predniSONE (DELTASONE) 20 MG tablet Take 1 tablet (20 mg total) by mouth daily. 09/15/17 09/15/18  Arnaldo Natal, MD  traMADol (ULTRAM) 50 MG tablet Take 1 tablet (50 mg total) by mouth every 6 (six) hours as needed. 01/11/17 01/11/18  Jene Every, MD    Allergies Patient has no known allergies.  Family History  Problem Relation Age of Onset  . Heart disease Father   . Hypertension Father   . Diabetes Father   . Cancer Father        lung cancer  . Diabetes Brother   . Hypertension Paternal Grandfather   . Diabetes Paternal Grandfather   . Thyroid disease Unknown   .  Breast cancer Neg Hx     Social History Social History   Tobacco Use  . Smoking status: Current Some Day Smoker    Packs/day: 0.25    Years: 20.00    Pack years: 5.00  . Smokeless tobacco: Never Used  . Tobacco comment: says she still smokes 1-2 every once in awhile  Substance Use Topics  . Alcohol use: No    Alcohol/week: 0.0 standard drinks  . Drug use: No    Review of Systems Constitutional: No fever/chills Eyes: No visual changes. ENT: No sore throat. Cardiovascular: Denies chest pain. Respiratory: Denies shortness of breath. Gastrointestinal: Positive for left flank pain.  Positive for nausea.  Genitourinary: Negative for dysuria. Musculoskeletal: Negative for back pain. Skin: Negative for rash. Neurological: Negative for headaches, focal weakness or numbness.  ____________________________________________   PHYSICAL EXAM:  VITAL SIGNS: ED Triage Vitals  Enc Vitals Group     BP 01/03/18 2020 (!) 96/50     Pulse Rate 01/03/18 2020 78     Resp 01/03/18 2020 19     Temp 01/03/18 2020 98.3 F (36.8 C)     Temp Source 01/03/18 2020 Oral     SpO2 01/03/18 2020 99 %     Weight 01/03/18 2018 125 lb (56.7 kg)     Height 01/03/18 2018 5\' 2"  (1.575 m)     Head Circumference --      Peak Flow --      Pain Score 01/03/18 2018 8   Constitutional: Alert and oriented.  Eyes: Conjunctivae are normal.  ENT      Head: Normocephalic and atraumatic.      Nose: No congestion/rhinnorhea.      Mouth/Throat: Mucous membranes are moist.      Neck: No stridor. Hematological/Lymphatic/Immunilogical: No cervical lymphadenopathy. Cardiovascular: Normal rate, regular rhythm.  No murmurs, rubs, or gallops.  Respiratory: Normal respiratory effort without tachypnea nor retractions. Breath sounds are clear and equal bilaterally. No wheezes/rales/rhonchi. Gastrointestinal: Soft and non tender. No rebound. No guarding.  Genitourinary: Deferred Musculoskeletal: Normal range of motion in all extremities. No lower extremity edema. Neurologic:  Normal speech and language. No gross focal neurologic deficits are appreciated.  Skin:  Skin is warm, dry and intact. No rash noted. Psychiatric: Mood and affect are normal. Speech and behavior are normal. Patient exhibits appropriate insight and judgment.  ____________________________________________    LABS (pertinent positives/negatives)  CBC wnl BMP wnl Upreg negative UA 11-20 RBC  ____________________________________________   EKG None  ____________________________________________    RADIOLOGY  CT renal No acute  findings  ____________________________________________   PROCEDURES  Procedures  ____________________________________________   INITIAL IMPRESSION / ASSESSMENT AND PLAN / ED COURSE  Pertinent labs & imaging results that were available during my care of the patient were reviewed by me and considered in my medical decision making (see chart for details).   Presented to the emergency department today because of concerns for left flank pain.  She states it does remind her when she has had urinary tract infections in the past.  Patient had some blood in the urine but no white counts.  While this could be secondary to infection CT renal was performed to evaluate for kidney stone.  CT renal did not show kidney stone or any other obvious etiology of the patient's pain.  Given the patient states she has had a year TIAs and kidney infections the past feels similar will plan on treating for possible urinary tract infection.  Will give dose of  antibiotics here and discharged with further antibiotics.   ____________________________________________   FINAL CLINICAL IMPRESSION(S) / ED DIAGNOSES  Final diagnoses:  Left flank pain     Note: This dictation was prepared with Dragon dictation. Any transcriptional errors that result from this process are unintentional     Phineas Semen, MD 01/03/18 2244

## 2018-01-03 NOTE — ED Triage Notes (Signed)
Pt comes via POV with c/o flank pain that started yesterday and has gotten worse today. Pt states nausea no vomiting. Pt states more pain noted to left side. Pt denies any urinary symptoms. Pt does state that it feels like a UTI.

## 2018-01-03 NOTE — ED Notes (Signed)
Pt ambulatory to POV without difficulty. VSS. NAD. Discharge instructions, RX and follow up reviewed. All questions and concerns addressed.  

## 2018-01-03 NOTE — Discharge Instructions (Addendum)
Please seek medical attention for any high fevers, chest pain, shortness of breath, change in behavior, persistent vomiting, bloody stool or any other new or concerning symptoms.  

## 2018-01-04 ENCOUNTER — Telehealth: Payer: Self-pay

## 2018-01-04 NOTE — Telephone Encounter (Signed)
Patient left voicemail on 9/26 requesting refill of Advair. Medication Management faxed over 2 requests. Patient has not been seen by physician in past year. Needs to keep 10/22 appt for further refills. Left vm for patient with this information.

## 2018-01-05 ENCOUNTER — Ambulatory Visit: Payer: Self-pay

## 2018-01-13 ENCOUNTER — Telehealth: Payer: Self-pay | Admitting: Pharmacist

## 2018-01-13 NOTE — Telephone Encounter (Signed)
01/13/2018 11:59:59 AM - refills-Ventolin & Advair  01/13/18 Placed refill online with [employer] for Ventolin HFA & Advair 250/50, to release 02/15/18, order# [id number].Forde Radon

## 2018-01-25 ENCOUNTER — Other Ambulatory Visit: Payer: Self-pay

## 2018-01-25 ENCOUNTER — Ambulatory Visit: Payer: Self-pay | Admitting: Adult Health Nurse Practitioner

## 2018-01-25 ENCOUNTER — Ambulatory Visit: Payer: Self-pay | Admitting: Internal Medicine

## 2018-01-25 ENCOUNTER — Other Ambulatory Visit: Payer: Self-pay | Admitting: Urology

## 2018-01-25 VITALS — BP 107/64 | HR 65 | Temp 98.1°F | Ht 62.0 in | Wt 119.8 lb

## 2018-01-25 DIAGNOSIS — Z720 Tobacco use: Secondary | ICD-10-CM

## 2018-01-25 DIAGNOSIS — F419 Anxiety disorder, unspecified: Secondary | ICD-10-CM

## 2018-01-25 DIAGNOSIS — Z0000 Encounter for general adult medical examination without abnormal findings: Secondary | ICD-10-CM

## 2018-01-25 DIAGNOSIS — J449 Chronic obstructive pulmonary disease, unspecified: Secondary | ICD-10-CM

## 2018-01-25 MED ORDER — FLUTICASONE-SALMETEROL 250-50 MCG/DOSE IN AEPB: 1.0000 | INHALATION_SPRAY | Freq: Two times a day (BID) | RESPIRATORY_TRACT | 3 refills | Status: DC

## 2018-01-25 MED ORDER — BENZONATATE 100 MG PO CAPS: 100.0000 mg | ORAL_CAPSULE | Freq: Four times a day (QID) | ORAL | 0 refills | Status: AC | PRN

## 2018-01-25 MED ORDER — CETIRIZINE HCL 10 MG PO TABS: 10.0000 mg | ORAL_TABLET | Freq: Every day | ORAL | 2 refills | Status: DC

## 2018-01-25 NOTE — Addendum Note (Signed)
Addended by: Smiley Houseman D on: 01/25/2018 07:17 PM   Modules accepted: Orders

## 2018-01-25 NOTE — Addendum Note (Signed)
Addended by: Madelin Rear A on: 01/25/2018 07:23 PM   Modules accepted: Orders

## 2018-01-25 NOTE — Addendum Note (Signed)
Addended by: Charlene Brooke on: 01/25/2018 07:00 PM   Modules accepted: Orders

## 2018-01-25 NOTE — Progress Notes (Signed)
  Patient: Melissa Dean Female    DOB: 1970/04/29   47 y.o.   MRN: [id number] Visit Date: 01/25/2018  Today's Provider: Jacelyn Pi, NP   Chief Complaint  Patient presents with  . Follow-up    needs refills   Subjective:    HPI  Here for FU and medication refills.    Pt states that her anxiety has improved- using skills despite having lots going on in her life. States that she was seeing Michele Mcalpine in the past and was referred to [facility] and didn't find it helpful so she stopped therapy. Doesn't have an ongoing therapist at this time.   Taking Advair for COPD-works well.  States she doesn't had "bad attacks" Using Ventolin once weekly on average.   No Known Allergies Previous Medications   ALBUTEROL (PROVENTIL HFA;VENTOLIN HFA) 108 (90 BASE) MCG/ACT INHALER    Inhale 2-4 puffs by mouth every 4 hours as needed for wheezing, cough, and/or shortness of breath   BENZONATATE (TESSALON PERLES) 100 MG CAPSULE    Take 1 capsule (100 mg total) by mouth every 6 (six) hours as needed for cough.   FLUTICASONE-SALMETEROL (ADVAIR DISKUS) 250-50 MCG/DOSE AEPB    Inhale 1 puff into the lungs 2 (two) times daily.   FLUTICASONE-SALMETEROL (ADVAIR) 250-50 MCG/DOSE AEPB    Inhale 1 puff into the lungs 2 (two) times daily.    Review of Systems  All other systems reviewed and are negative.   Social History   Tobacco Use  . Smoking status: Current Some Day Smoker    Packs/day: 0.25    Years: 20.00    Pack years: 5.00  . Smokeless tobacco: Never Used  . Tobacco comment: says she still smokes 1-2 every once in awhile  Substance Use Topics  . Alcohol use: No    Alcohol/week: 0.0 standard drinks   Objective:   BP 107/64   Pulse 65   Temp 98.1 F (36.7 C)   Ht 5\' 2"  (1.575 m)   Wt 119 lb 12.8 oz (54.3 kg)   BMI 21.91 kg/m   Physical Exam  Constitutional: She appears well-developed and well-nourished.  Cardiovascular: Normal rate, regular rhythm and normal heart sounds.   Pulmonary/Chest: Effort normal and breath sounds normal.  Diminished in the bases  Abdominal: Soft. Bowel sounds are normal.  Skin: Skin is warm and dry.  Psychiatric: She has a normal mood and affect. Her behavior is normal.  Vitals reviewed.       Assessment & Plan:     COPD:  Stable.  Will rx for Advair, Tesselon Perles PRN and Zyrtec. Encourage smoking cessation.    Anxiety- FU with Heather as needed.     Routine labs ordered. Labs reviewed from recent ED visit.   Flu vaccine given.   Jacelyn Pi, NP   [facility]

## 2018-01-25 NOTE — Addendum Note (Signed)
Addended by: Smiley Houseman D on: 01/25/2018 07:08 PM   Modules accepted: Orders

## 2018-01-26 LAB — TSH: TSH: 1.61 u[IU]/mL (ref 0.450–4.500)

## 2018-01-26 LAB — LIPID PANEL
CHOLESTEROL TOTAL: 136 mg/dL (ref 100–199)
Chol/HDL Ratio: 2.6 ratio (ref 0.0–4.4)
HDL: 53 mg/dL (ref >39)
LDL Calculated: 70 mg/dL (ref 0–99)
TRIGLYCERIDES: 64 mg/dL (ref 0–149)
VLDL Cholesterol Cal: 13 mg/dL (ref 5–40)

## 2018-01-26 LAB — SPECIMEN STATUS REPORT

## 2018-01-28 LAB — HEPATIC FUNCTION PANEL
ALBUMIN: 4.6 g/dL (ref 3.5–5.5)
ALT: 14 IU/L (ref 0–32)
AST: 19 IU/L (ref 0–40)
Alkaline Phosphatase: 62 IU/L (ref 39–117)
BILIRUBIN TOTAL: 0.3 mg/dL (ref 0.0–1.2)
Bilirubin, Direct: 0.1 mg/dL (ref 0.00–0.40)
TOTAL PROTEIN: 6.9 g/dL (ref 6.0–8.5)

## 2018-02-01 ENCOUNTER — Ambulatory Visit: Payer: Self-pay | Admitting: Internal Medicine

## 2018-02-02 ENCOUNTER — Encounter: Payer: Self-pay | Admitting: Internal Medicine

## 2018-04-08 ENCOUNTER — Telehealth: Payer: Self-pay | Admitting: Pharmacist

## 2018-04-08 NOTE — Telephone Encounter (Signed)
04/08/2018 12:11:27 PM - [facility] invoice med not dispensed  04/08/2018 I have received a [facility] invoice dated 01/19/18 where we received Ventolin HFA & Advair 250/50-note on invoice "Not Dispensed due to non pick up". Forde Radon

## 2018-05-11 ENCOUNTER — Telehealth: Payer: Self-pay | Admitting: Pharmacist

## 2018-05-11 NOTE — Telephone Encounter (Signed)
05/11/2018 2:13:35 PM - Advair & Ventolin refills  05/11/2018 Ordered refills for Ventolin HFA & Advair 250/50 online with [facility], to release 05/25/2018, order# [id number].Forde RadonAJ

## 2018-07-05 ENCOUNTER — Other Ambulatory Visit: Payer: Self-pay | Admitting: Adult Health Nurse Practitioner

## 2018-07-25 ENCOUNTER — Telehealth: Payer: Self-pay | Admitting: Internal Medicine

## 2018-07-25 NOTE — Telephone Encounter (Signed)
Needs televist next week with me or if she needs sooner, with provider this week Over 2.5 years without OV

## 2018-07-25 NOTE — Telephone Encounter (Signed)
Called and spoke to pt, who is requesting a letter stating that she is high risk.  Pt is employed at [employer]. Pt states a letter is needed to excuse her from work, due to dx of COPD. Pt last seen 12/22/16 and was instructed to return in 41mo.  I have advised pt to contact PCP. Pt stated that [facility] due [facility]-19.  DK please advise. Thanks.

## 2018-07-25 NOTE — Telephone Encounter (Signed)
I spoke to patient, she is aware of needing OV. Set up with phone visit 08/05/18 with Dr. Belia Heman. Nothing further needed at this time.

## 2018-07-28 ENCOUNTER — Other Ambulatory Visit: Payer: Self-pay

## 2018-07-28 ENCOUNTER — Encounter: Payer: Self-pay | Admitting: Urology

## 2018-07-28 ENCOUNTER — Ambulatory Visit: Payer: Self-pay | Admitting: Urology

## 2018-07-28 VITALS — Wt 119.0 lb

## 2018-07-28 DIAGNOSIS — J449 Chronic obstructive pulmonary disease, unspecified: Secondary | ICD-10-CM

## 2018-07-28 MED ORDER — CETIRIZINE HCL 10 MG PO TABS: 10.0000 mg | ORAL_TABLET | Freq: Every day | ORAL | 0 refills | Status: DC

## 2018-07-28 MED ORDER — FLUTICASONE-SALMETEROL 250-50 MCG/DOSE IN AEPB: 1.0000 | INHALATION_SPRAY | Freq: Two times a day (BID) | RESPIRATORY_TRACT | 3 refills | Status: DC

## 2018-07-28 MED ORDER — ALBUTEROL SULFATE HFA 108 (90 BASE) MCG/ACT IN AERS: INHALATION_SPRAY | RESPIRATORY_TRACT | 1 refills | Status: DC

## 2018-07-28 NOTE — Progress Notes (Signed)
Virtual Visit via Telephone Note  I connected with Melissa Dean on 07/28/2018 at 1900 by telephone and verified that I am speaking with the correct person using two identifiers.  They are located at home.   I am located at my home.    This visit type was conducted due to national recommendations for restrictions regarding the COVID-19 Pandemic (e.g. social distancing).  This format is felt to be most appropriate for this patient at this time.  All issues noted in this document were discussed and addressed.  No physical exam was performed.   I discussed the limitations, risks, security and privacy concerns of performing an evaluation and management service by telephone and the availability of in person appointments. I also discussed with the patient that there may be a patient responsible charge related to this service. The patient expressed understanding and agreed to proceed.   History of Present Illness: Patient with COPD and works at [employer] and states that she feels they do not follow protocol.  She takes Advair daily, ventolin weekly and Zyrtec.     Observations/Objective:   Assessment and Plan: 1. COPD Feels that she is at risk at [facility] due to exposure to several people at the store that do not respect the social distancing and she feels the company does not follow procedure.    Follow Up Instructions:  Keep appointment with Dr.Kasa.    I discussed the assessment and treatment plan with the patient. The patient was provided an opportunity to ask questions and all were answered. The patient agreed with the plan and demonstrated an understanding of the instructions.   The patient was advised to call back or seek an in-person evaluation if the symptoms worsen or if the condition fails to improve as anticipated.  I provided 10 minutes of non-face-to-face time during this encounter.   Makailyn Mccormick, PA-C

## 2018-08-02 ENCOUNTER — Ambulatory Visit: Payer: Self-pay

## 2018-08-05 ENCOUNTER — Ambulatory Visit: Payer: Self-pay | Admitting: Internal Medicine

## 2018-08-18 ENCOUNTER — Other Ambulatory Visit: Payer: Self-pay

## 2018-08-18 ENCOUNTER — Encounter: Payer: Self-pay | Admitting: Urology

## 2018-08-18 ENCOUNTER — Ambulatory Visit: Payer: Self-pay | Admitting: Urology

## 2018-08-18 VITALS — Wt 118.0 lb

## 2018-08-18 DIAGNOSIS — J449 Chronic obstructive pulmonary disease, unspecified: Secondary | ICD-10-CM

## 2018-08-18 NOTE — Progress Notes (Signed)
Virtual Visit via Telephone Note I connected with Melissa Dean on 08/18/2018 at 1842 by telephone and verified that I am speaking with the correct person using two identifiers.  They are located at home.   I am located at my home.    This visit type was conducted due to national recommendations for restrictions regarding the COVID-19 Pandemic (e.g. social distancing).  This format is felt to be most appropriate for this patient at this time.  All issues noted in this document were discussed and addressed.  No physical exam was performed.   I discussed the limitations, risks, security and privacy concerns of performing an evaluation and management service by telephone and the availability of in person appointments. I also discussed with the patient that there may be a patient responsible charge related to this service. The patient expressed understanding and agreed to proceed.   History of Present Illness: Patient with COPD and works at [employer] and states that she feels they do not follow protocol.  She takes Advair daily, ventolin weekly and Zyrtec.    She says that when she woke up this am with asthma attack.  She was wheezing and took two puffs of the albuterol inhaler and her symptoms resolved.    She is not having fevers or a cough.     Observations/Objective:   Assessment and Plan: 1. COPD Feels that she is at risk at [facility] due to exposure to several people at the store that do not respect the social distancing and she feels the company does not follow procedure.    Follow Up Instructions:  Keep appointment with Dr.Kasa - encouraged the patient to reschedule her missed appointment    I discussed the assessment and treatment plan with the patient. The patient was provided an opportunity to ask questions and all were answered. The patient agreed with the plan and demonstrated an understanding of the instructions.   The patient was advised to call back or seek an in-person  evaluation if the symptoms worsen or if the condition fails to improve as anticipated.  I provided 10 minutes of non-face-to-face time during this encounter.   Tabius Rood, PA-C

## 2018-08-19 ENCOUNTER — Telehealth: Payer: Self-pay | Admitting: Pharmacist

## 2018-08-19 NOTE — Telephone Encounter (Signed)
08/19/2018 11:41:14 AM - Advair 250/50 & Ventolin refills  08/19/2018 Faxed [facility] scripts for refills - Advair 250/50 Inhale one puff into the lungs 2 times a day, & Ventolin HFA Inhale 2-4 puffs every 4 hours as needed for wheezing, cough or shortness of breath. Melissa Dean

## 2018-11-08 ENCOUNTER — Emergency Department
Admission: EM | Admit: 2018-11-08 | Discharge: 2018-11-08 | Disposition: A | Discharge status: Home / Self Care | Payer: Self-pay | Attending: Emergency Medicine | Admitting: Emergency Medicine

## 2018-11-08 ENCOUNTER — Encounter: Payer: Self-pay | Admitting: Emergency Medicine

## 2018-11-08 ENCOUNTER — Other Ambulatory Visit: Payer: Self-pay

## 2018-11-08 ENCOUNTER — Emergency Department: Payer: Self-pay

## 2018-11-08 DIAGNOSIS — G43909 Migraine, unspecified, not intractable, without status migrainosus: Secondary | ICD-10-CM | POA: Insufficient documentation

## 2018-11-08 DIAGNOSIS — J449 Chronic obstructive pulmonary disease, unspecified: Secondary | ICD-10-CM | POA: Insufficient documentation

## 2018-11-08 DIAGNOSIS — R519 Headache, unspecified: Secondary | ICD-10-CM

## 2018-11-08 DIAGNOSIS — Z79899 Other long term (current) drug therapy: Secondary | ICD-10-CM | POA: Insufficient documentation

## 2018-11-08 DIAGNOSIS — F17210 Nicotine dependence, cigarettes, uncomplicated: Secondary | ICD-10-CM | POA: Insufficient documentation

## 2018-11-08 MED ORDER — PROCHLORPERAZINE MALEATE 10 MG PO TABS: 10.0000 mg | ORAL_TABLET | Freq: Four times a day (QID) | ORAL | 0 refills | Status: DC | PRN

## 2018-11-08 MED ORDER — DIPHENHYDRAMINE HCL 50 MG/ML IJ SOLN
25.0000 mg | Freq: Once | INTRAMUSCULAR | Status: AC
  Administered 2018-11-08: 25 mg via INTRAVENOUS
  Filled 2018-11-08: qty 1

## 2018-11-08 MED ORDER — LACTATED RINGERS IV BOLUS
1000.0000 mL | Freq: Once | INTRAVENOUS | Status: AC
  Administered 2018-11-08: 1000 mL via INTRAVENOUS

## 2018-11-08 MED ORDER — PROCHLORPERAZINE EDISYLATE 10 MG/2ML IJ SOLN
10.0000 mg | Freq: Once | INTRAMUSCULAR | Status: AC
  Administered 2018-11-08: 12:00:00 10 mg via INTRAVENOUS
  Filled 2018-11-08: qty 2

## 2018-11-08 NOTE — ED Provider Notes (Signed)
[facility] Emergency Department Provider Note   ____________________________________________   First MD Initiated Contact with Patient 11/08/18 1144     (approximate)  I have reviewed the triage vital signs and the nursing notes.   HISTORY  Chief Complaint Headache    HPI Melissa Dean is a 48 y.o. female with past medical history of migraines who presents to the ED complaining of headache.  Patient reports acute onset of right-sided headache approximately an hour and a half prior to arrival.  She describes pain as severe and exacerbated by bright light.  She denies any associated numbness, weakness, or neck stiffness.  She does state that initially her vision appeared blurry but this is since resolved.  She has had issues with headaches in the past, but states none came on as quickly were as severe.  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        Past Medical History:  Diagnosis Date  . Anemia   . Anxiety   . Asthma   . Bronchitis   . COPD (chronic obstructive pulmonary disease) (HCC)   . Depression     Patient Active Problem List   Diagnosis Date Noted  . Healthcare maintenance 01/25/2018  . Housing or economic circumstance 02/10/2017  . Acute respiratory failure with hypoxia (HCC) 07/04/2016  . COPD (chronic obstructive pulmonary disease) (HCC) 07/04/2016  . Acute bronchitis 07/04/2016  . Anxiety 07/18/2014  . Tobacco abuse 07/18/2014  . Abnormal TSH 07/18/2014  . Atypical chest pain 07/18/2014  . Chest pain 07/06/2014    Past Surgical History:  Procedure Laterality Date  . BREAST BIOPSY Right 05/24/2016   coil marker. PSEUDO-ANGIOMATOUS STROMAL HYPERPLASIA. NEGATIVE FOR ATYPIA  . BREAST BIOPSY Right 06/02/2017   ribbon marker. path pending  . CESAREAN SECTION    . CESAREAN SECTION    . CHOLECYSTECTOMY    . TONSILLECTOMY    . TUBAL LIGATION      Prior to Admission medications    Medication Sig Start Date End Date Taking? Authorizing Provider  albuterol (VENTOLIN HFA) 108 (90 Base) MCG/ACT inhaler Inhale 2-4 puffs by mouth every 4 hours as needed for wheezing, cough, and/or shortness of breath 07/28/18   McGowan, Carollee HerterShannon A, PA-C  benzonatate (TESSALON PERLES) 100 MG capsule Take 1 capsule (100 mg total) by mouth every 6 (six) hours as needed for cough. Patient not taking: Reported on 08/18/2018 01/25/18 01/25/19  Doles-Johnson, Teah, NP  cetirizine (ZYRTEC) 10 MG tablet Take 1 tablet (10 mg total) by mouth at bedtime. 07/28/18   McGowan, Wellington HampshireShannon A, PA-C  Fluticasone-Salmeterol (ADVAIR) 250-50 MCG/DOSE AEPB Inhale 1 puff into the lungs 2 (two) times daily. 07/28/18   Michiel CowboyMcGowan, Shannon A, PA-C  prochlorperazine (COMPAZINE) 10 MG tablet Take 1 tablet (10 mg total) by mouth every 6 (six) hours as needed for nausea or vomiting. 11/08/18   Chesley NoonJessup, Cabot Cromartie, MD    Allergies Patient has no known allergies.  Family History  Problem Relation Age of Onset  . Heart disease Father   . Hypertension Father   . Diabetes Father   . Cancer Father        lung cancer  . Diabetes Brother   . Hypertension Paternal Grandfather   . Diabetes Paternal Grandfather   . Thyroid disease Other   . Breast cancer Neg Hx     Social History Social History   Tobacco Use  . Smoking status: Current Some Day Smoker  Packs/day: 0.25    Years: 20.00    Pack years: 5.00  . Smokeless tobacco: Never Used  . Tobacco comment: says she still smokes 1-2 every once in awhile  Substance Use Topics  . Alcohol use: No    Alcohol/week: 0.0 standard drinks  . Drug use: No    Review of Systems  Constitutional: No fever/chills Eyes: No visual changes.  Positive for photophobia. ENT: No sore throat. Cardiovascular: Denies chest pain. Respiratory: Denies shortness of breath. Gastrointestinal: No abdominal pain.  No nausea, no vomiting.  No diarrhea.  No constipation. Genitourinary: Negative for dysuria.  Musculoskeletal: Negative for back pain. Skin: Negative for rash. Neurological: Negative focal weakness or numbness.  Positive for headache.  ____________________________________________   PHYSICAL EXAM:  VITAL SIGNS: ED Triage Vitals [11/08/18 1135]  Enc Vitals Group     BP (!) 146/71     Pulse Rate 67     Resp 18     Temp 98.2 F (36.8 C)     Temp Source Oral     SpO2 100 %     Weight 117 lb 15.1 oz (53.5 kg)     Height 5\' 2"  (1.575 m)     Head Circumference      Peak Flow      Pain Score 9     Pain Loc      Pain Edu?      Excl. in [place]?     Constitutional: Alert and oriented. Eyes: Conjunctivae are normal.  Right pupil minimally reactive and at patient's baseline, right pupil 3 mm and reactive.  Extraocular movements intact. Head: Atraumatic. Nose: No congestion/rhinnorhea. Mouth/Throat: Mucous membranes are moist. Neck: Normal ROM Cardiovascular: Normal rate, regular rhythm. Grossly normal heart sounds. Respiratory: Normal respiratory effort.  No retractions. Lungs CTAB. Gastrointestinal: Soft and nontender. No distention. Genitourinary: deferred Musculoskeletal: No lower extremity tenderness nor edema. Neurologic:  Normal speech and language. No gross focal neurologic deficits are appreciated.  Strength 5 out of 5 in bilateral upper and lower extremities, sensation intact throughout.  Cranial nerves II through XII grossly intact. Skin:  Skin is warm, dry and intact. No rash noted. Psychiatric: Mood and affect are normal. Speech and behavior are normal.  ____________________________________________   LABS (all labs ordered are listed, but only abnormal results are displayed)  Labs Reviewed - No data to display ____________________________________________  ____________________________________________   PROCEDURES  Procedure(s) performed (including Critical Care):  Procedures   ____________________________________________   INITIAL IMPRESSION /  ASSESSMENT AND PLAN / ED COURSE       48 year old female with history of migraines presenting to the ED complaining of severe right sided headache with acute onset about an hour and a half prior to arrival.  Given acute onset, head CT was obtained but negative for subarachnoid hemorrhage.  Do not suspect meningitis as patient afebrile without any meningismus.  Symptoms improved following migraine cocktail, will have patient follow-up with PCP and return to the ED for new or worsening symptoms.  Patient agrees with plan.      ____________________________________________   FINAL CLINICAL IMPRESSION(S) / ED DIAGNOSES  Final diagnoses:  Migraine without status migrainosus, not intractable, unspecified migraine type  Right-sided headache     ED Discharge Orders         Ordered    prochlorperazine (COMPAZINE) 10 MG tablet  Every 6 hours PRN     11/08/18 1431           Note:  This document was prepared  using Conservation officer, historic buildingsDragon voice recognition software and may include unintentional dictation errors.   Chesley NoonJessup, Shelva Hetzer, MD 11/08/18 [phone]

## 2018-11-08 NOTE — ED Triage Notes (Signed)
Headache x 90 minutes.  States vision is blurry and c/o nausea.  C/O pain to right side of head.  AAOx3/  Skin warm and dry.  MAE equally and strong.  Speech clear.  Facial movements equal.  NAD

## 2018-11-08 NOTE — ED Notes (Signed)

## 2018-11-16 ENCOUNTER — Telehealth: Payer: Self-pay | Admitting: Pharmacist

## 2018-11-16 NOTE — Telephone Encounter (Signed)
11/16/2018 2:02:25 PM - [place] enrollment ended 11/16/2018  11/16/2018 Patient's enrollment with [place] for Ventolin & Advair ended 11/16/2018--Unable to renew patient until Recertification packet is returned.AJ 10/28/2018 9:27:14 AM - PENDING RECERT FOR 6283  1/51/7616 PENDING RECERT FOR [id number] NEW MEDS OR RENEWAL OF MEDS WILL BE ORDERED UNTIL PATIENT HAS BEEN RECERTIFIED.Melissa Dean

## 2019-02-07 ENCOUNTER — Other Ambulatory Visit: Payer: Self-pay

## 2019-02-07 DIAGNOSIS — Z20822 Contact with and (suspected) exposure to covid-19: Secondary | ICD-10-CM

## 2019-02-09 LAB — NOVEL CORONAVIRUS, NAA: SARS-CoV-2, NAA: NOT DETECTED

## 2019-03-17 ENCOUNTER — Encounter: Payer: Self-pay | Admitting: *Deleted

## 2019-03-17 NOTE — Progress Notes (Signed)
A Televisit was used to enroll patient into our BCCCP program due to COVID 19.  Verbal consent obtained using 2 identifiers.  Patient has been screened for eligibility.  She does not have any insurance, Medicare or Medicaid.  She also meets financial eligibility.  Patient denies any breast problems.  Last pap on 05/26/17 was negative / negative.  Next pap in 2024.  She is to go to the [facility] for her screening mammogram on 03/21/19 @ 11:30.

## 2019-03-17 NOTE — Progress Notes (Signed)
Tried to call patient to prescreen for her BCCCP appointment next week.  No answer.  Left message to return my call.

## 2019-03-21 ENCOUNTER — Ambulatory Visit
Admission: RE | Admit: 2019-03-21 | Discharge: 2019-03-21 | Disposition: A | Discharge status: Home / Self Care | Payer: Self-pay | Source: Ambulatory Visit | Attending: Oncology | Admitting: Oncology

## 2019-03-21 ENCOUNTER — Ambulatory Visit: Payer: Self-pay | Attending: Oncology | Admitting: *Deleted

## 2019-03-21 ENCOUNTER — Other Ambulatory Visit: Payer: Self-pay

## 2019-03-21 DIAGNOSIS — Z0000 Encounter for general adult medical examination without abnormal findings: Secondary | ICD-10-CM | POA: Insufficient documentation

## 2019-03-21 NOTE — Progress Notes (Signed)
Letter mailed from [facility] to notify of normal mammogram results.  Patient to return in one year for annual screening.  Copy to [facility]. 

## 2019-03-29 ENCOUNTER — Telehealth: Payer: Self-pay | Admitting: Pharmacy Technician

## 2019-03-29 NOTE — Telephone Encounter (Signed)
Patient failed to provide requested 2020 financial documentation.  No additional medication assistance will be provided by [facility] without the required proof of income documentation.  Patient notified by letter.  Rasheema Truluck J. Maleny Candy Care Manager [facility] 

## 2019-09-06 ENCOUNTER — Ambulatory Visit: Payer: Self-pay

## 2019-09-13 ENCOUNTER — Other Ambulatory Visit: Payer: Self-pay

## 2019-09-13 ENCOUNTER — Emergency Department: Payer: Self-pay

## 2019-09-13 ENCOUNTER — Encounter: Payer: Self-pay | Admitting: Emergency Medicine

## 2019-09-13 ENCOUNTER — Emergency Department
Admission: EM | Admit: 2019-09-13 | Discharge: 2019-09-13 | Disposition: A | Discharge status: Home / Self Care | Payer: Self-pay | Attending: Student in an Organized Health Care Education/Training Program | Admitting: Student in an Organized Health Care Education/Training Program

## 2019-09-13 DIAGNOSIS — J449 Chronic obstructive pulmonary disease, unspecified: Secondary | ICD-10-CM | POA: Insufficient documentation

## 2019-09-13 DIAGNOSIS — M545 Low back pain, unspecified: Secondary | ICD-10-CM

## 2019-09-13 DIAGNOSIS — Z79899 Other long term (current) drug therapy: Secondary | ICD-10-CM | POA: Insufficient documentation

## 2019-09-13 DIAGNOSIS — F17210 Nicotine dependence, cigarettes, uncomplicated: Secondary | ICD-10-CM | POA: Insufficient documentation

## 2019-09-13 LAB — URINALYSIS, COMPLETE (UACMP) WITH MICROSCOPIC
Bacteria, UA: NONE SEEN
Bilirubin Urine: NEGATIVE
Glucose, UA: NEGATIVE mg/dL
Ketones, ur: NEGATIVE mg/dL
Leukocytes,Ua: NEGATIVE
Nitrite: NEGATIVE
Protein, ur: NEGATIVE mg/dL
RBC / HPF: >50 RBC/hpf — ABNORMAL HIGH (ref 0–5)
Specific Gravity, Urine: 1.005 (ref 1.005–1.030)
Squamous Epithelial / LPF: NONE SEEN (ref 0–5)
pH: 7 (ref 5.0–8.0)

## 2019-09-13 MED ORDER — HYDROCODONE-ACETAMINOPHEN 5-325 MG PO TABS: 1.0000 | ORAL_TABLET | ORAL | 0 refills | Status: DC | PRN

## 2019-09-13 MED ORDER — NAPROXEN 500 MG PO TABS: 500.0000 mg | ORAL_TABLET | Freq: Two times a day (BID) | ORAL | 0 refills | Status: DC

## 2019-09-13 MED ORDER — METHOCARBAMOL 500 MG PO TABS
1000.0000 mg | ORAL_TABLET | Freq: Once | ORAL | Status: AC
  Administered 2019-09-13: 1000 mg via ORAL
  Filled 2019-09-13: qty 2

## 2019-09-13 MED ORDER — KETOROLAC TROMETHAMINE 30 MG/ML IJ SOLN
30.0000 mg | Freq: Once | INTRAMUSCULAR | Status: AC
  Administered 2019-09-13: 30 mg via INTRAMUSCULAR
  Filled 2019-09-13: qty 1

## 2019-09-13 MED ORDER — METHOCARBAMOL 500 MG PO TABS: ORAL_TABLET | ORAL | 0 refills | Status: DC

## 2019-09-13 MED ORDER — HYDROCODONE-ACETAMINOPHEN 5-325 MG PO TABS
1.0000 | ORAL_TABLET | Freq: Once | ORAL | Status: AC
  Administered 2019-09-13: 1 via ORAL
  Filled 2019-09-13: qty 1

## 2019-09-13 NOTE — ED Provider Notes (Signed)
[facility] Emergency Department Provider Note   ____________________________________________   First MD Initiated Contact with Patient 09/13/19 1139     (approximate)  I have reviewed the triage vital signs and the nursing notes.   HISTORY  Chief Complaint Back Pain    HPI Melissa Dean is a 49 y.o. female presents to the ED with complaint of low back pain.  Patient states that she woke yesterday morning in pain.  She is unaware of any injury.  She states she thought it would improve however it is as bad this morning if not worse.  She denies any urinary symptoms, kidney stones or history of injury to this area.  She denies any paresthesias, saddle anesthesias or incontinence of bowel or bladder.  She states that movement increases her pain.  She rates her pain as a 9 out of 10.       Past Medical History:  Diagnosis Date  . Anemia   . Anxiety   . Asthma   . Bronchitis   . COPD (chronic obstructive pulmonary disease) (HCC)   . Depression     Patient Active Problem List   Diagnosis Date Noted  . Healthcare maintenance 01/25/2018  . Housing or economic circumstance 02/10/2017  . Acute respiratory failure with hypoxia (HCC) 07/04/2016  . COPD (chronic obstructive pulmonary disease) (HCC) 07/04/2016  . Acute bronchitis 07/04/2016  . Anxiety 07/18/2014  . Tobacco abuse 07/18/2014  . Abnormal TSH 07/18/2014  . Atypical chest pain 07/18/2014  . Chest pain 07/06/2014    Past Surgical History:  Procedure Laterality Date  . BREAST BIOPSY Right 05/24/2016   coil marker. PSEUDO-ANGIOMATOUS STROMAL HYPERPLASIA. NEGATIVE FOR ATYPIA  . BREAST BIOPSY Right 06/02/2017   ribbon marker.: COLUMNAR CELL CHANGE WITHMICROCALCIFICATIONS. PSEUDO-ANGIOMATOUS STROMAL HYPERPLASIA  . CESAREAN SECTION    . CESAREAN SECTION    . CHOLECYSTECTOMY    . TONSILLECTOMY    . TUBAL LIGATION      Prior to Admission medications   Medication Sig Start Date End Date  Taking? Authorizing Provider  albuterol (VENTOLIN HFA) 108 (90 Base) MCG/ACT inhaler Inhale 2-4 puffs by mouth every 4 hours as needed for wheezing, cough, and/or shortness of breath 07/28/18   McGowan, Carollee Herter A, PA-C  cetirizine (ZYRTEC) 10 MG tablet Take 1 tablet (10 mg total) by mouth at bedtime. 07/28/18   McGowan, Wellington Hampshire, PA-C  Fluticasone-Salmeterol (ADVAIR) 250-50 MCG/DOSE AEPB Inhale 1 puff into the lungs 2 (two) times daily. 07/28/18   Michiel Cowboy A, PA-C  HYDROcodone-acetaminophen (NORCO/VICODIN) 5-325 MG tablet Take 1 tablet by mouth every 4 (four) hours as needed for moderate pain. 09/13/19   Tommi Rumps, PA-C  methocarbamol (ROBAXIN) 500 MG tablet 1-2 every 6 hours prn muscle spasms 09/13/19   Tommi Rumps, PA-C  naproxen (NAPROSYN) 500 MG tablet Take 1 tablet (500 mg total) by mouth 2 (two) times daily with a meal. 09/13/19   Tommi Rumps, PA-C    Allergies Patient has no known allergies.  Family History  Problem Relation Age of Onset  . Heart disease Father   . Hypertension Father   . Diabetes Father   . Cancer Father        lung cancer  . Diabetes Brother   . Hypertension Paternal Grandfather   . Diabetes Paternal Grandfather   . Thyroid disease Other   . Breast cancer Neg Hx     Social History Social History   Tobacco Use  . Smoking status: Current  Some Day Smoker    Packs/day: 0.25    Years: 20.00    Pack years: 5.00  . Smokeless tobacco: Never Used  . Tobacco comment: says she still smokes 1-2 every once in awhile  Substance Use Topics  . Alcohol use: No    Alcohol/week: 0.0 standard drinks  . Drug use: No    Review of Systems Constitutional: No fever/chills Cardiovascular: Denies chest pain. Respiratory: Denies shortness of breath. Gastrointestinal: No abdominal pain.  No nausea, no vomiting.   Genitourinary: Negative for dysuria.  Positive for menses. Musculoskeletal: Positive for low back pain. Skin: Negative for  rash. Neurological: Negative for headaches, focal weakness or numbness. ____________________________________________   PHYSICAL EXAM:  VITAL SIGNS: ED Triage Vitals  Enc Vitals Group     BP 09/13/19 1141 132/87     Pulse Rate 09/13/19 1141 81     Resp 09/13/19 1141 18     Temp 09/13/19 1141 98.8 F (37.1 C)     Temp Source 09/13/19 1141 Oral     SpO2 09/13/19 1141 96 %     Weight 09/13/19 1122 135 lb (61.2 kg)     Height 09/13/19 1122 5\' 2"  (1.575 m)     Head Circumference --      Peak Flow --      Pain Score 09/13/19 1121 9     Pain Loc --      Pain Edu? --      Excl. in GC? --     Constitutional: Alert and oriented. Well appearing and in no acute distress. Eyes: Conjunctivae are normal. PERRL. EOMI. Head: Atraumatic. Neck: No stridor.   Cardiovascular: Normal rate, regular rhythm. Grossly normal heart sounds.  Good peripheral circulation. Respiratory: Normal respiratory effort.  No retractions. Lungs CTAB. Gastrointestinal:  No CVA tenderness. Musculoskeletal: On examination of the thoracic and lumbar spine there is no gross deformity.  Patient has moderate tenderness L5 and sacral area bilaterally.  Range of motion is restricted secondary to patient's pain.  Movement increases her pain.  Patient is able to stand with her back against the wall with some minimal improvement.  No step-offs were appreciated.  Motor sensory function intact. Neurologic:  Normal speech and language. No gross focal neurologic deficits are appreciated. No gait instability. Skin:  Skin is warm, dry and intact. No rash noted. Psychiatric: Mood and affect are normal. Speech and behavior are normal.  ____________________________________________   LABS (all labs ordered are listed, but only abnormal results are displayed)  Labs Reviewed  URINALYSIS, COMPLETE (UACMP) WITH MICROSCOPIC - Abnormal; Notable for the following components:      Result Value   Color, Urine YELLOW (*)    APPearance CLEAR  (*)    Hgb urine dipstick LARGE (*)    RBC / HPF >50 (*)    All other components within normal limits  POC URINE PREG, ED    RADIOLOGY  Official radiology report(s): DG Lumbar Spine 2-3 Views  Result Date: 09/13/2019 CLINICAL DATA:  Lumbosacral back pain, no known injury. Progressive pain since yesterday. EXAM: LUMBAR SPINE - 2-3 VIEW COMPARISON:  None. FINDINGS: Diminutive ribs at T12. The alignment is maintained. Vertebral body heights are normal. There is no listhesis. Mild disc space narrowing at L4-L5 and L5-S1. Mild lower lumbar facet hypertrophy. No fracture or evidence of focal bone lesion. Sacroiliac joints are symmetric and normal. IMPRESSION: Mild degenerative disc disease and facet hypertrophy in the lower lumbar spine. No acute osseous abnormality. Electronically Signed  By: Keith Rake M.D.   On: 09/13/2019 14:12    ____________________________________________   PROCEDURES  Procedure(s) performed (including Critical Care):  Procedures   ____________________________________________   INITIAL IMPRESSION / ASSESSMENT AND PLAN / ED COURSE  As part of my medical decision making, I reviewed the following data within the electronic MEDICAL RECORD NUMBER Notes from prior ED visits and [place] Controlled Substance Database  49 year old female presents to the ED with complaint of low back pain that started yesterday but was worse this morning.  Patient is unaware of any injury.  She denies any paresthesias or incontinence of bowel or bladder.  She denies any sciatic pain.  Patient was given Toradol 30 mg, methocarbamol 1000 mg p.o. and Norco prior to x-rays.  Patient states that her pain is now tolerable.  Prescriptions were sent to her pharmacy for the methocarbamol, naproxen and Norco.  Patient is instructed to use ice or heat to her back as needed for discomfort.  She is to follow-up with her PCP or congenital clinic acute care if any continued  problems.  ____________________________________________   FINAL CLINICAL IMPRESSION(S) / ED DIAGNOSES  Final diagnoses:  Acute bilateral low back pain without sciatica     ED Discharge Orders         Ordered    HYDROcodone-acetaminophen (NORCO/VICODIN) 5-325 MG tablet  Every 4 hours PRN     09/13/19 1424    methocarbamol (ROBAXIN) 500 MG tablet     09/13/19 1424    naproxen (NAPROSYN) 500 MG tablet  2 times daily with meals     09/13/19 1424           Note:  This document was prepared using Dragon voice recognition software and may include unintentional dictation errors.    Johnn Hai, PA-C 09/13/19 1433    Merlyn Lot, MD 09/13/19 [phone]

## 2019-09-13 NOTE — ED Notes (Signed)
poc preg was negative

## 2019-09-13 NOTE — ED Triage Notes (Signed)
Patient presents to the ED with severe lower back pain that has been worsening since yesterday morning. Patient reports increased pain with deep breathing, walking and changing positions.  Patient denies known injury.

## 2019-09-13 NOTE — ED Notes (Signed)
See triage note  Presents with lower back pain  States pain is at coccyx area  Woke up with pain yesterday  Pain is worse this am  Denies any injury

## 2019-09-13 NOTE — Discharge Instructions (Signed)
Follow-up with your primary care provider or can no clinic acute care if any continued problems.  You may use ice or heat to your back as needed for discomfort.  Begin taking medication as directed.  The hydrocodone is every 4-6 hours if needed for severe pain and Robaxin 1 or 2 tablets every 6 hours for muscle spasms.  The combination of the 2 medications could cause drowsiness.  Do not drive or operate machinery while taking this medication as it could increase your risk for injury.  The naproxen is 500 mg twice daily with food.  If any severe worsening of your symptoms or incontinence of bowel or bladder return to the emergency department.

## 2019-10-19 ENCOUNTER — Other Ambulatory Visit: Payer: Self-pay

## 2019-10-19 ENCOUNTER — Ambulatory Visit: Payer: Self-pay | Admitting: Family Medicine

## 2019-10-19 VITALS — BP 103/63 | HR 68 | Ht 62.0 in | Wt 140.1 lb

## 2019-10-19 DIAGNOSIS — J449 Chronic obstructive pulmonary disease, unspecified: Secondary | ICD-10-CM

## 2019-10-19 MED ORDER — FLUTICASONE-SALMETEROL 250-50 MCG/DOSE IN AEPB: 1.0000 | INHALATION_SPRAY | Freq: Two times a day (BID) | RESPIRATORY_TRACT | 2 refills | Status: DC

## 2019-10-19 MED ORDER — ALBUTEROL SULFATE HFA 108 (90 BASE) MCG/ACT IN AERS: INHALATION_SPRAY | RESPIRATORY_TRACT | 2 refills | Status: DC

## 2019-10-19 NOTE — Progress Notes (Signed)
[facility]   Progress Note: General Provider: Mike Gip, FNP  SUBJECTIVE:   Melissa Dean is a 49 y.o. female who  has a past medical history of Anemia, Anxiety, Asthma, Bronchitis, COPD (chronic obstructive pulmonary disease) (HCC), and Depression.. Patient presents today for COPD  Patient states that she is doing well on her current medications. She reports that she is here for refills. She continues to smoke half a pack per day and is trying to cut down. She states that her SOB is relieved with inhaler use. She reports that she has not been taking her Advair twice a day due to fear of running out. She denies unusual phlegm, wheezing, or cough.   Review of Systems  Constitutional: Negative.   HENT: Negative.   Eyes: Negative.   Respiratory: Negative.   Cardiovascular: Negative.   Gastrointestinal: Negative.   Genitourinary: Negative.   Musculoskeletal: Negative.   Skin: Negative.   Neurological: Negative.   Psychiatric/Behavioral: Negative.      OBJECTIVE: BP 103/63 (BP Location: Left Arm, Patient Position: Sitting)   Pulse 68   Ht 5\' 2"  (1.575 m)   Wt 140 lb 1.6 oz (63.5 kg)   SpO2 98%   BMI 25.62 kg/m   Wt Readings from Last 3 Encounters:  10/19/19 140 lb 1.6 oz (63.5 kg)  09/13/19 135 lb (61.2 kg)  11/08/18 117 lb 15.1 oz (53.5 kg)     Physical Exam Vitals and nursing note reviewed.  Constitutional:      General: She is not in acute distress.    Appearance: She is well-developed.  HENT:     Head: Normocephalic and atraumatic.  Eyes:     Conjunctiva/sclera: Conjunctivae normal.     Pupils: Pupils are equal, round, and reactive to light.  Cardiovascular:     Rate and Rhythm: Normal rate and regular rhythm.     Heart sounds: Normal heart sounds.  Pulmonary:     Effort: Pulmonary effort is normal. No respiratory distress.     Breath sounds: Normal breath sounds.  Abdominal:     General: Bowel sounds are normal. There is no  distension.     Palpations: Abdomen is soft.  Musculoskeletal:        General: Normal range of motion.     Cervical back: Normal range of motion.  Skin:    General: Skin is warm and dry.  Neurological:     Mental Status: She is alert and oriented to person, place, and time.  Psychiatric:        Behavior: Behavior normal.        Thought Content: Thought content normal.     ASSESSMENT/PLAN:   1. Chronic obstructive pulmonary disease, unspecified COPD type (HCC) No medication changes warranted at the present time.  - Fluticasone-Salmeterol (ADVAIR) 250-50 MCG/DOSE AEPB; Inhale 1 puff into the lungs 2 (two) times daily.  Dispense: 60 each; Refill: 2 - albuterol (VENTOLIN HFA) 108 (90 Base) MCG/ACT inhaler; Inhale 2-4 puffs by mouth every 4 hours as needed for wheezing, cough, and/or shortness of breath  Dispense: 18 g; Refill: 2   Smoking cessation instruction/counseling given:  counseled patient on the dangers of tobacco use, advised patient to stop smoking, and reviewed strategies to maximize success   Return in about 3 months (around 01/19/2020) for COPD.    The patient was given clear instructions to go to ER or return to medical center if symptoms do not improve, worsen or new problems develop. The patient  verbalized understanding and agreed with plan of care.   Ms. Freda Jackson. Riley Lam, FNP-BC [facility]

## 2019-10-19 NOTE — Patient Instructions (Signed)
Chronic Obstructive Pulmonary Disease Chronic obstructive pulmonary disease (COPD) is a long-term (chronic) lung problem. When you have COPD, it is hard for air to get in and out of your lungs. Usually the condition gets worse over time, and your lungs will never return to normal. There are things you can do to keep yourself as healthy as possible.  Your doctor may treat your condition with: ? Medicines. ? Oxygen. ? Lung surgery.  Your doctor may also recommend: ? Rehabilitation. This includes steps to make your body work better. It may involve a team of specialists. ? Quitting smoking, if you smoke. ? Exercise and changes to your diet. ? Comfort measures (palliative care). Follow these instructions at home: Medicines  Take over-the-counter and prescription medicines only as told by your doctor.  Talk to your doctor before taking any cough or allergy medicines. You may need to avoid medicines that cause your lungs to be dry. Lifestyle  If you smoke, stop. Smoking makes the problem worse. If you need help quitting, ask your doctor.  Avoid being around things that make your breathing worse. This may include smoke, chemicals, and fumes.  Stay active, but remember to rest as well.  Learn and use tips on how to relax.  Make sure you get enough sleep. Most adults need at least 7 hours of sleep every night.  Eat healthy foods. Eat smaller meals more often. Rest before meals. Controlled breathing Learn and use tips on how to control your breathing as told by your doctor. Try:  Breathing in (inhaling) through your nose for 1 second. Then, pucker your lips and breath out (exhale) through your lips for 2 seconds.  Putting one hand on your belly (abdomen). Breathe in slowly through your nose for 1 second. Your hand on your belly should move out. Pucker your lips and breathe out slowly through your lips. Your hand on your belly should move in as you breathe out.  Controlled coughing Learn  and use controlled coughing to clear mucus from your lungs. Follow these steps: 1. Lean your head a little forward. 2. Breathe in deeply. 3. Try to hold your breath for 3 seconds. 4. Keep your mouth slightly open while coughing 2 times. 5. Spit any mucus out into a tissue. 6. Rest and do the steps again 1 or 2 times as needed. General instructions  Make sure you get all the shots (vaccines) that your doctor recommends. Ask your doctor about a flu shot and a pneumonia shot.  Use oxygen therapy and pulmonary rehabilitation if told by your doctor. If you need home oxygen therapy, ask your doctor if you should buy a tool to measure your oxygen level (oximeter).  Make a COPD action plan with your doctor. This helps you to know what to do if you feel worse than usual.  Manage any other conditions you have as told by your doctor.  Avoid going outside when it is very hot, cold, or humid.  Avoid people who have a sickness you can catch (contagious).  Keep all follow-up visits as told by your doctor. This is important. Contact a doctor if:  You cough up more mucus than usual.  There is a change in the color or thickness of the mucus.  It is harder to breathe than usual.  Your breathing is faster than usual.  You have trouble sleeping.  You need to use your medicines more often than usual.  You have trouble doing your normal activities such as getting dressed   or walking around the house. Get help right away if:  You have shortness of breath while resting.  You have shortness of breath that stops you from: ? Being able to talk. ? Doing normal activities.  Your chest hurts for longer than 5 minutes.  Your skin color is more blue than usual.  Your pulse oximeter shows that you have low oxygen for longer than 5 minutes.  You have a fever.  You feel too tired to breathe normally. Summary  Chronic obstructive pulmonary disease (COPD) is a long-term lung problem.  The way your  lungs work will never return to normal. Usually the condition gets worse over time. There are things you can do to keep yourself as healthy as possible.  Take over-the-counter and prescription medicines only as told by your doctor.  If you smoke, stop. Smoking makes the problem worse. This information is not intended to replace advice given to you by your health care provider. Make sure you discuss any questions you have with your health care provider. Document Revised: 03/05/2017 Document Reviewed: 04/27/2016 Elsevier Patient Education  2020 Elsevier Inc.  

## 2019-10-30 ENCOUNTER — Other Ambulatory Visit: Payer: Self-pay

## 2019-10-30 ENCOUNTER — Ambulatory Visit: Payer: Self-pay | Admitting: Pharmacy Technician

## 2019-10-30 DIAGNOSIS — Z79899 Other long term (current) drug therapy: Secondary | ICD-10-CM

## 2019-10-30 NOTE — Progress Notes (Signed)
Completed [facility] application and contract.  Patient agreed to all terms of the [facility] contract.    Patient approved to receive medication assistance at [facility] until time for re-certificiation in 2022, and as long as eligibility criteria continues to be met.    Provided patient with community resource material based on her particular needs.    Betty J. Kluttz Care Manager [facility]  

## 2019-11-15 ENCOUNTER — Other Ambulatory Visit: Payer: Self-pay

## 2019-11-15 MED ORDER — CETIRIZINE HCL 10 MG PO TABS: 10.0000 mg | ORAL_TABLET | Freq: Every day | ORAL | 0 refills | Status: DC

## 2019-11-21 ENCOUNTER — Telehealth: Payer: Self-pay | Admitting: Pharmacist

## 2019-11-21 NOTE — Telephone Encounter (Signed)
11/21/2019 9:10:37 AM - [employer] renewal for Advair 250/50  -- Rhetta Mura - Tuesday, November 21, 2019 9:09 AM --Faxed [employer] renewal application for Advair 250/50.

## 2019-12-19 ENCOUNTER — Ambulatory Visit: Payer: Self-pay | Admitting: Specialist

## 2019-12-19 ENCOUNTER — Other Ambulatory Visit: Payer: Self-pay

## 2019-12-19 DIAGNOSIS — M25559 Pain in unspecified hip: Secondary | ICD-10-CM

## 2019-12-19 NOTE — Progress Notes (Signed)
Established Patient Office Visit  Subjective:  Patient ID: Melissa Dean, female    DOB: 1970-11-27  Age: 49 y.o. MRN: [id number]  CC: No chief complaint on file.   HPI Rileigh Kawashima presents for   She's heavily tattooed. 49 yr old with intermittent of R lilac crest pain. Worse over the past several months. No Hx of trauma. It is worse getting up from a sitting position. 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 Trouble sleeping. Able to do ADLs.   Past Medical History:  Diagnosis Date  . Anemia   . Anxiety   . Asthma   . Bronchitis   . COPD (chronic obstructive pulmonary disease) (HCC)   . Depression     Past Surgical History:  Procedure Laterality Date  . BREAST BIOPSY Right 05/24/2016   coil marker. PSEUDO-ANGIOMATOUS STROMAL HYPERPLASIA. NEGATIVE FOR ATYPIA  . BREAST BIOPSY Right 06/02/2017   ribbon marker.: COLUMNAR CELL CHANGE WITHMICROCALCIFICATIONS. PSEUDO-ANGIOMATOUS STROMAL HYPERPLASIA  . CESAREAN SECTION    . CESAREAN SECTION    . CHOLECYSTECTOMY    . TONSILLECTOMY    . TUBAL LIGATION      Family History  Problem Relation Age of Onset  . Heart disease Father   . Hypertension Father   . Diabetes Father   . Cancer Father        lung cancer  . Diabetes Brother   . Hypertension Paternal Grandfather   . Diabetes Paternal Grandfather   . Thyroid disease Other   . Breast cancer Neg Hx     Social History   Socioeconomic History  . Marital status: Single    Spouse name: Not on file  . Number of children: Not on file  . Years of education: Not on file  . Highest education level: Not on file  Occupational History  . Not on file  Tobacco Use  . Smoking status: Current Some Day Smoker    Packs/day: 0.25    Years: 20.00    Pack years: 5.00  . Smokeless tobacco: Never Used  . Tobacco comment: says she still smokes 1-2 every once in awhile  Vaping Use  . Vaping Use: Never used  Substance and Sexual Activity  . Alcohol use: No    Alcohol/week: 0.0 standard  drinks  . Drug use: No  . Sexual activity: Not Currently  Other Topics Concern  . Not on file  Social History Narrative  . Not on file   Social Determinants of Health   Financial Resource Strain:   . Difficulty of Paying Living Expenses: Not on file  Food Insecurity:   . Worried About Programme researcher, broadcasting/film/video in the Last Year: Not on file  . Ran Out of Food in the Last Year: Not on file  Transportation Needs:   . Lack of Transportation (Medical): Not on file  . Lack of Transportation (Non-Medical): Not on file  Physical Activity:   . Days of Exercise per Week: Not on file  . Minutes of Exercise per Session: Not on file  Stress:   . Feeling of Stress : Not on file  Social Connections:   . Frequency of Communication with Friends and Family: Not on file  . Frequency of Social Gatherings with Friends and Family: Not on file  . Attends Religious Services: Not on file  . Active Member of Clubs or Organizations: Not on file  . Attends Banker Meetings: Not on file  . Marital Status: Not on file  Intimate Partner Violence:   .  Fear of Current or Ex-Partner: Not on file  . Emotionally Abused: Not on file  . Physically Abused: Not on file  . Sexually Abused: Not on file    Outpatient Medications Prior to Visit  Medication Sig Dispense Refill  . albuterol (VENTOLIN HFA) 108 (90 Base) MCG/ACT inhaler Inhale 2-4 puffs by mouth every 4 hours as needed for wheezing, cough, and/or shortness of breath 18 g 2  . cetirizine (ZYRTEC) 10 MG tablet Take 1 tablet (10 mg total) by mouth at bedtime. 90 tablet 0  . Fluticasone-Salmeterol (ADVAIR) 250-50 MCG/DOSE AEPB Inhale 1 puff into the lungs 2 (two) times daily. 60 each 2  . HYDROcodone-acetaminophen (NORCO/VICODIN) 5-325 MG tablet Take 1 tablet by mouth every 4 (four) hours as needed for moderate pain. 15 tablet 0  . methocarbamol (ROBAXIN) 500 MG tablet 1-2 every 6 hours prn muscle spasms (Patient not taking: Reported on 10/19/2019) 20  tablet 0  . naproxen (NAPROSYN) 500 MG tablet Take 1 tablet (500 mg total) by mouth 2 (two) times daily with a meal. 20 tablet 0   No facility-administered medications prior to visit.    No Known Allergies  ROS Review of Systems    Objective:    Physical Exam   Gait is normal. She is able to heel/toe walk. Able to do 50% of a normal squat, rises in a monophasic fashion. Able to march in place with normal muscle  recruitment. Trellingber is normal on L, equivaicall on R. Mildly TTP. In supine position, she has FROM of both hips w/o pain.  There were no vitals taken for this visit. Wt Readings from Last 3 Encounters:  10/19/19 140 lb 1.6 oz (63.5 kg)  09/13/19 135 lb (61.2 kg)  11/08/18 117 lb 15.1 oz (53.5 kg)     Health Maintenance Due  Topic Date Due  . Hepatitis C Screening  Never done  . COVID-19 Vaccine (1) Never done  . TETANUS/TDAP  Never done  . INFLUENZA VACCINE  11/05/2019    There are no preventive care reminders to display for this patient.  Lab Results  Component Value Date   TSH 1.610 01/25/2018   Lab Results  Component Value Date   WBC 9.5 01/03/2018   HGB 13.5 01/03/2018   HCT 38.7 01/03/2018   MCV 95.0 01/03/2018   PLT 198 01/03/2018   Lab Results  Component Value Date   NA 140 01/03/2018   K 3.6 01/03/2018   CO2 25 01/03/2018   GLUCOSE 84 01/03/2018   BUN 11 01/03/2018   CREATININE 0.67 01/03/2018   BILITOT 0.3 01/25/2018   ALKPHOS 62 01/25/2018   AST 19 01/25/2018   ALT 14 01/25/2018   PROT 6.9 01/25/2018   ALBUMIN 4.6 01/25/2018   CALCIUM 8.9 01/03/2018   ANIONGAP 6 01/03/2018   Lab Results  Component Value Date   CHOL 136 01/25/2018   Lab Results  Component Value Date   HDL 53 01/25/2018   Lab Results  Component Value Date   LDLCALC 70 01/25/2018   Lab Results  Component Value Date   TRIG 64 01/25/2018   Lab Results  Component Value Date   CHOLHDL 2.6 01/25/2018   Lab Results  Component Value Date   HGBA1C 5.3  07/06/2014      Assessment & Plan:  Assessment: R side pelvic pain  Plan: AP on pelvis, AP and lateral on the R hip.  Return to clinic when getting x-rays.   Problem List Items Addressed This Visit  None      No orders of the defined types were placed in this encounter.   Follow-up: No follow-ups on file.    Rod Mae

## 2019-12-21 ENCOUNTER — Other Ambulatory Visit: Payer: Self-pay

## 2019-12-21 ENCOUNTER — Ambulatory Visit
Admission: RE | Admit: 2019-12-21 | Discharge: 2019-12-21 | Disposition: A | Discharge status: Home / Self Care | Payer: Self-pay | Source: Ambulatory Visit | Attending: Gerontology | Admitting: Gerontology

## 2019-12-21 ENCOUNTER — Ambulatory Visit
Admission: RE | Admit: 2019-12-21 | Discharge: 2019-12-21 | Disposition: A | Discharge status: Home / Self Care | Payer: Self-pay | Attending: Gerontology | Admitting: Gerontology

## 2019-12-21 DIAGNOSIS — M25559 Pain in unspecified hip: Secondary | ICD-10-CM

## 2019-12-21 DIAGNOSIS — M25551 Pain in right hip: Secondary | ICD-10-CM | POA: Insufficient documentation

## 2020-01-04 ENCOUNTER — Other Ambulatory Visit: Payer: Self-pay

## 2020-01-04 ENCOUNTER — Ambulatory Visit: Payer: Self-pay | Admitting: Pharmacist

## 2020-01-04 DIAGNOSIS — Z79899 Other long term (current) drug therapy: Secondary | ICD-10-CM

## 2020-01-04 NOTE — Progress Notes (Addendum)
[facility] Visit Note  Patient: Melissa Dean MRN: [id number] Date of Birth: 1971/01/04 PCP: System, Provider Not In   [name] 49 y.o. female presents for a MTM visit today. As this was a telephone visit, patient was identified by name and DOB.  There were no vitals taken for this visit.  Patient Information   Past Medical History:  Diagnosis Date   Anemia    Anxiety    Asthma    Bronchitis    COPD (chronic obstructive pulmonary disease) (HCC)    Depression       Past Surgical History:  Procedure Laterality Date   BREAST BIOPSY Right 05/24/2016   coil marker. PSEUDO-ANGIOMATOUS STROMAL HYPERPLASIA. NEGATIVE FOR ATYPIA   BREAST BIOPSY Right 06/02/2017   ribbon marker.: COLUMNAR CELL CHANGE WITHMICROCALCIFICATIONS. PSEUDO-ANGIOMATOUS STROMAL HYPERPLASIA   CESAREAN SECTION     CESAREAN SECTION     CHOLECYSTECTOMY     TONSILLECTOMY     TUBAL LIGATION       Family History  Problem Relation Age of Onset   Hypertension Father    Diabetes Father    Cancer Father        lung cancer   Diabetes Brother    Hypertension Paternal Grandfather    Diabetes Paternal Grandfather    Thyroid disease Other    Multiple sclerosis Brother    Heart disease Granddaughter    Down syndrome Granddaughter    Breast cancer Neg Hx               Social History   Substance and Sexual Activity  Alcohol Use No   Alcohol/week: 0.0 standard drinks      Social History   Tobacco Use  Smoking Status Current Some Day Smoker   Packs/day: 0.25   Years: 20.00   Pack years: 5.00  Smokeless Tobacco Never Used  Tobacco Comment   says she still smokes 1-2 every once in awhile      Health Maintenance  Topic Date Due   Hepatitis C Screening  Never done   COVID-19 Vaccine (1) Never done   TETANUS/TDAP  Never done   INFLUENZA VACCINE  11/05/2019   PAP SMEAR-Modifier  05/26/2020   HIV Screening  Completed    Health  Maintenance/Date Completed  Last ED visit: 09/2019 Low Back Pain Last Visit to PCP: 9/14 Next Visit to PCP: 10/14 or 10/19 Specialist Visit: No Dental Exam: Yes.  Eye Exam: Yes Pelvic/PAP Exam: 2019 Mammogram: 2021 (2 biopsies)  Colonoscopy: No. Flu Vaccine: No. Got one year before. [facility].  Pneumonia Vaccine: No COVID-19 Vaccine: Second dose August 3rd; Pfizer Shingrix Vaccine: No.    Outpatient Encounter Medications as of 01/04/2020  Medication Sig   albuterol (VENTOLIN HFA) 108 (90 Base) MCG/ACT inhaler Inhale 2-4 puffs by mouth every 4 hours as needed for wheezing, cough, and/or shortness of breath (Patient taking differently: Inhale 2-4 puffs by mouth every 4 hours as needed for wheezing, cough, and/or shortness of breath. PRN-requires 1 X Q2 weeks)   cetirizine (ZYRTEC) 10 MG tablet Take 1 tablet (10 mg total) by mouth at bedtime.   Fluticasone-Salmeterol (ADVAIR) 250-50 MCG/DOSE AEPB Inhale 1 puff into the lungs 2 (two) times daily.   ibuprofen (ADVIL) 200 MG tablet Take 800 mg by mouth every 6 (six) hours as needed. Q3-4 days   naproxen (NAPROSYN) 500 MG tablet Take 1 tablet (500 mg total) by mouth 2 (two) times daily with a meal. (Patient taking differently: Take 500 mg by mouth 2 (  two) times daily with a meal. PRN 1X week)   [DISCONTINUED] HYDROcodone-acetaminophen (NORCO/VICODIN) 5-325 MG tablet Take 1 tablet by mouth every 4 (four) hours as needed for moderate pain.   [DISCONTINUED] methocarbamol (ROBAXIN) 500 MG tablet 1-2 every 6 hours prn muscle spasms (Patient not taking: Reported on 10/19/2019)   No facility-administered encounter medications on file as of 01/04/2020.     Assessment and Plan:  Adherence: Keeps Advair by the bedside, and takes in the morning and at night. Excellent adherence.   COPD/Asthma: Patient states only needing albuterol 2-3 times per week, which has improved greatly since starting scheduled Advair.  -Reports only having nighttime symptoms  if A/C is running in bedroom. Patient successfully identified this trigger. Prevents by keeping A/C fan off, and using fan that is aimed away from the face, which has been effective in preventing nighttime awakenings.    Pain: Patient takes PRN naproxen (Q1week) for back pain and PRN ibuprofen (Q3-4 days) for headache. - Advised patient to avoid taking these two medications on the same day, as they work similarly and can increase the risk of kidney damage and stomach ulcers.  -Advised patient that naproxen is safer for kidneys as compared to ibuprofen, but noted that acetaminophen is preferred if possible.  -Inquired of acetaminophen use, however patient states it does not help with headache.  -Advised patient to follow up with [facility] concerning headache, as she is requiring NSAID's Q3-4 days.   Smoking cessation: Patient is very motivated to quit smoking, as she will be receiving dental implants soon. Prefers not to begin nicotine patches, and instead wishes to completely stop smoking without assistance of cessation therapy. Advised patient that there are local resources available. Offered 1-800QUITLINE. Patient plans to utilize resource.  Health Maintenance: Patient has not received flu vaccine. Emphasized importance of receiving flu vaccine. Plans to inquire of flu vaccine with [facility]. Patient inquires of date for next appointment, as she is unsure of whether it is scheduled for Oct 14 or 19. Checked appointment tabs but unfortunately could not see details of appointment, however there were 2 appointments scheduled on the corresponding dates. Advised patient to call [facility] to confirm date of next appointment.  RTC: 1 year  Elliot Gurney, Lucienne Minks PharmD Student  Cosigned: Iona Beard. Joelene Millin, PharmD [facility] Clinic-Pharmacy Operations Coordinator [phone]

## 2020-01-17 IMAGING — MG DIGITAL SCREENING BILAT W/ TOMO W/ CAD
6 of 10 series · 6 of 30 positions shown · non-contrast
Comparison: Previous exam(s).

CLINICAL DATA: Screening.

EXAM:
DIGITAL SCREENING BILATERAL MAMMOGRAM WITH TOMO AND CAD

[R MLO synth-2D]
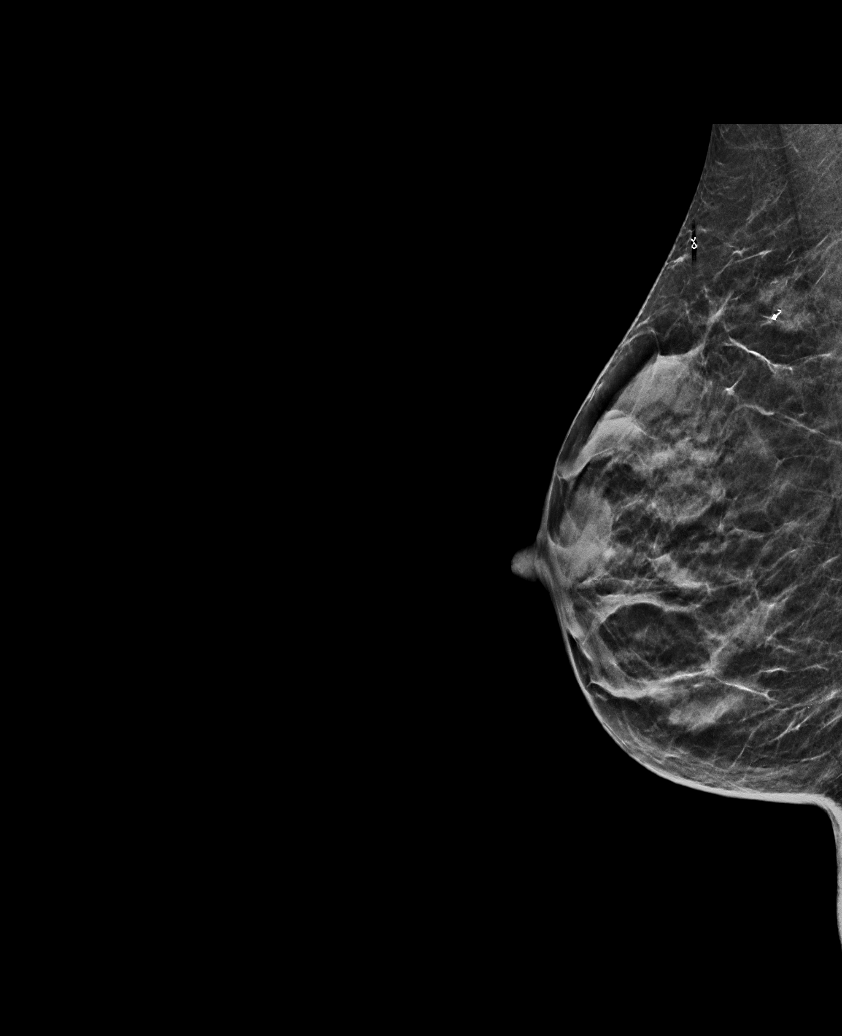

[R CC synth-2D]
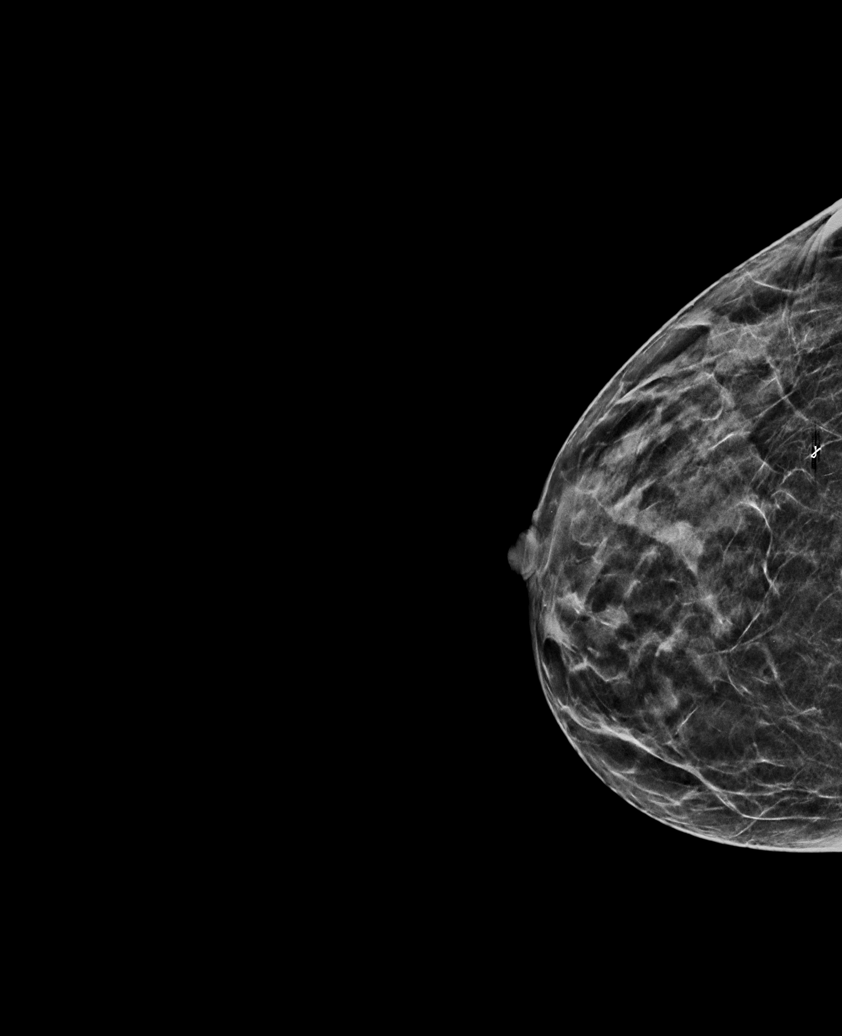

[L MLO synth-2D]
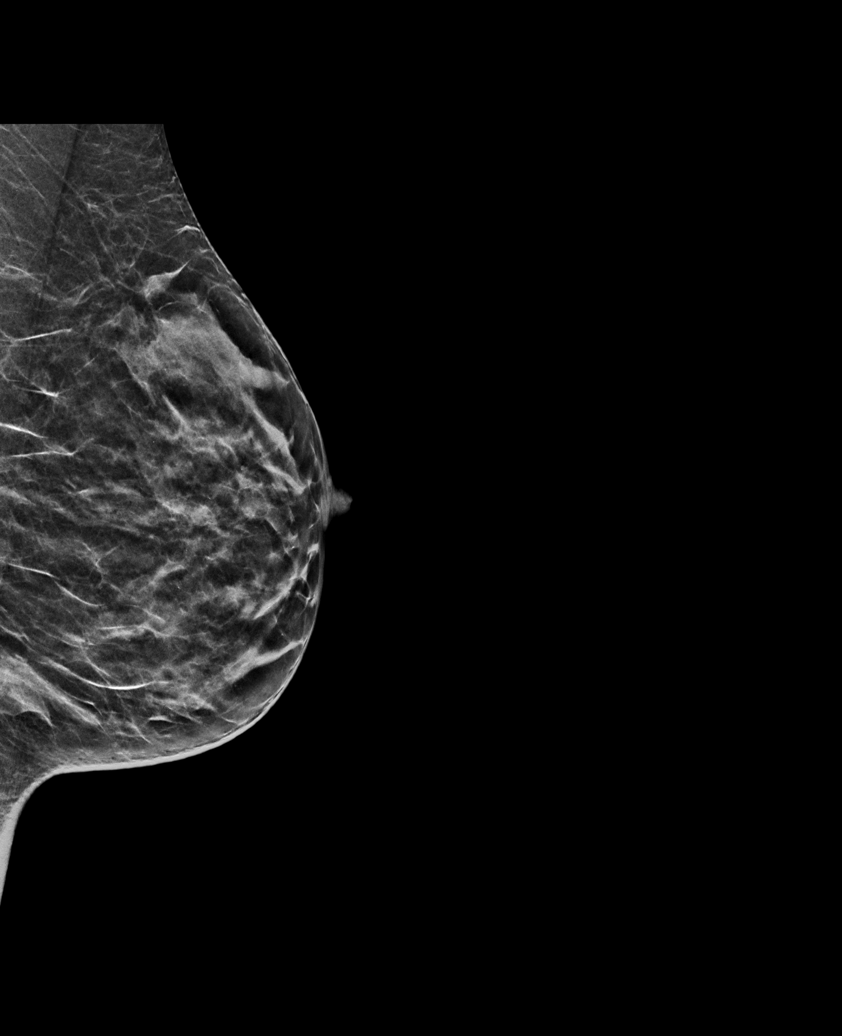

[L CC synth-2D (1 of 2)]
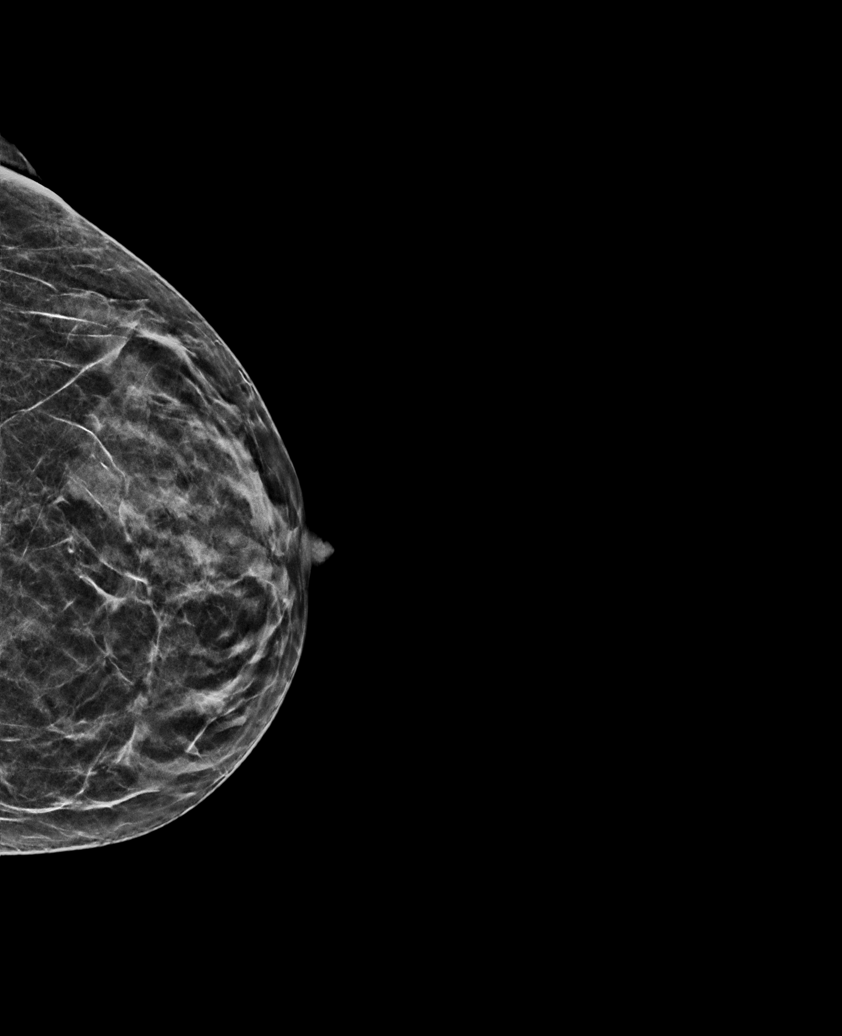

[L CC synth-2D (2 of 2)]
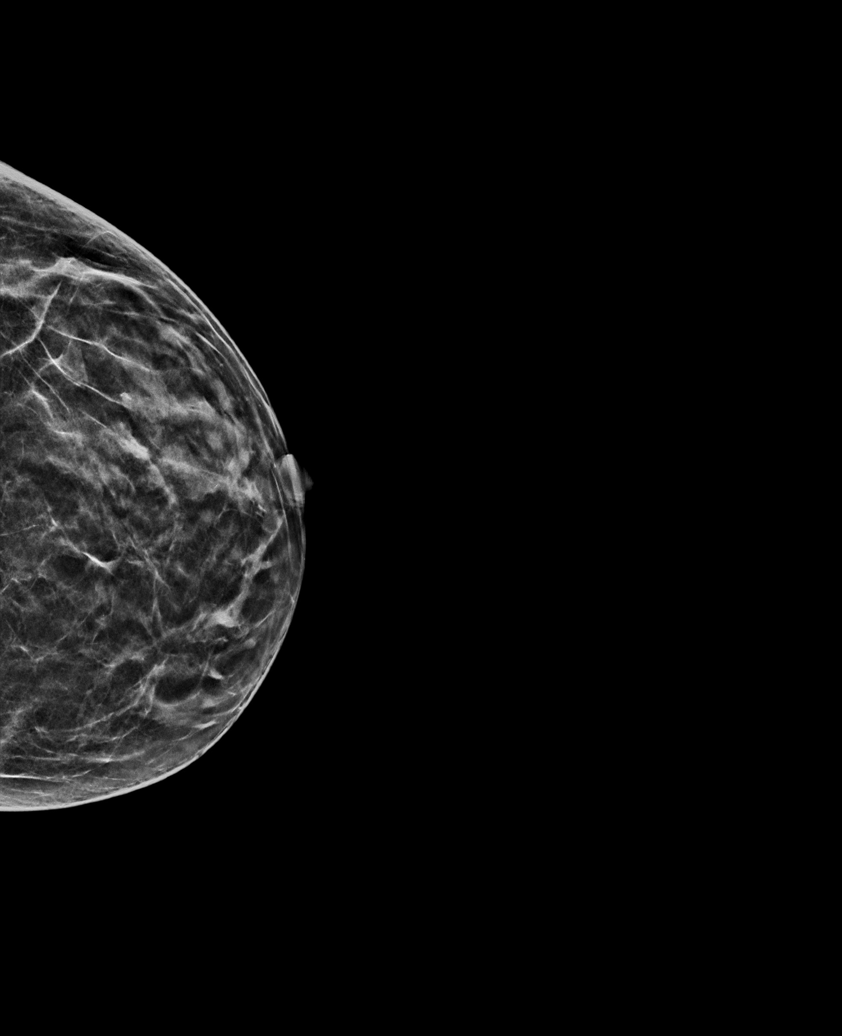

[R MLO tomo · tomo slice 21/42.0]
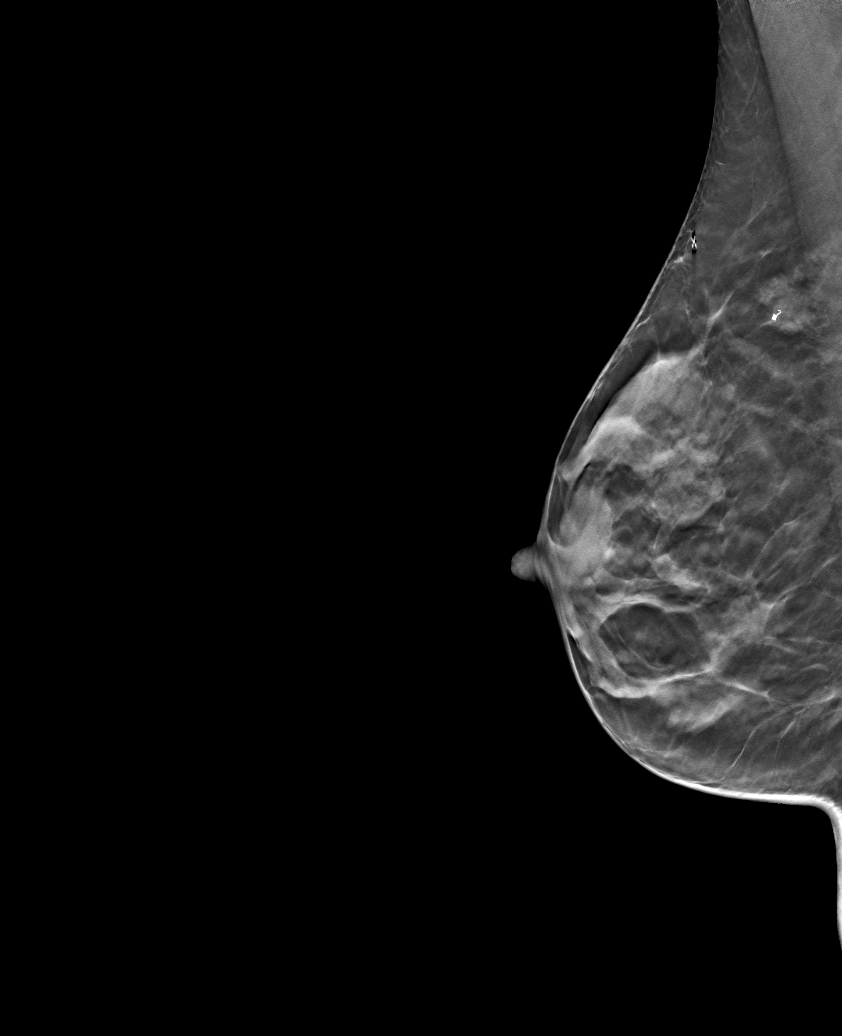

[6 of 30 positions shown; findings below may reference images not displayed]

ACR Breast Density Category c: The breast tissue is heterogeneously
dense, which may obscure small masses.
FINDINGS: There are no findings suspicious for malignancy. Images were
processed with CAD.
IMPRESSION: No mammographic evidence of malignancy. A result letter of this
screening mammogram will be mailed directly to the patient.

RECOMMENDATION:
Screening mammogram in one year. (Code:[id number])

BI-RADS CATEGORY  1: Negative.

## 2020-01-18 ENCOUNTER — Other Ambulatory Visit: Payer: Self-pay

## 2020-01-18 ENCOUNTER — Ambulatory Visit: Payer: Self-pay | Admitting: Gerontology

## 2020-01-18 VITALS — BP 113/70 | HR 74 | Temp 97.9°F | Resp 18 | Ht 62.0 in | Wt 140.3 lb

## 2020-01-18 DIAGNOSIS — Z0000 Encounter for general adult medical examination without abnormal findings: Secondary | ICD-10-CM

## 2020-01-18 DIAGNOSIS — J449 Chronic obstructive pulmonary disease, unspecified: Secondary | ICD-10-CM

## 2020-01-18 MED ORDER — FLUTICASONE-SALMETEROL 250-50 MCG/DOSE IN AEPB: 1.0000 | INHALATION_SPRAY | Freq: Two times a day (BID) | RESPIRATORY_TRACT | 2 refills | Status: DC

## 2020-01-18 NOTE — Progress Notes (Signed)
[facility]  Progress Note: General Provider: Wolfgang Phoenix, NP  SUBJECTIVE:   Melissa Dean is a 49 y.o. female who  has a past medical history of Anemia, Anxiety, Asthma, Bronchitis, COPD (chronic obstructive pulmonary disease) ([place]), and Depression. Patient presents today for COPD Follow-up She states that her COPD has been well controlled since she is using ADVAIR and she used albuterol inhaler about 2-3 times in the last 3 months. She states that she is planning to quit smoking before she has a dental implant around Nov 2021. She states that she is now under half a pack a day and she is going to start using nicotine patch over the counter in the next 2 days.  She denies wheezing, shortness of breath or cough. Overall, she states that she's doing well and offers no further complaint.   Review of Systems  Constitutional: Negative.   HENT: Negative.   Eyes: Negative.   Respiratory: Negative.   Cardiovascular: Negative.   Gastrointestinal: Negative.   Genitourinary: Negative.   Musculoskeletal: Positive for joint pain (right hip 3/10).  Skin: Negative.   Neurological: Negative.   Endo/Heme/Allergies: Negative.   Psychiatric/Behavioral: Negative.      OBJECTIVE: BP 113/70 (BP Location: Left Arm, Cuff Size: Normal)    Pulse 74    Temp 97.9 F (36.6 C) (Oral)    Resp 18    Ht $R'5\' 2"'TF$  (1.575 m)    Wt 140 lb 4.8 oz (63.6 kg)    LMP 01/04/2020 (Approximate)    SpO2 96%    BMI 25.66 kg/m   Wt Readings from Last 3 Encounters:  01/18/20 140 lb 4.8 oz (63.6 kg)  10/19/19 140 lb 1.6 oz (63.5 kg)  09/13/19 135 lb (61.2 kg)     Physical Exam Vitals reviewed.  Constitutional:      Appearance: Normal appearance.  HENT:     Mouth/Throat:     Mouth: Mucous membranes are moist.  Cardiovascular:     Rate and Rhythm: Normal rate and regular rhythm.     Pulses: Normal pulses.     Heart sounds: Normal heart sounds.  Pulmonary:     Effort: Pulmonary effort is normal.      Breath sounds: Normal breath sounds.  Abdominal:     General: Abdomen is flat. Bowel sounds are normal.     Palpations: Abdomen is soft.  Musculoskeletal:        General: Normal range of motion.  Skin:    General: Skin is warm and dry.  Neurological:     General: No focal deficit present.     Mental Status: She is alert and oriented to person, place, and time.  Psychiatric:        Mood and Affect: Mood normal.        Behavior: Behavior normal.     ASSESSMENT/PLAN:   1. Chronic obstructive pulmonary disease, unspecified COPD type (HCC) - Fluticasone-Salmeterol (ADVAIR) 250-50 MCG/DOSE AEPB; Inhale 1 puff into the lungs 2 (two) times daily.  Dispense: 60 each; Refill: 2 -albuterol (Ventolin HFA) 108 (90 base) MCG/ACT inhaler 2-4 puffs by mouth every 4 hours as needed for wheezing, cough and /or shortness of breath -albuterol (Ventolin HFA) 108 (90 Base) MCG/ACT inhaler. Inhale 2-4 puffs by mouth every 4 hours as needed for wheezing, cough, and /or shortness of breath.   2. Health care maintenance  - CBC - Comp Met (CMET) - Lipid Panel With LDL/HDL Ratio - TSH  Return in about 3  months (around 04/19/2020), or if symptoms worsen or fail to improve.    The patient was given clear instructions to go to ER or return to medical center if symptoms do not improve, worsen or new problems develop. The patient verbalized understanding and agreed with plan of care.  Hisham Provence, [place]

## 2020-01-18 NOTE — Patient Instructions (Signed)

## 2020-01-21 ENCOUNTER — Emergency Department: Payer: Self-pay

## 2020-01-21 ENCOUNTER — Emergency Department
Admission: EM | Admit: 2020-01-21 | Discharge: 2020-01-21 | Disposition: A | Discharge status: Home / Self Care | Payer: Self-pay | Attending: Emergency Medicine | Admitting: Emergency Medicine

## 2020-01-21 ENCOUNTER — Other Ambulatory Visit: Payer: Self-pay

## 2020-01-21 DIAGNOSIS — J45909 Unspecified asthma, uncomplicated: Secondary | ICD-10-CM | POA: Insufficient documentation

## 2020-01-21 DIAGNOSIS — M25551 Pain in right hip: Secondary | ICD-10-CM | POA: Insufficient documentation

## 2020-01-21 DIAGNOSIS — F17200 Nicotine dependence, unspecified, uncomplicated: Secondary | ICD-10-CM | POA: Insufficient documentation

## 2020-01-21 MED ORDER — HYDROCODONE-ACETAMINOPHEN 5-325 MG PO TABS
1.0000 | ORAL_TABLET | Freq: Once | ORAL | Status: AC
  Administered 2020-01-21: 1 via ORAL
  Filled 2020-01-21: qty 1

## 2020-01-21 MED ORDER — PREDNISONE 10 MG (21) PO TBPK: ORAL_TABLET | ORAL | 0 refills | Status: DC

## 2020-01-21 MED ORDER — HYDROCODONE-ACETAMINOPHEN 5-325 MG PO TABS
1.0000 | ORAL_TABLET | Freq: Four times a day (QID) | ORAL | 0 refills | Status: DC | PRN
Start: 2020-01-21 — End: 2020-02-08

## 2020-01-21 NOTE — ED Triage Notes (Signed)
Pt comes pov with right hip pain. Pt has been seeing PCP for this but today while walking at [employer] pt states that she couldn't walk on it anymore. Worsening pain. Denies injury, popping, numbness, etc.

## 2020-01-21 NOTE — ED Provider Notes (Signed)
[facility] Emergency Department Provider Note  ____________________________________________   First MD Initiated Contact with Patient 01/21/20 1952     (approximate)  I have reviewed the triage vital signs and the nursing notes.   HISTORY  Chief Complaint Hip Pain    HPI Melissa Dean is a 49 y.o. female presents emergency department complaining of severe right hip pain which is worse in the last couple of days.  She states she has had the hip pain for months.  [facility] which were [facility].  She has an [facility].  She states that this point she cannot even bear weight on the right leg.  She denies any falls or injuries.  No history of EtOH abuse.  No IV drug use    Past Medical History:  Diagnosis Date  . Anemia   . Anxiety   . Asthma   . Bronchitis   . COPD (chronic obstructive pulmonary disease) (HCC)   . Depression     Patient Active Problem List   Diagnosis Date Noted  . Healthcare maintenance 01/25/2018  . Housing or economic circumstance 02/10/2017  . Acute respiratory failure with hypoxia (HCC) 07/04/2016  . COPD (chronic obstructive pulmonary disease) (HCC) 07/04/2016  . Acute bronchitis 07/04/2016  . Anxiety 07/18/2014  . Tobacco abuse 07/18/2014  . Abnormal TSH 07/18/2014  . Atypical chest pain 07/18/2014  . Chest pain 07/06/2014    Past Surgical History:  Procedure Laterality Date  . BREAST BIOPSY Right 05/24/2016   coil marker. PSEUDO-ANGIOMATOUS STROMAL HYPERPLASIA. NEGATIVE FOR ATYPIA  . BREAST BIOPSY Right 06/02/2017   ribbon marker.: COLUMNAR CELL CHANGE WITHMICROCALCIFICATIONS. PSEUDO-ANGIOMATOUS STROMAL HYPERPLASIA  . CESAREAN SECTION    . CESAREAN SECTION    . CHOLECYSTECTOMY    . TONSILLECTOMY    . TUBAL LIGATION      Prior to Admission medications   Medication Sig Start Date End Date Taking?  Authorizing Provider  albuterol (VENTOLIN HFA) 108 (90 Base) MCG/ACT inhaler Inhale 2-4 puffs by mouth every 4 hours as needed for wheezing, cough, and/or shortness of breath Patient taking differently: Inhale 2-4 puffs by mouth every 4 hours as needed for wheezing, cough, and/or shortness of breath. PRN-requires 1 X Q2 weeks 10/19/19   Mike Gip, FNP  cetirizine (ZYRTEC) 10 MG tablet Take 1 tablet (10 mg total) by mouth at bedtime. 11/15/19   Mike Gip, FNP  Fluticasone-Salmeterol (ADVAIR) 250-50 MCG/DOSE AEPB Inhale 1 puff into the lungs 2 (two) times daily. 01/18/20   Abate, Desta A, NP  HYDROcodone-acetaminophen (NORCO/VICODIN) 5-325 MG tablet Take 1 tablet by mouth every 6 (six) hours as needed for moderate pain. 01/21/20   Elgar Scoggins, Roselyn Bering, PA-C  ibuprofen (ADVIL) 200 MG tablet Take 800 mg by mouth every 6 (six) hours as needed. Q3-4 days    [provider]  naproxen (NAPROSYN) 500 MG tablet Take 1 tablet (500 mg total) by mouth 2 (two) times daily with a meal. Patient taking differently: Take 500 mg by mouth 2 (two) times daily with a meal. PRN 1X week 09/13/19   Tommi Rumps, PA-C  predniSONE (STERAPRED UNI-PAK 21 TAB) 10 MG (21) TBPK tablet Take 6 pills on day one then decrease by 1 pill each day 01/21/20   Faythe Ghee, PA-C    Allergies Patient has no known allergies.  Family History  Problem Relation Age of Onset  . Hypertension  Father   . Diabetes Father   . Cancer Father        lung cancer  . Diabetes Brother   . Hypertension Paternal Grandfather   . Diabetes Paternal Grandfather   . Thyroid disease Other   . Multiple sclerosis Brother   . Heart disease Granddaughter   . Down syndrome Granddaughter   . Breast cancer Neg Hx     Social History Social History   Tobacco Use  . Smoking status: Current Some Day Smoker    Packs/day: 0.25    Years: 20.00    Pack years: 5.00  . Smokeless tobacco: Never Used  . Tobacco comment: says she still smokes  1-2 every once in awhile  Vaping Use  . Vaping Use: Never used  Substance Use Topics  . Alcohol use: No    Alcohol/week: 0.0 standard drinks  . Drug use: No    Review of Systems  Constitutional: No fever/chills Eyes: No visual changes. ENT: No sore throat. Respiratory: Denies cough Cardiovascular: Denies chest pain Gastrointestinal: Denies abdominal pain Genitourinary: Negative for dysuria. Musculoskeletal: Negative for back pain.  Positive for right hip pain Skin: Negative for rash. Psychiatric: no mood changes,     ____________________________________________   PHYSICAL EXAM:  VITAL SIGNS: ED Triage Vitals [01/21/20 1821]  Enc Vitals Group     BP 130/72     Pulse Rate 61     Resp 18     Temp 98.1 F (36.7 C)     Temp Source Oral     SpO2 99 %     Weight 140 lb (63.5 kg)     Height 5\' 2"  (1.575 m)     Head Circumference      Peak Flow      Pain Score 9     Pain Loc      Pain Edu?      Excl. in GC?     Constitutional: Alert and oriented. Well appearing and in no acute distress. Eyes: Conjunctivae are normal.  Head: Atraumatic. Nose: No congestion/rhinnorhea. Mouth/Throat: Mucous membranes are moist.   Neck:  supple no lymphadenopathy noted Cardiovascular: Normal rate, regular rhythm. l Respiratory: Normal respiratory effort.  No retractions GU: deferred Musculoskeletal: FROM all extremities, warm and well perfused, range of motion does reproduce pain in the right hip, trochanter bursa is not tender, lumbar spine is mildly tender, neurovascular is intact Neurologic:  Normal speech and language.  Skin:  Skin is warm, dry and intact. No rash noted. Psychiatric: Mood and affect are normal. Speech and behavior are normal.  ____________________________________________   LABS (all labs ordered are listed, but only abnormal results are displayed)  Labs Reviewed - No data to  display ____________________________________________   ____________________________________________  RADIOLOGY  X-ray of the right hip is negative CT of the pelvis does not show a fracture along the hip  ____________________________________________   PROCEDURES  Procedure(s) performed: No  Procedures    ____________________________________________   INITIAL IMPRESSION / ASSESSMENT AND PLAN / ED COURSE  Pertinent labs & imaging results that were available during my care of the patient were reviewed by me and considered in my medical decision making (see chart for details).   Patient is a 49 year old female presents with right hip pain.  See HPI.  Physical exam does show the patient to be tender on the hip.  X-rays are negative so do have concerns for stress fractures due to the amount of pain that she is having.  CT of  the pelvis was ordered and is negative.  Patient is to follow-up with orthopedics, she does [facility]  She is given crutches, steroids, and a prescription for pain medication.  She was also instructed to return if worsening.     Melissa Dean was evaluated in Emergency Department on 01/21/2020 for the symptoms described in the history of present illness. She was evaluated in the context of the global COVID-19 pandemic, which necessitated consideration that the patient might be at risk for infection with the SARS-CoV-2 virus that causes COVID-19. Institutional protocols and algorithms that pertain to the evaluation of patients at risk for COVID-19 are in a state of rapid change based on information released by regulatory bodies including the CDC and federal and state organizations. These policies and algorithms were followed during the patient's care in the ED.    As part of my medical decision making, I reviewed the following data within the electronic MEDICAL RECORD NUMBER Nursing notes reviewed and incorporated,  Old chart reviewed, Radiograph reviewed , Notes from prior ED visits and [place] Controlled Substance Database  ____________________________________________   FINAL CLINICAL IMPRESSION(S) / ED DIAGNOSES  Final diagnoses:  Acute right hip pain      NEW MEDICATIONS STARTED DURING THIS VISIT:  New Prescriptions   HYDROCODONE-ACETAMINOPHEN (NORCO/VICODIN) 5-325 MG TABLET    Take 1 tablet by mouth every 6 (six) hours as needed for moderate pain.   PREDNISONE (STERAPRED UNI-PAK 21 TAB) 10 MG (21) TBPK TABLET    Take 6 pills on day one then decrease by 1 pill each day     Note:  This document was prepared using Dragon voice recognition software and may include unintentional dictation errors.    Faythe Ghee, PA-C 01/21/20 2124    Dionne Bucy, MD 01/21/20 [phone]

## 2020-01-21 NOTE — Discharge Instructions (Addendum)
[facility].  If worsening please return emergency department.  Take medications as prescribed.  Apply ice to hip

## 2020-01-21 NOTE — ED Notes (Signed)
Pt states having pain to the right hip for months, but been getting worse.  Pt states no known trauma and is now unable to bear weight

## 2020-01-23 ENCOUNTER — Ambulatory Visit: Payer: Self-pay | Admitting: Specialist

## 2020-01-23 ENCOUNTER — Other Ambulatory Visit: Payer: Self-pay | Admitting: Gerontology

## 2020-01-23 ENCOUNTER — Other Ambulatory Visit: Payer: Self-pay

## 2020-01-23 DIAGNOSIS — M25559 Pain in unspecified hip: Secondary | ICD-10-CM

## 2020-01-23 DIAGNOSIS — J449 Chronic obstructive pulmonary disease, unspecified: Secondary | ICD-10-CM

## 2020-01-23 NOTE — Progress Notes (Signed)
°  Subjective:     Patient ID: Melissa Dean, female   DOB: 1970/10/06, 49 y.o.   MRN: [id number]  HPI   Since our last visit, the pain has moved distal ward more to the Tronchanteric area, 2 days ago, she had to go to the ED because of increased pain. They did Xray and CT scan which where negative. They diagnosed possible Bursitis. She began  Medrol Dose Pack today.     Objective:   Physical Exam    Today, she is using crutches, TDWB. She is minimally tender about the right Torchanter. With her standiing, had her flex her Hip to 90 degree. She has 40 degres of ER  AND 20 degrees of IR which somewhat uncomfortable. Has 30 degrees of ABD which is uncomfortable.  Assessment:     - Orthopedic referal for possible evaluation and Steroid injection    Plan:

## 2020-02-08 ENCOUNTER — Encounter: Payer: Self-pay | Admitting: Gerontology

## 2020-02-08 ENCOUNTER — Ambulatory Visit: Payer: Self-pay | Admitting: Gerontology

## 2020-02-08 ENCOUNTER — Other Ambulatory Visit: Payer: Self-pay

## 2020-02-08 ENCOUNTER — Other Ambulatory Visit: Payer: Self-pay | Admitting: Gerontology

## 2020-02-08 VITALS — BP 122/75 | HR 77 | Wt 140.3 lb

## 2020-02-08 DIAGNOSIS — M25559 Pain in unspecified hip: Secondary | ICD-10-CM

## 2020-02-08 DIAGNOSIS — M25551 Pain in right hip: Secondary | ICD-10-CM

## 2020-02-08 MED ORDER — CETIRIZINE HCL 10 MG PO TABS
10.0000 mg | ORAL_TABLET | Freq: Every day | ORAL | 0 refills | Status: DC
Start: 2020-02-08 — End: 2020-02-08

## 2020-02-08 NOTE — Patient Instructions (Signed)
DASH Eating Plan DASH stands for "Dietary Approaches to Stop Hypertension." The DASH eating plan is a healthy eating plan that has been shown to reduce high blood pressure (hypertension). It may also reduce your risk for type 2 diabetes, heart disease, and stroke. The DASH eating plan may also help with weight loss. What are tips for following this plan?  General guidelines  Avoid eating more than 2,300 mg (milligrams) of salt (sodium) a day. If you have hypertension, you may need to reduce your sodium intake to 1,500 mg a day.  Limit alcohol intake to no more than 1 drink a day for nonpregnant women and 2 drinks a day for men. One drink equals 12 oz of beer, 5 oz of wine, or 1 oz of hard liquor.  Work with your health care provider to maintain a healthy body weight or to lose weight. Ask what an ideal weight is for you.  Get at least 30 minutes of exercise that causes your heart to beat faster (aerobic exercise) most days of the week. Activities may include walking, swimming, or biking.  Work with your health care provider or diet and nutrition specialist (dietitian) to adjust your eating plan to your individual calorie needs. Reading food labels   Check food labels for the amount of sodium per serving. Choose foods with less than 5 percent of the Daily Value of sodium. Generally, foods with less than 300 mg of sodium per serving fit into this eating plan.  To find whole grains, look for the word "whole" as the first word in the ingredient list. Shopping  Buy products labeled as "low-sodium" or "no salt added."  Buy fresh foods. Avoid canned foods and premade or frozen meals. Cooking  Avoid adding salt when cooking. Use salt-free seasonings or herbs instead of table salt or sea salt. Check with your health care provider or pharmacist before using salt substitutes.  Do not fry foods. Cook foods using healthy methods such as baking, boiling, grilling, and broiling instead.  Cook with  heart-healthy oils, such as olive, canola, soybean, or sunflower oil. Meal planning  Eat a balanced diet that includes: ? 5 or more servings of fruits and vegetables each day. At each meal, try to fill half of your plate with fruits and vegetables. ? Up to 6-8 servings of whole grains each day. ? Less than 6 oz of lean meat, poultry, or fish each day. A 3-oz serving of meat is about the same size as a deck of cards. One egg equals 1 oz. ? 2 servings of low-fat dairy each day. ? A serving of nuts, seeds, or beans 5 times each week. ? Heart-healthy fats. Healthy fats called Omega-3 fatty acids are found in foods such as flaxseeds and coldwater fish, like sardines, salmon, and mackerel.  Limit how much you eat of the following: ? Canned or prepackaged foods. ? Food that is high in trans fat, such as fried foods. ? Food that is high in saturated fat, such as fatty meat. ? Sweets, desserts, sugary drinks, and other foods with added sugar. ? Full-fat dairy products.  Do not salt foods before eating.  Try to eat at least 2 vegetarian meals each week.  Eat more home-cooked food and less restaurant, buffet, and fast food.  When eating at a restaurant, ask that your food be prepared with less salt or no salt, if possible. What foods are recommended? The items listed may not be a complete list. Talk with your dietitian about   what dietary choices are best for you. Grains Whole-grain or whole-wheat bread. Whole-grain or whole-wheat pasta. Brown rice. Oatmeal. Quinoa. Bulgur. Whole-grain and low-sodium cereals. Pita bread. Low-fat, low-sodium crackers. Whole-wheat flour tortillas. Vegetables Fresh or frozen vegetables (raw, steamed, roasted, or grilled). Low-sodium or reduced-sodium tomato and vegetable juice. Low-sodium or reduced-sodium tomato sauce and tomato paste. Low-sodium or reduced-sodium canned vegetables. Fruits All fresh, dried, or frozen fruit. Canned fruit in natural juice (without  added sugar). Meat and other protein foods Skinless chicken or turkey. Ground chicken or turkey. Pork with fat trimmed off. Fish and seafood. Egg whites. Dried beans, peas, or lentils. Unsalted nuts, nut butters, and seeds. Unsalted canned beans. Lean cuts of beef with fat trimmed off. Low-sodium, lean deli meat. Dairy Low-fat (1%) or fat-free (skim) milk. Fat-free, low-fat, or reduced-fat cheeses. Nonfat, low-sodium ricotta or cottage cheese. Low-fat or nonfat yogurt. Low-fat, low-sodium cheese. Fats and oils Soft margarine without trans fats. Vegetable oil. Low-fat, reduced-fat, or light mayonnaise and salad dressings (reduced-sodium). Canola, safflower, olive, soybean, and sunflower oils. Avocado. Seasoning and other foods Herbs. Spices. Seasoning mixes without salt. Unsalted popcorn and pretzels. Fat-free sweets. What foods are not recommended? The items listed may not be a complete list. Talk with your dietitian about what dietary choices are best for you. Grains Baked goods made with fat, such as croissants, muffins, or some breads. Dry pasta or rice meal packs. Vegetables Creamed or fried vegetables. Vegetables in a cheese sauce. Regular canned vegetables (not low-sodium or reduced-sodium). Regular canned tomato sauce and paste (not low-sodium or reduced-sodium). Regular tomato and vegetable juice (not low-sodium or reduced-sodium). Pickles. Olives. Fruits Canned fruit in a light or heavy syrup. Fried fruit. Fruit in cream or butter sauce. Meat and other protein foods Fatty cuts of meat. Ribs. Fried meat. Bacon. Sausage. Bologna and other processed lunch meats. Salami. Fatback. Hotdogs. Bratwurst. Salted nuts and seeds. Canned beans with added salt. Canned or smoked fish. Whole eggs or egg yolks. Chicken or turkey with skin. Dairy Whole or 2% milk, cream, and half-and-half. Whole or full-fat cream cheese. Whole-fat or sweetened yogurt. Full-fat cheese. Nondairy creamers. Whipped toppings.  Processed cheese and cheese spreads. Fats and oils Butter. Stick margarine. Lard. Shortening. Ghee. Bacon fat. Tropical oils, such as coconut, palm kernel, or palm oil. Seasoning and other foods Salted popcorn and pretzels. Onion salt, garlic salt, seasoned salt, table salt, and sea salt. Worcestershire sauce. Tartar sauce. Barbecue sauce. Teriyaki sauce. Soy sauce, including reduced-sodium. Steak sauce. Canned and packaged gravies. Fish sauce. Oyster sauce. Cocktail sauce. Horseradish that you find on the shelf. Ketchup. Mustard. Meat flavorings and tenderizers. Bouillon cubes. Hot sauce and Tabasco sauce. Premade or packaged marinades. Premade or packaged taco seasonings. Relishes. Regular salad dressings. Where to find more information:  National Heart, Lung, and Blood Institute: www.nhlbi.nih.gov  American Heart Association: www.heart.org Summary  The DASH eating plan is a healthy eating plan that has been shown to reduce high blood pressure (hypertension). It may also reduce your risk for type 2 diabetes, heart disease, and stroke.  With the DASH eating plan, you should limit salt (sodium) intake to 2,300 mg a day. If you have hypertension, you may need to reduce your sodium intake to 1,500 mg a day.  When on the DASH eating plan, aim to eat more fresh fruits and vegetables, whole grains, lean proteins, low-fat dairy, and heart-healthy fats.  Work with your health care provider or diet and nutrition specialist (dietitian) to adjust your eating plan to your   individual calorie needs. This information is not intended to replace advice given to you by your health care provider. Make sure you discuss any questions you have with your health care provider. Document Revised: 03/05/2017 Document Reviewed: 03/16/2016 Elsevier Patient Education  2020 Else[employer] Inc.  

## 2020-02-08 NOTE — Progress Notes (Signed)
  [facility] [place] [facility]   Progress Note: General Provider: Regino Bellow, NP  SUBJECTIVE:   Melissa Dean is a 49 y.o. female who  has a past medical history of Anemia, Anxiety, Asthma, Bronchitis, COPD (chronic obstructive pulmonary disease) (HCC), and Depression.. Patient presents today for Follow-up (Pt was on restrictions for right hip pain limiting and was put on restrictions limiting her from working but feels better now and would like to return to work.) She states that she is feeling 100% better and wants to return to work. She reports that limited mobility and pain to the right iliac crest area, right gluteal, and right lower extremity resolved a week ago. She denies numbness/ tingling or decreased range of motion. She states that she quit smoking three days ago. She states that she is doing well and offers no further complaints.  Review of Systems  Constitutional: Negative.   Eyes: Negative.   Respiratory: Negative.   Cardiovascular: Negative.   Gastrointestinal: Negative.   Genitourinary: Negative.   Musculoskeletal: Negative.   Skin: Negative.   Neurological: Negative.   Endo/Heme/Allergies: Negative.   Psychiatric/Behavioral: Negative.      OBJECTIVE: BP 122/75   Pulse 77   Wt 140 lb 4.8 oz (63.6 kg)   SpO2 98%   BMI 25.66 kg/m   Wt Readings from Last 3 Encounters:  02/08/20 140 lb 4.8 oz (63.6 kg)  01/21/20 140 lb (63.5 kg)  01/18/20 140 lb 4.8 oz (63.6 kg)     Physical Exam Constitutional:      Appearance: Normal appearance.  Cardiovascular:     Rate and Rhythm: Normal rate and regular rhythm.     Pulses: Normal pulses.     Heart sounds: Normal heart sounds.  Pulmonary:     Effort: Pulmonary effort is normal.     Breath sounds: Normal breath sounds.  Abdominal:     General: Bowel sounds are normal.     Palpations: Abdomen is soft.  Musculoskeletal:        General: Normal range of motion.     Cervical back: Normal range of motion.  Skin:     General: Skin is warm.  Neurological:     General: No focal deficit present.     Mental Status: She is alert and oriented to person, place, and time.  Psychiatric:        Mood and Affect: Mood normal.     ASSESSMENT/PLAN:  1. Right hip pain     Resolved  Return in about 3 months (around 05/10/2020), or if symptoms worsen or fail to improve.    The patient was given clear instructions to go to ER or return to medical center if symptoms do not improve, worsen or new problems develop. The patient verbalized understanding and agreed with plan of care.  Doak Mah, AGNP [facility]

## 2020-02-09 LAB — COMPREHENSIVE METABOLIC PANEL
ALT: 13 IU/L (ref 0–32)
AST: 15 IU/L (ref 0–40)
Albumin/Globulin Ratio: 1.8 (ref 1.2–2.2)
Albumin: 4 g/dL (ref 3.8–4.8)
Alkaline Phosphatase: 59 IU/L (ref 44–121)
BUN/Creatinine Ratio: 18 (ref 9–23)
BUN: 11 mg/dL (ref 6–24)
Bilirubin Total: <0.2 mg/dL (ref 0.0–1.2)
CO2: 24 mmol/L (ref 20–29)
Calcium: 9.1 mg/dL (ref 8.7–10.2)
Chloride: 105 mmol/L (ref 96–106)
Creatinine, Ser: 0.62 mg/dL (ref 0.57–1.00)
GFR calc Af Amer: 124 mL/min/{1.73_m2} (ref >59)
GFR calc non Af Amer: 108 mL/min/{1.73_m2} (ref >59)
Globulin, Total: 2.2 g/dL (ref 1.5–4.5)
Glucose: 77 mg/dL (ref 65–99)
Potassium: 4 mmol/L (ref 3.5–5.2)
Sodium: 141 mmol/L (ref 134–144)
Total Protein: 6.2 g/dL (ref 6.0–8.5)

## 2020-02-09 LAB — LIPID PANEL
Chol/HDL Ratio: 3 ratio (ref 0.0–4.4)
Cholesterol, Total: 134 mg/dL (ref 100–199)
HDL: 45 mg/dL (ref >39)
LDL Chol Calc (NIH): 68 mg/dL (ref 0–99)
Triglycerides: 117 mg/dL (ref 0–149)
VLDL Cholesterol Cal: 21 mg/dL (ref 5–40)

## 2020-02-09 LAB — CBC WITH DIFFERENTIAL/PLATELET
Basophils Absolute: 0.1 10*3/uL (ref 0.0–0.2)
Basos: 1 %
EOS (ABSOLUTE): 0.1 10*3/uL (ref 0.0–0.4)
Eos: 2 %
Hematocrit: 37.8 % (ref 34.0–46.6)
Hemoglobin: 12.9 g/dL (ref 11.1–15.9)
Immature Grans (Abs): 0 10*3/uL (ref 0.0–0.1)
Immature Granulocytes: 0 %
Lymphocytes Absolute: 2.6 10*3/uL (ref 0.7–3.1)
Lymphs: 31 %
MCH: 32.2 pg (ref 26.6–33.0)
MCHC: 34.1 g/dL (ref 31.5–35.7)
MCV: 94 fL (ref 79–97)
Monocytes Absolute: 1 10*3/uL — ABNORMAL HIGH (ref 0.1–0.9)
Monocytes: 12 %
Neutrophils Absolute: 4.8 10*3/uL (ref 1.4–7.0)
Neutrophils: 54 %
Platelets: 166 10*3/uL (ref 150–450)
RBC: 4.01 x10E6/uL (ref 3.77–5.28)
RDW: 12.1 % (ref 11.7–15.4)
WBC: 8.6 10*3/uL (ref 3.4–10.8)

## 2020-02-09 LAB — TSH: TSH: 1.96 u[IU]/mL (ref 0.450–4.500)

## 2020-03-07 ENCOUNTER — Telehealth: Payer: Self-pay | Admitting: Gerontology

## 2020-03-07 NOTE — Telephone Encounter (Signed)
Pt will bring pay stubs to next appt

## 2020-03-20 ENCOUNTER — Other Ambulatory Visit: Payer: Self-pay

## 2020-03-20 ENCOUNTER — Ambulatory Visit: Payer: Self-pay | Attending: Oncology | Admitting: *Deleted

## 2020-03-20 ENCOUNTER — Encounter: Payer: Self-pay | Admitting: *Deleted

## 2020-03-20 ENCOUNTER — Ambulatory Visit
Admission: RE | Admit: 2020-03-20 | Discharge: 2020-03-20 | Disposition: A | Discharge status: Home / Self Care | Payer: Self-pay | Source: Ambulatory Visit | Attending: Oncology | Admitting: Oncology

## 2020-03-20 VITALS — BP 111/75 | HR 62 | Temp 98.9°F | Ht 63.0 in | Wt 145.0 lb

## 2020-03-20 DIAGNOSIS — Z0000 Encounter for general adult medical examination without abnormal findings: Secondary | ICD-10-CM

## 2020-03-20 NOTE — Patient Instructions (Signed)
Gave patient hand-out, Women Staying Healthy, Active and Well from [facility], with education on breast health, pap smears, heart and colon health. 

## 2020-03-20 NOTE — Progress Notes (Signed)
  Subjective:     Patient ID: Melissa Dean, female   DOB: 1970/09/02, 49 y.o.   MRN: [id number]  HPI   [facility] Medical History Record - 03/20/20 [id number]      Breast History   Screening cycle New    Provider ([facility]) none    Initial Mammogram 03/20/20    Last Mammogram Annual    Last Mammogram Date 03/21/19    Provider (Mammogram)  Delford Field    Recent Breast Symptoms None      Breast Cancer History   Comments/Details patient unsure of biological mother's health history      Previous History of Breast Problems   Breast Surgery or Biopsy Right   x 2 ; benign results   Breast Implants N/A    BSE Done Monthly      Gynecological/Obstetrical History   LMP 03/13/20    Is there any chance that the client could be pregnant?  No    Age at menarche 89    Age at menopause n/a    PAP smear history Annually    Date of last PAP  05/26/17    Provider (PAP) [facility]    Age at first live birth 77    Breast fed children No    DES Exposure No    Cervical, Uterine or Ovarian cancer No    Family history of Cervial, Uterine or Ovarian cancer No    Hysterectomy No    Cervix removed No    Ovaries removed No    Laser/Cryosurgery No    Current method of birth control Other (see comments)   tubal ligation   Current method of Estrogen/Hormone replacement None    Smoking history --   quit smoking 44 days ;            Review of Systems     Objective:   Physical Exam Chest:  Breasts:     Right: No swelling, bleeding, inverted nipple, mass, nipple discharge, skin change, tenderness, axillary adenopathy or supraclavicular adenopathy.     Left: No swelling, bleeding, inverted nipple, mass, nipple discharge, skin change, tenderness, axillary adenopathy or supraclavicular adenopathy.    Lymphadenopathy:     Upper Body:     Right upper body: No supraclavicular or axillary adenopathy.     Left upper body: No supraclavicular or axillary adenopathy.        Assessment:     49 year old female  returns to [facility] for [facility] screening.  Clinical breast exam without dominant mass, skin changes, nipple discharge or lymphadenopathy.  Taught self breast awareness.  Last pap on 05/26/2017 was negative / negative.  Next pap due in 2024.  Patient and I talked about what a joy our granddaughters with Downs Syndrome are to [place].  Patient has been screened for eligibility.  She does not have any insurance, Medicare or Medicaid.  She also meets financial eligibility.   Risk Assessment    Risk Scores      03/20/2020 03/21/2019   Last edited by: Scarlett Presto, RN Jim Like, RN   5-year risk: 1.7 % 1.8 %   Lifetime risk: 12.8 % 13.1 %            Plan:     Screening mammogram ordered.  Will follow up per [facility] protocol.

## 2020-03-26 ENCOUNTER — Encounter: Payer: Self-pay | Admitting: *Deleted

## 2020-03-26 NOTE — Progress Notes (Signed)
Letter mailed from [facility] to inform patient of her normal mammogram results.  Patient is to follow-up with annual screening in one year. 

## 2020-03-31 ENCOUNTER — Emergency Department: Payer: No Typology Code available for payment source

## 2020-03-31 ENCOUNTER — Encounter: Payer: Self-pay | Admitting: Emergency Medicine

## 2020-03-31 ENCOUNTER — Emergency Department
Admission: EM | Admit: 2020-03-31 | Discharge: 2020-03-31 | Disposition: A | Discharge status: Home / Self Care | Payer: No Typology Code available for payment source | Attending: Emergency Medicine | Admitting: Emergency Medicine

## 2020-03-31 ENCOUNTER — Other Ambulatory Visit: Payer: Self-pay

## 2020-03-31 DIAGNOSIS — R0789 Other chest pain: Secondary | ICD-10-CM | POA: Diagnosis not present

## 2020-03-31 DIAGNOSIS — F17210 Nicotine dependence, cigarettes, uncomplicated: Secondary | ICD-10-CM | POA: Insufficient documentation

## 2020-03-31 DIAGNOSIS — J45909 Unspecified asthma, uncomplicated: Secondary | ICD-10-CM | POA: Insufficient documentation

## 2020-03-31 DIAGNOSIS — J449 Chronic obstructive pulmonary disease, unspecified: Secondary | ICD-10-CM | POA: Diagnosis not present

## 2020-03-31 DIAGNOSIS — R0781 Pleurodynia: Secondary | ICD-10-CM | POA: Insufficient documentation

## 2020-03-31 DIAGNOSIS — Y92410 Unspecified street and highway as the place of occurrence of the external cause: Secondary | ICD-10-CM | POA: Insufficient documentation

## 2020-03-31 DIAGNOSIS — Z7951 Long term (current) use of inhaled steroids: Secondary | ICD-10-CM | POA: Diagnosis not present

## 2020-03-31 MED ORDER — KETOROLAC TROMETHAMINE 60 MG/2ML IM SOLN
30.0000 mg | Freq: Once | INTRAMUSCULAR | Status: AC
  Administered 2020-03-31: 30 mg via INTRAMUSCULAR
  Filled 2020-03-31: qty 2

## 2020-03-31 MED ORDER — ACETAMINOPHEN 325 MG PO TABS
650.0000 mg | ORAL_TABLET | Freq: Once | ORAL | Status: AC
  Administered 2020-03-31: 650 mg via ORAL
  Filled 2020-03-31: qty 2

## 2020-03-31 MED ORDER — MELOXICAM 15 MG PO TABS: 15.0000 mg | ORAL_TABLET | Freq: Every day | ORAL | 0 refills | Status: AC

## 2020-03-31 MED ORDER — METHOCARBAMOL 750 MG PO TABS: 750.0000 mg | ORAL_TABLET | Freq: Four times a day (QID) | ORAL | 0 refills | Status: AC | PRN

## 2020-03-31 MED ORDER — METHOCARBAMOL 500 MG PO TABS
750.0000 mg | ORAL_TABLET | Freq: Once | ORAL | Status: AC
  Administered 2020-03-31: 750 mg via ORAL
  Filled 2020-03-31: qty 2
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
## 2020-03-31 NOTE — ED Notes (Signed)
Provided Dc instructions. Verbalized understanding.  
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
## 2020-04-05 ENCOUNTER — Emergency Department
Admission: EM | Admit: 2020-04-05 | Discharge: 2020-04-05 | Disposition: A | Discharge status: Home / Self Care | Payer: No Typology Code available for payment source | Attending: Emergency Medicine | Admitting: Emergency Medicine

## 2020-04-05 ENCOUNTER — Emergency Department: Payer: No Typology Code available for payment source

## 2020-04-05 ENCOUNTER — Other Ambulatory Visit: Payer: Self-pay

## 2020-04-05 DIAGNOSIS — J45909 Unspecified asthma, uncomplicated: Secondary | ICD-10-CM | POA: Diagnosis not present

## 2020-04-05 DIAGNOSIS — J449 Chronic obstructive pulmonary disease, unspecified: Secondary | ICD-10-CM | POA: Diagnosis not present

## 2020-04-05 DIAGNOSIS — Z7951 Long term (current) use of inhaled steroids: Secondary | ICD-10-CM | POA: Insufficient documentation

## 2020-04-05 DIAGNOSIS — F17210 Nicotine dependence, cigarettes, uncomplicated: Secondary | ICD-10-CM | POA: Diagnosis not present

## 2020-04-05 DIAGNOSIS — Z20822 Contact with and (suspected) exposure to covid-19: Secondary | ICD-10-CM | POA: Insufficient documentation

## 2020-04-05 DIAGNOSIS — R42 Dizziness and giddiness: Secondary | ICD-10-CM | POA: Diagnosis not present

## 2020-04-05 DIAGNOSIS — H5712 Ocular pain, left eye: Secondary | ICD-10-CM | POA: Insufficient documentation

## 2020-04-05 DIAGNOSIS — R110 Nausea: Secondary | ICD-10-CM | POA: Insufficient documentation

## 2020-04-05 LAB — COMPREHENSIVE METABOLIC PANEL
ALT: 20 U/L (ref 0–44)
AST: 22 U/L (ref 15–41)
Albumin: 4 g/dL (ref 3.5–5.0)
Alkaline Phosphatase: 61 U/L (ref 38–126)
Anion gap: 10 (ref 5–15)
BUN: 10 mg/dL (ref 6–20)
CO2: 23 mmol/L (ref 22–32)
Calcium: 9.6 mg/dL (ref 8.9–10.3)
Chloride: 106 mmol/L (ref 98–111)
Creatinine, Ser: 0.64 mg/dL (ref 0.44–1.00)
GFR, Estimated: >60 mL/min (ref >60)
Glucose, Bld: 106 mg/dL — ABNORMAL HIGH (ref 70–99)
Potassium: 3.9 mmol/L (ref 3.5–5.1)
Sodium: 139 mmol/L (ref 135–145)
Total Bilirubin: 0.7 mg/dL (ref 0.3–1.2)
Total Protein: 7.2 g/dL (ref 6.5–8.1)

## 2020-04-05 LAB — DIFFERENTIAL
Abs Immature Granulocytes: 0.01 10*3/uL (ref 0.00–0.07)
Basophils Absolute: 0.1 10*3/uL (ref 0.0–0.1)
Basophils Relative: 1 %
Eosinophils Absolute: 0.2 10*3/uL (ref 0.0–0.5)
Eosinophils Relative: 3 %
Immature Granulocytes: 0 %
Lymphocytes Relative: 31 %
Lymphs Abs: 1.7 10*3/uL (ref 0.7–4.0)
Monocytes Absolute: 0.5 10*3/uL (ref 0.1–1.0)
Monocytes Relative: 10 %
Neutro Abs: 3.1 10*3/uL (ref 1.7–7.7)
Neutrophils Relative %: 55 %

## 2020-04-05 LAB — POC URINE PREG, ED: Preg Test, Ur: NEGATIVE

## 2020-04-05 LAB — CBC
HCT: 39 % (ref 36.0–46.0)
Hemoglobin: 13 g/dL (ref 12.0–15.0)
MCH: 31 pg (ref 26.0–34.0)
MCHC: 33.3 g/dL (ref 30.0–36.0)
MCV: 92.9 fL (ref 80.0–100.0)
Platelets: 239 10*3/uL (ref 150–400)
RBC: 4.2 MIL/uL (ref 3.87–5.11)
RDW: 13 % (ref 11.5–15.5)
WBC: 5.6 10*3/uL (ref 4.0–10.5)
nRBC: 0 % (ref 0.0–0.2)

## 2020-04-05 MED ORDER — MECLIZINE HCL 25 MG PO TABS: 25.0000 mg | ORAL_TABLET | Freq: Three times a day (TID) | ORAL | 0 refills | Status: DC | PRN

## 2020-04-05 MED ORDER — MECLIZINE HCL 25 MG PO TABS
25.0000 mg | ORAL_TABLET | Freq: Once | ORAL | Status: AC
  Administered 2020-04-05: 25 mg via ORAL
  Filled 2020-04-05: qty 1

## 2020-04-05 MED ORDER — SODIUM CHLORIDE 0.9 % IV SOLN
1000.0000 mL | Freq: Once | INTRAVENOUS | Status: AC
  Administered 2020-04-05: 1000 mL via INTRAVENOUS

## 2020-04-05 MED ORDER — ONDANSETRON HCL 4 MG/2ML IJ SOLN
4.0000 mg | Freq: Once | INTRAMUSCULAR | Status: AC
  Administered 2020-04-05: 4 mg via INTRAVENOUS
  Filled 2020-04-05: qty 2
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
## 2020-04-05 NOTE — ED Notes (Signed)
Pt verbalized understanding of d/c instructions at this time. Pt denies further questions at this time. Pt son assisting pt to lobby via wheelchair at this time, NAD noted.

## 2020-04-05 NOTE — ED Notes (Signed)
This RN to bedside to place IV and give meds. Pt not present in room.

## 2020-04-05 NOTE — ED Notes (Signed)
See triage note. Pt A&Ox4. In NAD; resp reg/unlabored; skin dry. Holding hand to forehead and grimacing at times.
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
## 2020-04-06 LAB — SARS CORONAVIRUS 2 (TAT 6-24 HRS): SARS Coronavirus 2: NEGATIVE

## 2020-04-18 ENCOUNTER — Ambulatory Visit: Payer: Self-pay

## 2020-06-12 ENCOUNTER — Telehealth: Payer: Self-pay | Admitting: Pharmacist

## 2020-06-12 NOTE — Telephone Encounter (Signed)
Patient failed to provide requested 2022 financial documentation. Unable to determine patient's eligibility status. No additional medication assistance will be provided by [facility] without the required proof of income documentation. Patient notified by letter.  [facility] Administrative Assistant 

## 2020-06-20 ENCOUNTER — Ambulatory Visit: Payer: Self-pay | Admitting: Gerontology

## 2020-06-20 ENCOUNTER — Emergency Department: Payer: Self-pay

## 2020-06-20 ENCOUNTER — Emergency Department
Admission: EM | Admit: 2020-06-20 | Discharge: 2020-06-20 | Disposition: A | Discharge status: Home / Self Care | Payer: Self-pay | Attending: Emergency Medicine | Admitting: Emergency Medicine

## 2020-06-20 ENCOUNTER — Other Ambulatory Visit: Payer: Self-pay

## 2020-06-20 ENCOUNTER — Encounter: Payer: Self-pay | Admitting: Gerontology

## 2020-06-20 VITALS — BP 129/82 | HR 85 | Temp 97.9°F | Resp 16 | Ht 62.0 in | Wt 170.9 lb

## 2020-06-20 DIAGNOSIS — R079 Chest pain, unspecified: Secondary | ICD-10-CM

## 2020-06-20 DIAGNOSIS — J45909 Unspecified asthma, uncomplicated: Secondary | ICD-10-CM | POA: Insufficient documentation

## 2020-06-20 DIAGNOSIS — R0600 Dyspnea, unspecified: Secondary | ICD-10-CM | POA: Insufficient documentation

## 2020-06-20 DIAGNOSIS — F17210 Nicotine dependence, cigarettes, uncomplicated: Secondary | ICD-10-CM | POA: Insufficient documentation

## 2020-06-20 DIAGNOSIS — J449 Chronic obstructive pulmonary disease, unspecified: Secondary | ICD-10-CM | POA: Insufficient documentation

## 2020-06-20 DIAGNOSIS — Z7951 Long term (current) use of inhaled steroids: Secondary | ICD-10-CM | POA: Insufficient documentation

## 2020-06-20 LAB — CBC
HCT: 38.8 % (ref 36.0–46.0)
Hemoglobin: 12.8 g/dL (ref 12.0–15.0)
MCH: 30.3 pg (ref 26.0–34.0)
MCHC: 33 g/dL (ref 30.0–36.0)
MCV: 91.7 fL (ref 80.0–100.0)
Platelets: 214 10*3/uL (ref 150–400)
RBC: 4.23 MIL/uL (ref 3.87–5.11)
RDW: 14.3 % (ref 11.5–15.5)
WBC: 8.2 10*3/uL (ref 4.0–10.5)
nRBC: 0 % (ref 0.0–0.2)

## 2020-06-20 LAB — POC URINE PREG, ED: Preg Test, Ur: NEGATIVE

## 2020-06-20 LAB — BASIC METABOLIC PANEL
Anion gap: 8 (ref 5–15)
BUN: 13 mg/dL (ref 6–20)
CO2: 21 mmol/L — ABNORMAL LOW (ref 22–32)
Calcium: 8.6 mg/dL — ABNORMAL LOW (ref 8.9–10.3)
Chloride: 108 mmol/L (ref 98–111)
Creatinine, Ser: 0.57 mg/dL (ref 0.44–1.00)
GFR, Estimated: >60 mL/min (ref >60)
Glucose, Bld: 121 mg/dL — ABNORMAL HIGH (ref 70–99)
Potassium: 3.7 mmol/L (ref 3.5–5.1)
Sodium: 137 mmol/L (ref 135–145)

## 2020-06-20 LAB — TROPONIN I (HIGH SENSITIVITY)
Troponin I (High Sensitivity): <2 ng/L (ref <18)
Troponin I (High Sensitivity): 3 ng/L (ref <18)

## 2020-06-20 NOTE — Progress Notes (Signed)
Established Patient Office Visit  Subjective:  Patient ID: Melissa Dean, female    DOB: 1970/07/15  Age: 50 y.o. MRN: [id number]  CC: No chief complaint on file.   HPI Melissa Dean  is a 50 y.o. female who  has a past medical history of Anemia, Anxiety, Asthma, Bronchitis, COPD (chronic obstructive pulmonary disease) (HCC), and Depression. She presents today stating that she experienced generalized edema to her legs, arms, and face that started 3 weeks ago but resolved one week ago after taking otc Midol. She states that while she had the generalized edema, she also experienced dyspnea, orthopnea and chest pain. She denies any changes in her diet. Currently, she states that she's beging to feel fluid build up in her body, and also continues to experience constant chest pain, which she describes as stabbing and squeezing sensation. During visit she states that her chest hurts in the mid sternal area and it radiates to her back. She rates pain as 5/10. She denies shortness of breath, dizziness and light headedness.   Past Medical History:  Diagnosis Date  . Anemia   . Anxiety   . Asthma   . Bronchitis   . COPD (chronic obstructive pulmonary disease) (HCC)   . Depression     Past Surgical History:  Procedure Laterality Date  . BREAST BIOPSY Right 05/24/2016   coil marker. PSEUDO-ANGIOMATOUS STROMAL HYPERPLASIA. NEGATIVE FOR ATYPIA  . BREAST BIOPSY Right 06/02/2017   ribbon marker.: COLUMNAR CELL CHANGE WITHMICROCALCIFICATIONS. PSEUDO-ANGIOMATOUS STROMAL HYPERPLASIA  . CESAREAN SECTION    . CESAREAN SECTION    . CHOLECYSTECTOMY    . TONSILLECTOMY    . TUBAL LIGATION      Family History  Problem Relation Age of Onset  . Hypertension Father   . Diabetes Father   . Cancer Father        lung cancer  . Diabetes Brother   . Hypertension Paternal Grandfather   . Diabetes Paternal Grandfather   . Thyroid disease Other   . Multiple sclerosis Brother   . Heart disease  Granddaughter   . Down syndrome Granddaughter   . Breast cancer Neg Hx     Social History   Socioeconomic History  . Marital status: Single    Spouse name: Not on file  . Number of children: Not on file  . Years of education: Not on file  . Highest education level: Not on file  Occupational History  . Not on file  Tobacco Use  . Smoking status: Current Some Day Smoker    Packs/day: 0.25    Years: 20.00    Pack years: 5.00  . Smokeless tobacco: Never Used  . Tobacco comment: said the her last cigarette was 3 days ago and is now using the patch to quit  Vaping Use  . Vaping Use: Never used  Substance and Sexual Activity  . Alcohol use: No    Alcohol/week: 0.0 standard drinks  . Drug use: No  . Sexual activity: Not Currently  Other Topics Concern  . Not on file  Social History Narrative  . Not on file   Social Determinants of Health   Financial Resource Strain: Not on file  Food Insecurity: Not on file  Transportation Needs: Not on file  Physical Activity: Not on file  Stress: Not on file  Social Connections: Not on file  Intimate Partner Violence: Not on file    Outpatient Medications Prior to Visit  Medication Sig Dispense Refill  . albuterol (VENTOLIN HFA)  108 (90 Base) MCG/ACT inhaler Inhale 2-4 puffs by mouth every 4 hours as needed for wheezing, cough, and/or shortness of breath (Patient taking differently: Inhale 2-4 puffs by mouth every 4 hours as needed for wheezing, cough, and/or shortness of breath. PRN-requires 1 X Q2 weeks) 18 g 2  . cetirizine (ZYRTEC) 10 MG tablet Take 1 tablet (10 mg total) by mouth at bedtime. 90 tablet 0  . Fluticasone-Salmeterol (ADVAIR) 250-50 MCG/DOSE AEPB Inhale 1 puff into the lungs 2 (two) times daily. 60 each 2  . ibuprofen (ADVIL) 200 MG tablet Take 800 mg by mouth every 6 (six) hours as needed. Q3-4 days (Patient not taking: Reported on 06/20/2020)    . meclizine (ANTIVERT) 25 MG tablet Take 1 tablet (25 mg total) by mouth 3  (three) times daily as needed for dizziness. (Patient not taking: Reported on 06/20/2020) 20 tablet 0   No facility-administered medications prior to visit.    No Known Allergies  ROS Review of Systems  Constitutional: Negative.   Respiratory: Negative.  Negative for shortness of breath.   Cardiovascular: Positive for chest pain.  Skin: Negative.   Neurological: Negative.       Objective:    Physical Exam HENT:     Head: Normocephalic and atraumatic.  Eyes:     Extraocular Movements: Extraocular movements intact.     Conjunctiva/sclera: Conjunctivae normal.     Pupils: Pupils are equal, round, and reactive to light.  Cardiovascular:     Rate and Rhythm: Normal rate and regular rhythm.     Pulses: Normal pulses.     Heart sounds: Normal heart sounds.  Pulmonary:     Effort: Pulmonary effort is normal.     Breath sounds: Normal breath sounds.  Musculoskeletal:        General: Normal range of motion.  Neurological:     General: No focal deficit present.     Mental Status: She is alert. Mental status is at baseline.  Psychiatric:        Mood and Affect: Mood normal.        Behavior: Behavior normal.        Thought Content: Thought content normal.        Judgment: Judgment normal.     BP 129/82 (BP Location: Left Arm, Patient Position: Sitting, Cuff Size: Small)   Pulse 85   Temp 97.9 F (36.6 C) (Oral)   Resp 16   Ht 5\' 2"  (1.575 m)   Wt 170 lb 14.4 oz (77.5 kg)   LMP 06/06/2020 (Approximate)   SpO2 97%   BMI 31.26 kg/m  Wt Readings from Last 3 Encounters:  06/20/20 170 lb (77.1 kg)  06/20/20 170 lb 14.4 oz (77.5 kg)  04/05/20 145 lb (65.8 kg)   Weight loss encouraged.  Health Maintenance Due  Topic Date Due  . Hepatitis C Screening  Never done  . COVID-19 Vaccine (1) Never done  . TETANUS/TDAP  Never done  . COLONOSCOPY (Pts 45-69yrs Insurance coverage will need to be confirmed)  Never done  . INFLUENZA VACCINE  11/05/2019  . PAP SMEAR-Modifier   05/26/2020    There are no preventive care reminders to display for this patient.  Lab Results  Component Value Date   TSH 1.960 02/08/2020   Lab Results  Component Value Date   WBC 8.2 06/20/2020   HGB 12.8 06/20/2020   HCT 38.8 06/20/2020   MCV 91.7 06/20/2020   PLT 214 06/20/2020   Lab Results  Component  Value Date   NA 137 06/20/2020   K 3.7 06/20/2020   CO2 21 (L) 06/20/2020   GLUCOSE 121 (H) 06/20/2020   BUN 13 06/20/2020   CREATININE 0.57 06/20/2020   BILITOT 0.7 04/05/2020   ALKPHOS 61 04/05/2020   AST 22 04/05/2020   ALT 20 04/05/2020   PROT 7.2 04/05/2020   ALBUMIN 4.0 04/05/2020   CALCIUM 8.6 (L) 06/20/2020   ANIONGAP 8 06/20/2020   Lab Results  Component Value Date   CHOL 134 02/08/2020   Lab Results  Component Value Date   HDL 45 02/08/2020   Lab Results  Component Value Date   LDLCALC 68 02/08/2020   Lab Results  Component Value Date   TRIG 117 02/08/2020   Lab Results  Component Value Date   CHOLHDL 3.0 02/08/2020   Lab Results  Component Value Date   HGBA1C 5.3 07/06/2014      Assessment & Plan:   1. Chest pain, unspecified type - She's being experiencing chest pain for the past 3 weeks, unable to obtain labs, nor EKG. She declined clinic calling EMS, she states that she will go to the ED.     Follow-up: Return in about 13 days (around 07/03/2020), or if symptoms worsen or fail to improve.    Limmie Schoenberg Trellis Paganini, NP

## 2020-06-20 NOTE — ED Provider Notes (Signed)
[facility] Emergency Department Provider Note   ____________________________________________   Event Date/Time   First MD Initiated Contact with Patient 06/20/20 1202     (approximate)  I have reviewed the triage vital signs and the nursing notes.   HISTORY  Chief Complaint Chest Pain    HPI Melissa Dean is a 50 y.o. female with past medical history of asthma, anemia, and anxiety who presents to the ED complaining of chest pain.  Patient reports that for the past week she has been dealing with intermittent stabbing pain in the center of her chest.  It does not seem to be exacerbated or alleviated by anything in particular, can come on at any time.  She does state that she has had some difficulty breathing when lying down and when exerting herself, denies any shortness of breath at rest.  She has noticed "whole body swelling" starting about a week and a half ago.  She reports this has since improved, any swelling in her arms and legs has resolved.  She denies any associated rash with the swelling, has never dealt with anything similar in the past.  She denies any significant cardiac history.        Past Medical History:  Diagnosis Date  . Anemia   . Anxiety   . Asthma   . Bronchitis   . COPD (chronic obstructive pulmonary disease) (HCC)   . Depression     Patient Active Problem List   Diagnosis Date Noted  . Healthcare maintenance 01/25/2018  . Housing or economic circumstance 02/10/2017  . Acute respiratory failure with hypoxia (HCC) 07/04/2016  . COPD (chronic obstructive pulmonary disease) (HCC) 07/04/2016  . Acute bronchitis 07/04/2016  . Anxiety 07/18/2014  . Tobacco abuse 07/18/2014  . Abnormal TSH 07/18/2014  . Atypical chest pain 07/18/2014  . Chest pain 07/06/2014    Past Surgical History:  Procedure Laterality Date  . BREAST BIOPSY Right 05/24/2016   coil marker. PSEUDO-ANGIOMATOUS STROMAL HYPERPLASIA. NEGATIVE FOR ATYPIA  .  BREAST BIOPSY Right 06/02/2017   ribbon marker.: COLUMNAR CELL CHANGE WITHMICROCALCIFICATIONS. PSEUDO-ANGIOMATOUS STROMAL HYPERPLASIA  . CESAREAN SECTION    . CESAREAN SECTION    . CHOLECYSTECTOMY    . TONSILLECTOMY    . TUBAL LIGATION      Prior to Admission medications   Medication Sig Start Date End Date Taking? Authorizing Provider  albuterol (VENTOLIN HFA) 108 (90 Base) MCG/ACT inhaler Inhale 2-4 puffs by mouth every 4 hours as needed for wheezing, cough, and/or shortness of breath Patient taking differently: Inhale 2-4 puffs by mouth every 4 hours as needed for wheezing, cough, and/or shortness of breath. PRN-requires 1 X Q2 weeks 10/19/19   Mike Gip, FNP  cetirizine (ZYRTEC) 10 MG tablet Take 1 tablet (10 mg total) by mouth at bedtime. 02/08/20   Abate, Desta A, NP  Fluticasone-Salmeterol (ADVAIR) 250-50 MCG/DOSE AEPB Inhale 1 puff into the lungs 2 (two) times daily. 01/18/20   Abate, Desta A, NP    Allergies Patient has no known allergies.  Family History  Problem Relation Age of Onset  . Hypertension Father   . Diabetes Father   . Cancer Father        lung cancer  . Diabetes Brother   . Hypertension Paternal Grandfather   . Diabetes Paternal Grandfather   . Thyroid disease Other   . Multiple sclerosis Brother   . Heart disease Granddaughter   . Down syndrome Granddaughter   . Breast cancer Neg Hx  Social History Social History   Tobacco Use  . Smoking status: Current Some Day Smoker    Packs/day: 0.25    Years: 20.00    Pack years: 5.00  . Smokeless tobacco: Never Used  . Tobacco comment: said the her last cigarette was 3 days ago and is now using the patch to quit  Vaping Use  . Vaping Use: Never used  Substance Use Topics  . Alcohol use: No    Alcohol/week: 0.0 standard drinks  . Drug use: No    Review of Systems  Constitutional: No fever/chills Eyes: No visual changes. ENT: No sore throat. Cardiovascular: Positive for chest  pain. Respiratory: Positive for shortness of breath. Gastrointestinal: No abdominal pain.  No nausea, no vomiting.  No diarrhea.  No constipation. Genitourinary: Negative for dysuria. Musculoskeletal: Negative for back pain.  Positive for extremity swelling. Skin: Negative for rash. Neurological: Negative for headaches, focal weakness or numbness.  ____________________________________________   PHYSICAL EXAM:  VITAL SIGNS: ED Triage Vitals  Enc Vitals Group     BP 06/20/20 1123 117/74     Pulse Rate 06/20/20 1123 74     Resp 06/20/20 1123 18     Temp 06/20/20 1123 98.5 F (36.9 C)     Temp Source 06/20/20 1123 Oral     SpO2 06/20/20 1123 99 %     Weight 06/20/20 1120 170 lb (77.1 kg)     Height 06/20/20 1120 5\' 2"  (1.575 m)     Head Circumference --      Peak Flow --      Pain Score 06/20/20 1119 6     Pain Loc --      Pain Edu? --      Excl. in GC? --     Constitutional: Alert and oriented. Eyes: Conjunctivae are normal. Head: Atraumatic. Nose: No congestion/rhinnorhea. Mouth/Throat: Mucous membranes are moist. Neck: Normal ROM Cardiovascular: Normal rate, regular rhythm. Grossly normal heart sounds.  2+ radial pulses bilaterally. Respiratory: Normal respiratory effort.  No retractions. Lungs CTAB. Gastrointestinal: Soft and nontender. No distention. Genitourinary: deferred Musculoskeletal: No lower extremity tenderness nor edema. Neurologic:  Normal speech and language. No gross focal neurologic deficits are appreciated. Skin:  Skin is warm, dry and intact. No rash noted. Psychiatric: Mood and affect are normal. Speech and behavior are normal.  ____________________________________________   LABS (all labs ordered are listed, but only abnormal results are displayed)  Labs Reviewed  BASIC METABOLIC PANEL - Abnormal; Notable for the following components:      Result Value   CO2 21 (*)    Glucose, Bld 121 (*)    Calcium 8.6 (*)    All other components within  normal limits  CBC  POC URINE PREG, ED  TROPONIN I (HIGH SENSITIVITY)  TROPONIN I (HIGH SENSITIVITY)   ____________________________________________  EKG  ED ECG REPORT I, 06/22/20, the attending physician, personally viewed and interpreted this ECG.   Date: 06/20/2020  EKG Time: 11:21  Rate: 73  Rhythm: normal sinus rhythm  Axis: Normal  Intervals:none  ST&T Change: None   PROCEDURES  Procedure(s) performed (including Critical Care):  Procedures   ____________________________________________   INITIAL IMPRESSION / ASSESSMENT AND PLAN / ED COURSE       50 year old female with past medical history of asthma, anemia, and anxiety who presents to the ED complaining of body swelling last week associated with intermittent stabbing chest pain and some dyspnea on exertion.  She is not in any respiratory distress, maintaining  O2 sats on room air and denies difficulty breathing at rest.  Her body swelling seems to have resolved and she has no signs of fluid overload at this time.  Chest x-ray reviewed by me and shows no infiltrate, edema, or effusion.  Low suspicion for new onset CHF and there is no evidence of ACS at this time given unremarkable EKG and troponin.  I doubt PE as she is PERC negative.  We will repeat troponin and if this is negative, patient be appropriate for discharge home with PCP follow-up.  Repeat troponin is negative, chest pain has resolved. Patient is appropriate for discharge home with PCP follow-up, was counseled to return to the ED for new worsening symptoms.  Patient agrees with plan.      ____________________________________________   FINAL CLINICAL IMPRESSION(S) / ED DIAGNOSES  Final diagnoses:  Nonspecific chest pain     ED Discharge Orders    None       Note:  This document was prepared using Dragon voice recognition software and may include unintentional dictation errors.   Chesley Noon, MD 06/20/20 1450

## 2020-06-20 NOTE — ED Triage Notes (Signed)
Centralized squeezing chest pain that started today, reports stabbing chest pain over past week with "body swelling". Reports weight gain of 30lb over last 1.5 months. Reports SOB with lying down and exertion. Pt alert and oriented X4, cooperative, RR even and unlabored, color WNL. Pt in NAD.

## 2020-06-20 NOTE — Patient Instructions (Signed)

## 2020-08-23 ENCOUNTER — Other Ambulatory Visit: Payer: Self-pay

## 2020-09-12 ENCOUNTER — Other Ambulatory Visit: Payer: Self-pay

## 2020-09-13 LAB — CBC
Hematocrit: 39.6 % (ref 34.0–46.6)
Hemoglobin: 12.9 g/dL (ref 11.1–15.9)
MCH: 30 pg (ref 26.6–33.0)
MCHC: 32.6 g/dL (ref 31.5–35.7)
MCV: 92 fL (ref 79–97)
Platelets: 244 10*3/uL (ref 150–450)
RBC: 4.3 x10E6/uL (ref 3.77–5.28)
RDW: 13.2 % (ref 11.7–15.4)
WBC: 7.8 10*3/uL (ref 3.4–10.8)

## 2020-09-13 LAB — COMPREHENSIVE METABOLIC PANEL
ALT: 19 IU/L (ref 0–32)
AST: 20 IU/L (ref 0–40)
Albumin/Globulin Ratio: 1.6 (ref 1.2–2.2)
Albumin: 4 g/dL (ref 3.8–4.8)
Alkaline Phosphatase: 82 IU/L (ref 44–121)
BUN/Creatinine Ratio: 10 (ref 9–23)
BUN: 6 mg/dL (ref 6–24)
Bilirubin Total: 0.2 mg/dL (ref 0.0–1.2)
CO2: 23 mmol/L (ref 20–29)
Calcium: 9.1 mg/dL (ref 8.7–10.2)
Chloride: 104 mmol/L (ref 96–106)
Creatinine, Ser: 0.61 mg/dL (ref 0.57–1.00)
Globulin, Total: 2.5 g/dL (ref 1.5–4.5)
Glucose: 103 mg/dL — ABNORMAL HIGH (ref 65–99)
Potassium: 4.2 mmol/L (ref 3.5–5.2)
Sodium: 141 mmol/L (ref 134–144)
Total Protein: 6.5 g/dL (ref 6.0–8.5)
eGFR: 111 mL/min/{1.73_m2} (ref >59)

## 2020-09-13 LAB — LIPID PANEL WITH LDL/HDL RATIO
Cholesterol, Total: 163 mg/dL (ref 100–199)
HDL: 45 mg/dL (ref >39)
LDL Chol Calc (NIH): 69 mg/dL (ref 0–99)
LDL/HDL Ratio: 1.5 ratio (ref 0.0–3.2)
Triglycerides: 309 mg/dL — ABNORMAL HIGH (ref 0–149)
VLDL Cholesterol Cal: 49 mg/dL — ABNORMAL HIGH (ref 5–40)

## 2020-09-13 LAB — TSH: TSH: 6.62 u[IU]/mL — ABNORMAL HIGH (ref 0.450–4.500)

## 2020-09-18 ENCOUNTER — Other Ambulatory Visit: Payer: Self-pay

## 2020-09-18 ENCOUNTER — Encounter: Payer: Self-pay | Admitting: Gerontology

## 2020-09-18 ENCOUNTER — Ambulatory Visit: Payer: Self-pay | Admitting: Gerontology

## 2020-09-18 VITALS — BP 108/64 | HR 83 | Temp 98.5°F | Resp 16 | Ht 62.0 in | Wt 180.4 lb

## 2020-09-18 DIAGNOSIS — F32A Depression, unspecified: Secondary | ICD-10-CM

## 2020-09-18 DIAGNOSIS — F419 Anxiety disorder, unspecified: Secondary | ICD-10-CM | POA: Insufficient documentation

## 2020-09-18 DIAGNOSIS — R7989 Other specified abnormal findings of blood chemistry: Secondary | ICD-10-CM

## 2020-09-18 DIAGNOSIS — R635 Abnormal weight gain: Secondary | ICD-10-CM | POA: Insufficient documentation

## 2020-09-18 NOTE — Progress Notes (Signed)
Established Patient Office Visit  Subjective:  Patient ID: Melissa Dean, female    DOB: 06/12/1970  Age: 50 y.o. MRN: [id number]  CC:  Chief Complaint  Patient presents with   Follow-up    Labs drawn 09/12/20    HPI Melissa Dean  is a 50 y/o female who  has a past medical history of Anemia, Anxiety, Asthma, Bronchitis, COPD (chronic obstructive pulmonary disease) ([place]), and Depression. presents for lab review and medication refill. Her TSH done on 09/12/20 was 6.620, she denies constipation, heat/cold intolerance but admits to weight gain. She states that she doesn't feel good, because her weight gain and her depression. She gained 10 pounds in 3 months since she quit smoking 8 months ago, and admits to not exercising regularly. She reports experiencing hot flashes at night and it's so overwhelming. She states that her LMP was 2 weeks ago, endorses regular menstrual cycles with intermittent spotting between cycle. Her other labs were non significant. She was seen at the ED on 06/20/20 for Atypical chest pain. Currently, she denies chest pain, palpitation, light headedness and shortness of breath. She states that her mood is dysphoric, denies suicidal nor homicidal ideation. She states that she's looking forward to starting a new job tomorrow. Overall, she states that she's doing, but worried about her weight gain.  Past Medical History:  Diagnosis Date   Anemia    Anxiety    Asthma    Bronchitis    COPD (chronic obstructive pulmonary disease) ([place])    Depression     Past Surgical History:  Procedure Laterality Date   BREAST BIOPSY Right 05/24/2016   coil marker. PSEUDO-ANGIOMATOUS STROMAL HYPERPLASIA. NEGATIVE FOR ATYPIA   BREAST BIOPSY Right 06/02/2017   ribbon marker.: [place]. PSEUDO-ANGIOMATOUS STROMAL HYPERPLASIA   CESAREAN SECTION     CESAREAN SECTION     CHOLECYSTECTOMY     TONSILLECTOMY     TUBAL LIGATION      Family History   Problem Relation Age of Onset   Other Mother        unknown medical history   Kidney disease Father    Hypertension Father    Diabetes Father    Cancer Father        lung cancer   Healthy Daughter    Healthy Son    Healthy Son    Hypertension Paternal Grandfather    Diabetes Paternal Grandfather    Thyroid disease Granddaughter    Heart disease Granddaughter    Down syndrome Granddaughter    Thyroid disease Other    Diabetes Half-Brother    Hypertension Half-Brother    Heart attack Half-Brother 9   Hypertension Half-Brother    Multiple sclerosis Half-Brother    Breast cancer Neg Hx     Social History   Socioeconomic History   Marital status: Single    Spouse name: Not on file   Number of children: Not on file   Years of education: Not on file   Highest education level: Not on file  Occupational History   Not on file  Tobacco Use   Smoking status: Former    Packs/day: 0.25    Years: 20.00    Pack years: 5.00    Types: Cigarettes    Quit date: 02/06/2020    Years since quitting: 0.6   Smokeless tobacco: Never  Vaping Use   Vaping Use: Never used  Substance and Sexual Activity   Alcohol use: No    Alcohol/week: 0.0 standard  drinks   Drug use: No   Sexual activity: Not Currently  Other Topics Concern   Not on file  Social History Narrative   Not on file   Social Determinants of Health   Financial Resource Strain: Not on file  Food Insecurity: No Food Insecurity   Worried About Running Out of Food in the Last Year: Never true   Ran Out of Food in the Last Year: Never true  Transportation Needs: No Transportation Needs   Lack of Transportation (Medical): No   Lack of Transportation (Non-Medical): No  Physical Activity: Not on file  Stress: Not on file  Social Connections: Not on file  Intimate Partner Violence: Not on file    Outpatient Medications Prior to Visit  Medication Sig Dispense Refill   cetirizine (ZYRTEC) 10 MG tablet TAKE ONE TABLET BY  MOUTH AT BEDTIME 90 tablet 0   albuterol (VENTOLIN HFA) 108 (90 Base) MCG/ACT inhaler Inhale 2-4 puffs by mouth every 4 hours as needed for wheezing, cough, and/or shortness of breath (Patient not taking: Reported on 09/18/2020) 18 g 2   Fluticasone-Salmeterol (ADVAIR) 250-50 MCG/DOSE AEPB Inhale 1 puff into the lungs 2 (two) times daily. (Patient not taking: Reported on 09/18/2020) 60 each 2   No facility-administered medications prior to visit.    No Known Allergies  ROS Review of Systems  Constitutional: Negative.   Respiratory: Negative.    Cardiovascular: Negative.   Gastrointestinal: Negative.   Endocrine: Negative.   Skin: Negative.   Neurological: Negative.   Psychiatric/Behavioral:  Positive for dysphoric mood.      Objective:    Physical Exam HENT:     Head: Normocephalic and atraumatic.  Cardiovascular:     Rate and Rhythm: Normal rate and regular rhythm.     Pulses: Normal pulses.     Heart sounds: Normal heart sounds.  Pulmonary:     Effort: Pulmonary effort is normal.     Breath sounds: Normal breath sounds.  Abdominal:     General: Bowel sounds are normal.     Palpations: Abdomen is soft.  Skin:    General: Skin is warm.  Neurological:     General: No focal deficit present.     Mental Status: She is alert and oriented to person, place, and time. Mental status is at baseline.  Psychiatric:        Mood and Affect: Mood normal.        Behavior: Behavior normal.        Thought Content: Thought content normal.        Judgment: Judgment normal.    BP 108/64 (BP Location: Right Arm, Patient Position: Sitting, Cuff Size: Large) Comment: manual  Pulse 83   Temp 98.5 F (36.9 C)   Resp 16   Ht '5\' 2"'  (1.575 m)   Wt 180 lb 6.4 oz (81.8 kg)   LMP 09/09/2020 (Approximate)   SpO2 97%   BMI 33.00 kg/m  Wt Readings from Last 3 Encounters:  09/18/20 180 lb 6.4 oz (81.8 kg)  06/20/20 170 lb (77.1 kg)  06/20/20 170 lb 14.4 oz (77.5 kg)  Weight loss  encouraged   Health Maintenance Due  Topic Date Due   COVID-19 Vaccine (1) Never done   Pneumococcal Vaccine 39-57 Years old (1 - PCV) Never done   Hepatitis C Screening  Never done   TETANUS/TDAP  Never done   Zoster Vaccines- Shingrix (1 of 2) Never done   COLONOSCOPY (Pts 45-13yr Insurance coverage will need  to be confirmed)  Never done   PAP SMEAR-Modifier  05/26/2020    There are no preventive care reminders to display for this patient.  Lab Results  Component Value Date   TSH 6.620 (H) 09/12/2020   Lab Results  Component Value Date   WBC 7.8 09/12/2020   HGB 12.9 09/12/2020   HCT 39.6 09/12/2020   MCV 92 09/12/2020   PLT 244 09/12/2020   Lab Results  Component Value Date   NA 141 09/12/2020   K 4.2 09/12/2020   CO2 23 09/12/2020   GLUCOSE 103 (H) 09/12/2020   BUN 6 09/12/2020   CREATININE 0.61 09/12/2020   BILITOT 0.2 09/12/2020   ALKPHOS 82 09/12/2020   AST 20 09/12/2020   ALT 19 09/12/2020   PROT 6.5 09/12/2020   ALBUMIN 4.0 09/12/2020   CALCIUM 9.1 09/12/2020   ANIONGAP 8 06/20/2020   EGFR 111 09/12/2020   Lab Results  Component Value Date   CHOL 163 09/12/2020   Lab Results  Component Value Date   HDL 45 09/12/2020   Lab Results  Component Value Date   LDLCALC 69 09/12/2020   Lab Results  Component Value Date   TRIG 309 (H) 09/12/2020   Lab Results  Component Value Date   CHOLHDL 3.0 02/08/2020   Lab Results  Component Value Date   HGBA1C 5.3 07/06/2014      Assessment & Plan:   1. Anxiety and depression -She was advised to call the crisis help line with worsening symptoms. She will follow up with [facility] Ms. [name].  2. Weight gain - She was advised on portion sizes and exercise as tolerated. - Vitamin D (25 hydroxy); Future - Vitamin D (25 hydroxy)  3. Abnormal TSH - Her TSH is sub optimal, has no symptoms, will recheck in 3 months. - TSH; Future      Follow-up: Return in about 3 months (around  12/19/2020), or if symptoms worsen or fail to improve.    Minette Manders Jerold Coombe, NP

## 2020-09-18 NOTE — Patient Instructions (Signed)

## 2020-09-19 LAB — VITAMIN D 25 HYDROXY (VIT D DEFICIENCY, FRACTURES): Vit D, 25-Hydroxy: 23.4 ng/mL — ABNORMAL LOW (ref 30.0–100.0)

## 2020-09-24 ENCOUNTER — Institutional Professional Consult (permissible substitution): Payer: Self-pay | Admitting: Licensed Clinical Social Worker

## 2020-09-26 ENCOUNTER — Other Ambulatory Visit: Payer: Self-pay

## 2020-09-26 ENCOUNTER — Ambulatory Visit: Payer: Self-pay | Admitting: Licensed Clinical Social Worker

## 2020-09-26 ENCOUNTER — Institutional Professional Consult (permissible substitution): Payer: Self-pay | Admitting: Licensed Clinical Social Worker

## 2020-09-26 DIAGNOSIS — F419 Anxiety disorder, unspecified: Secondary | ICD-10-CM

## 2020-09-26 DIAGNOSIS — F32A Depression, unspecified: Secondary | ICD-10-CM

## 2020-09-26 NOTE — BH Specialist Note (Signed)
ADULT Comprehensive Clinical Assessment (CCA) Note   09/26/2020 Melissa Dean [id number]   Referring Provider: Hurman Horn, NP of [facility] Session Time:  0800 - 0900 60 minutes.  SUBJECTIVE: Melissa Dean is a 50 y.o.   female accompanied by  herself  Melissa Dean was seen in consultation at the request of Hurman Horn, NP, of [facility] for a mental health assessment.  Types of Service: Comprehensive Clinical Assessment (CCA)  Reason for referral in patient/family's own words:  The patient stated, "my doctor referred me because of my worsening depression and anxiety."     She likes to be called Melissa Dean.  She came to the appointment with  herself .  Primary language at home is [place].  Constitutional Presentation: cooperative, alert and well-sounding  (Patient to answer as appropriate) Gender identity: female Sex assigned at birth: female Pronouns: she, her, hers   Mental status exam:   General Appearance /Behavior:  unable to access due to telephone visit Eye Contact:  unable to access due to telephone visit Motor Behavior:  unable to access due to telephone visit Speech:  Normal Level of Consciousness:  Alert Mood:  Euthymic Affect:  Appropriate Anxiety Level:  Minimal Thought Process:  Coherent Thought Content:  WNL Perception:  Normal Judgment:  Fair Insight:  Present   Current Medications and therapies: She is taking:   Outpatient Encounter Medications as of 09/26/2020  Medication Sig   albuterol (VENTOLIN HFA) 108 (90 Base) MCG/ACT inhaler Inhale 2-4 puffs by mouth every 4 hours as needed for wheezing, cough, and/or shortness of breath (Patient not taking: Reported on 09/18/2020)   cetirizine (ZYRTEC) 10 MG tablet TAKE ONE TABLET BY MOUTH AT BEDTIME   Fluticasone-Salmeterol (ADVAIR) 250-50 MCG/DOSE AEPB Inhale 1 puff into the lungs 2 (two) times daily. (Patient not taking: Reported on 09/18/2020)   No  facility-administered encounter medications on file as of 09/26/2020.     Therapies:   The patient was seen by [facility] in 2012 and was previously prescribed Valium, and Celexa. The patient discontinued the medication because she felt better.  She was seen at [facility] by Carey Bullocks for assessment on 02/09/2017 and for follow up on 03/09/2017.   Family history: Family mental illness:  No known history of anxiety disorder, panic disorder, social anxiety disorder, depression, suicide attempt, suicide completion, bipolar disorder, schizophrenia, eating disorder, personality disorder, OCD, PTSD, ADHD Family school achievement history:  No known history of autism, learning disability, intellectual disability Other relevant family history:  No known history of substance use or alcoholism  Social History: Now living with  son and granddaughter . Employment:   Started new job at [employer] or Spiritual Beliefs: Did not assess  Negative Mood Concerns She does not make negative statements about self. Self-injury:  No Suicidal ideation:  No Suicide attempt:  No  Additional Anxiety Concerns: Panic attacks:  Yes-, Utilizes coping skills such as deep breathing; last panic was a couple years ago. Obsessions:  No Compulsions:  No  Stressors:  Family conflict  Alcohol and/or Substance Use: Have you recently consumed alcohol? no  Have you recently used any drugs?  no  Have you recently consumed any tobacco? no Does patient seem concerned about dependence or abuse of any substance? no  Substance Use Disorder Checklist:  N/A  Severity Risk Scoring based on DSM-5 Criteria for Substance Use Disorder. The presence of at least two (2) criteria in the last 12 months indicate a substance  use disorder. The severity of the substance use disorder is defined as:  Mild: Presence of 2-3 criteria Moderate: Presence of 4-5 criteria Severe: Presence of 6 or more criteria  Traumatic  Experiences: History or current traumatic events (natural disaster, house fire, etc.)? no History or current physical trauma?  no History or current emotional trauma?  no History or current sexual trauma?  no History or current domestic or intimate partner violence?  no History of bullying:  no  Risk Assessment: Suicidal or homicidal thoughts?   no Self injurious behaviors?  no Guns in the home?  no  Self Harm Risk Factors: Family history of suicide and Loss (financial/interpersonal/professional)  Self Harm Thoughts?: No  Patient and/or Family's Strengths/Protective Factors: Concrete supports in place (healthy food, safe environments, etc.), Sense of purpose, and Caregiver has knowledge of parenting & child development  Patient's and/or Family's Goals in their own words: The patient stated, "I want to do something to help with my depression and anxiety."  Interventions: Interventions utilized:   Completed Biopsychosocial Assessment    Patient and/or Family Response: The patient agreed to the case consultation with Dr. Mare Ferrari and noted they would like to start therapy.   Standardized Assessments completed: GAD-7 and PHQ 9 PHQ-9    18 GAD-7    12  Assessment:  Melissa Dean is a 50 y.o. Caucasian female who presents today for a mental health assessments and was referred by Hurman Horn, NP of [facility]. Melissa Dean has been struggling with symptoms of anxiety and depression for many years. She described feeling anxious, experiencing unwanted excessive worrying which impacts her ability to relax daily. She described becoming easily annoyed and irritated several days in a week. She endorsed a history of panic attacks that started after her daughter was born over 20 years ago. However she reported that she is able to utilize deep breathing and other coping skills and has not experienced a panic attack in two years. Melissa Dean endorsed feeling depressed and crying uncontrollably  every day. She described difficulty falling asleep; she noted it took her over two hours to fall asleep. She reported waking 2-4 times a night to urinate. Melissa Dean experiences a lack of energy and feels tired daily. She explained that she has been gaining weight and overeating since she quit smoking seven months ago.  The patient was seen by [facility] in 2012 and was previously prescribed Valium, and Celexa. The patient discontinued the medication because she felt better.  She was seen at [facility] by Carey Bullocks for assessment on 02/09/2017 and for follow up on 03/09/2017. The patient denies history of psychiatric hospitalizations or substance abuse treatment. Further, the patient denied any substance use history. She further denied ever attempting to end her life. She denies any suicidal or homicidal thoughts. The patient's 71 year old uncle ended his life four months ago. The patient denied any family history of mental illness or substance abuse.  Melissa Dean is an established patient at [facility] her last visit with Hurman Horn, NP was on 09/18/2020. She has a past medical history of Anemia, Anxiety, Asthma, Bronchitis, COPD (chronic obstructive pulmonary disease) (HCC),Depression, and abnormal TSH.             Melissa Dean was born in [place] and noted her mother abandoned her when she was 34 months old. She married at age 7 and divorced three years later. She moved to [place] 15 years ago with her second ex-husband and three children.  Her second marriage lasted 9 years. She married a third time and divorced 6 years later. The patient denied any history of abuse or witnessing any traumatic event. She currently lives with her son and 69 y.o. granddaughter who she is a caretaker for. The patient noted she has another granddaughter who she try's to support who has congential heart problems. The patient has worked in [employer] and recently began a new job at [employer]. She is very  close to two cousins who live in [place] and [place] and calls on them for strength and support.   Coordination of Care:  Coordination of Care with   Carey Bullocks, LCSW, Dr. Mare Ferrari, M.D. Psychiatric Consultant, and PCP.   DSM-5 Diagnosis: Major Depressive Disorder moderate and Generalized Anxiety Disorder.  Recommendations for Services/Supports/Treatments:   Dr. Mare Ferrari, MD psychiatric consultant recommends the patient start Trazodone 50 MG qAM and 100 MG at Bedtime along with therapy and medication psychoeducation.  Progress towards Goals: Other  Treatment Plan Summary: Behavioral Health Clinician will: Present for case consultation with Dr. Mare Ferrari, MD psychiatric consultant, and Hurman Horn, NP on 10/01/2020 at 11:00 AM.  Individual will:  Report any worsening symptoms. Be on time for follow-up appointment   Referral(s): [facility] (In Clinic)  Judith Part, [place]

## 2020-10-03 ENCOUNTER — Ambulatory Visit: Payer: Self-pay | Admitting: Licensed Clinical Social Worker

## 2020-10-22 ENCOUNTER — Ambulatory Visit: Payer: Self-pay | Admitting: Licensed Clinical Social Worker

## 2020-10-23 ENCOUNTER — Other Ambulatory Visit: Payer: Self-pay

## 2020-10-23 ENCOUNTER — Ambulatory Visit: Payer: Self-pay | Admitting: Licensed Clinical Social Worker

## 2020-10-23 DIAGNOSIS — F411 Generalized anxiety disorder: Secondary | ICD-10-CM

## 2020-10-23 DIAGNOSIS — F331 Major depressive disorder, recurrent, moderate: Secondary | ICD-10-CM

## 2020-10-23 NOTE — BH Specialist Note (Signed)
Integrated [facility]al [facility] Follow Up In-Person Visit  MRN: [id number] Name: Melissa Dean   Total time: 60 minutes  Types of Service: Telephone visit Patient consents to telephone visit and 2 patient identifiers were used to identify patient   Interpretor:No. Interpretor Name and Language: N/A  Subjective: Melissa Dean is a 50 y.o. female accompanied by  Melissa Dean Patient was referred by Melissa Dean for mental health. Patient reports the following symptoms/concerns: The patient reports that she has been about the same since her last follow up visit She explained that she has not has any panic attacks and that they use to occur ever couple of weeks, but have slowed down to every couple of months. The patient discussed financial stressors impacting her life. She notes that she had applied for several jobs online today because she knows her current position at [employer] is not working out. 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 She reported that she lives in the same area and is concerned about the high level of crime. The patient discussed her 2 y.o. granddaughter who has Down Syndrome will need another heart surgery at some point as she grows. She explained that while she knows this is common she still worries about it. She also discussed her 35 y.o. granddaughter who she is the guardian and caretaker for along with her son. She stated that her son was awarded full custody when the child was 27 months old and she has raised her ever since. She discussed other stressors related to carrying for young children. The patient asked questions regarding the psychiatric case consultation recommendations and about the potential side effects of Trazodone. She explained that when she was on Celexa she felt emotionless and was concerned this medication would cause her to be emotionally numb as well. She stated she was agreeable to the recommendations and requested her  prescription for Trazodone be sent to [facility] pharmacy on [address]The patient denied any suicidal or homicidal thoughts.  Duration of problem: ; Severity of problem: moderate  Objective: Mood: Euthymic and Affect: Appropriate Risk of harm to self or others: No plan to harm self or others  Life Context: Family and Social: see above School/Work: see above Self-Care: see above Life Changes: see above  Patient and/or Family's Strengths/Protective Factors: Concrete supports in place (healthy food, safe environments, etc.)  Goals Addressed: Patient will:  Reduce symptoms of: anxiety, depression, insomnia, and stress   Increase knowledge and/or ability of: coping skills, healthy habits, self-management skills, and stress reduction   Demonstrate ability to: Increase healthy adjustment to current life circumstances  Progress towards Goals: Ongoing  Interventions: Interventions utilized:  CBT Cognitive Behavioral Therapy was utilized by the clinician during today's follow up session. The clinician processed with the patient how they have been doing since the last follow-up session. The clinician provided a space for the patient to ventilate their frustrations regarding their current life circumstances. Clinician measured the patient's anxiety and depression on a numerical scale.  Clinician explained Catastrophizing thoughts are thought distortions and processed with patient to identify these thoughts and examine the probability that they will occur. Clinician utilized guidance and supervision, provided by psychiatric consultant, to inform patient of the benefits and risks of psychotropic medication use including a verbal summary of Black Box warnings. Patient was given the opportunity to ask questions and address concerns regarding the psychiatric consultants recommendations for treatment.  Standardized Assessments completed: GAD-7 and PHQ 9 GAD-7  12 PHQ-9      15  Assessment: Patient currently experiencing see above.   Patient may benefit from see above.  Plan: Follow up with behavioral health clinician on : 11/06/2020 at 8:00 AM  Behavioral recommendations:  Referral(s): [facility] (In Clinic) "From scale of 1-10, how likely are you to follow plan?":   Melissa Dean, LCSWA

## 2020-10-24 ENCOUNTER — Telehealth: Payer: Self-pay | Admitting: Licensed Clinical Social Worker

## 2020-10-24 ENCOUNTER — Other Ambulatory Visit: Payer: Self-pay | Admitting: Gerontology

## 2020-10-24 ENCOUNTER — Other Ambulatory Visit: Payer: Self-pay

## 2020-10-24 DIAGNOSIS — F331 Major depressive disorder, recurrent, moderate: Secondary | ICD-10-CM

## 2020-10-24 MED ORDER — TRAZODONE HCL 100 MG PO TABS: 100.0000 mg | ORAL_TABLET | Freq: Every day | ORAL | 0 refills | Status: DC

## 2020-10-24 MED ORDER — TRAZODONE HCL 50 MG PO TABS: 50.0000 mg | ORAL_TABLET | Freq: Every morning | ORAL | 0 refills | Status: DC

## 2020-10-28 ENCOUNTER — Emergency Department
Admission: EM | Admit: 2020-10-28 | Discharge: 2020-10-28 | Disposition: A | Discharge status: Home / Self Care | Payer: Self-pay | Attending: Emergency Medicine | Admitting: Emergency Medicine

## 2020-10-28 ENCOUNTER — Emergency Department: Payer: Self-pay

## 2020-10-28 ENCOUNTER — Other Ambulatory Visit: Payer: Self-pay

## 2020-10-28 ENCOUNTER — Encounter: Payer: Self-pay | Admitting: Radiology

## 2020-10-28 DIAGNOSIS — J45909 Unspecified asthma, uncomplicated: Secondary | ICD-10-CM | POA: Insufficient documentation

## 2020-10-28 DIAGNOSIS — J449 Chronic obstructive pulmonary disease, unspecified: Secondary | ICD-10-CM | POA: Insufficient documentation

## 2020-10-28 DIAGNOSIS — R609 Edema, unspecified: Secondary | ICD-10-CM | POA: Insufficient documentation

## 2020-10-28 DIAGNOSIS — Z87891 Personal history of nicotine dependence: Secondary | ICD-10-CM | POA: Insufficient documentation

## 2020-10-28 LAB — COMPREHENSIVE METABOLIC PANEL
ALT: 20 U/L (ref 0–44)
AST: 27 U/L (ref 15–41)
Albumin: 4 g/dL (ref 3.5–5.0)
Alkaline Phosphatase: 73 U/L (ref 38–126)
Anion gap: 9 (ref 5–15)
BUN: 13 mg/dL (ref 6–20)
CO2: 22 mmol/L (ref 22–32)
Calcium: 9.2 mg/dL (ref 8.9–10.3)
Chloride: 105 mmol/L (ref 98–111)
Creatinine, Ser: 0.87 mg/dL (ref 0.44–1.00)
GFR, Estimated: >60 mL/min (ref >60)
Glucose, Bld: 101 mg/dL — ABNORMAL HIGH (ref 70–99)
Potassium: 4.2 mmol/L (ref 3.5–5.1)
Sodium: 136 mmol/L (ref 135–145)
Total Bilirubin: 0.6 mg/dL (ref 0.3–1.2)
Total Protein: 7.4 g/dL (ref 6.5–8.1)

## 2020-10-28 LAB — CBC WITH DIFFERENTIAL/PLATELET
Abs Immature Granulocytes: 0.03 10*3/uL (ref 0.00–0.07)
Basophils Absolute: 0.1 10*3/uL (ref 0.0–0.1)
Basophils Relative: 1 %
Eosinophils Absolute: 0.2 10*3/uL (ref 0.0–0.5)
Eosinophils Relative: 2 %
HCT: 34.6 % — ABNORMAL LOW (ref 36.0–46.0)
Hemoglobin: 12 g/dL (ref 12.0–15.0)
Immature Granulocytes: 0 %
Lymphocytes Relative: 36 %
Lymphs Abs: 3.3 10*3/uL (ref 0.7–4.0)
MCH: 30.5 pg (ref 26.0–34.0)
MCHC: 34.7 g/dL (ref 30.0–36.0)
MCV: 88 fL (ref 80.0–100.0)
Monocytes Absolute: 0.8 10*3/uL (ref 0.1–1.0)
Monocytes Relative: 9 %
Neutro Abs: 4.8 10*3/uL (ref 1.7–7.7)
Neutrophils Relative %: 52 %
Platelets: 261 10*3/uL (ref 150–400)
RBC: 3.93 MIL/uL (ref 3.87–5.11)
RDW: 13.4 % (ref 11.5–15.5)
WBC: 9.2 10*3/uL (ref 4.0–10.5)
nRBC: 0 % (ref 0.0–0.2)

## 2020-10-28 LAB — BRAIN NATRIURETIC PEPTIDE: B Natriuretic Peptide: 18.6 pg/mL (ref 0.0–100.0)

## 2020-10-28 NOTE — ED Provider Notes (Addendum)
[facility] Emergency Department Provider Note   ____________________________________________   Event Date/Time   First MD Initiated Contact with Patient 10/28/20 [phone]     (approximate)  I have reviewed the triage vital signs and the nursing notes.   HISTORY  Chief Complaint Edema    HPI Melissa Dean is a 50 y.o. female with below stated past medical history who presents for bilateral lower extremity edema  LOCATION: Bilateral lower extremities DURATION: 5 days TIMING: Improved since onset SEVERITY: Moderate QUALITY: Edema CONTEXT: Patient states this is happened once before.  Patient does note that she just started a oral trazodone prescription. MODIFYING FACTORS: Denies any exacerbating or relieving factors ASSOCIATED SYMPTOMS: Denies   Per medical record review, patient has known history of anxiety/depression, COPD, and tobacco abuse          Past Medical History:  Diagnosis Date   Anemia    Anxiety    Asthma    Bronchitis    COPD (chronic obstructive pulmonary disease) (HCC)    Depression     Patient Active Problem List   Diagnosis Date Noted   Anxiety and depression 09/18/2020   Weight gain 09/18/2020   Healthcare maintenance 01/25/2018   Housing or economic circumstance 02/10/2017   Acute respiratory failure with hypoxia (HCC) 07/04/2016   COPD (chronic obstructive pulmonary disease) (HCC) 07/04/2016   Acute bronchitis 07/04/2016   Anxiety 07/18/2014   Tobacco abuse 07/18/2014   Abnormal TSH 07/18/2014   Atypical chest pain 07/18/2014   Chest pain 07/06/2014    Past Surgical History:  Procedure Laterality Date   BREAST BIOPSY Right 05/24/2016   coil marker. PSEUDO-ANGIOMATOUS STROMAL HYPERPLASIA. NEGATIVE FOR ATYPIA   BREAST BIOPSY Right 06/02/2017   ribbon marker.: COLUMNAR CELL CHANGE WITHMICROCALCIFICATIONS. PSEUDO-ANGIOMATOUS STROMAL HYPERPLASIA   CESAREAN SECTION     CESAREAN SECTION     CHOLECYSTECTOMY      TONSILLECTOMY     TUBAL LIGATION      Prior to Admission medications   Medication Sig Start Date End Date Taking? Authorizing Provider  albuterol (VENTOLIN HFA) 108 (90 Base) MCG/ACT inhaler Inhale 2-4 puffs by mouth every 4 hours as needed for wheezing, cough, and/or shortness of breath Patient not taking: Reported on 09/18/2020 10/19/19   Mike Gip, FNP  cetirizine (ZYRTEC) 10 MG tablet TAKE ONE TABLET BY MOUTH AT BEDTIME 02/08/20 02/07/21  Abate, Desta A, NP  Fluticasone-Salmeterol (ADVAIR) 250-50 MCG/DOSE AEPB Inhale 1 puff into the lungs 2 (two) times daily. Patient not taking: Reported on 09/18/2020 01/18/20   Abate, Desta A, NP  traZODone (DESYREL) 100 MG tablet Take 1 tablet (100 mg total) by mouth at bedtime. 10/24/20   Iloabachie, Chioma E, NP  traZODone (DESYREL) 50 MG tablet Take 1 tablet (50 mg total) by mouth in the morning. 10/24/20   Iloabachie, Chioma E, NP    Allergies Patient has no known allergies.  Family History  Problem Relation Age of Onset   Other Mother        unknown medical history   Kidney disease Father    Hypertension Father    Diabetes Father    Cancer Father        lung cancer   Healthy Daughter    Healthy Son    Healthy Son    Hypertension Paternal Grandfather    Diabetes Paternal Grandfather    Thyroid disease Granddaughter    Heart disease Granddaughter    Down syndrome Granddaughter    Thyroid disease Other  Diabetes Half-Brother    Hypertension Half-Brother    Heart attack Half-Brother 31   Hypertension Half-Brother    Multiple sclerosis Half-Brother    Breast cancer Neg Hx     Social History Social History   Tobacco Use   Smoking status: Former    Packs/day: 0.25    Years: 20.00    Pack years: 5.00    Types: Cigarettes    Quit date: 02/06/2020    Years since quitting: 0.7   Smokeless tobacco: Never  Vaping Use   Vaping Use: Never used  Substance Use Topics   Alcohol use: No    Alcohol/week: 0.0 standard drinks    Drug use: No    Review of Systems Constitutional: No fever/chills Eyes: No visual changes. ENT: No sore throat. Cardiovascular: Denies chest pain. Respiratory: Denies shortness of breath. Gastrointestinal: No abdominal pain.  No nausea, no vomiting.  No diarrhea. Genitourinary: Negative for dysuria. Musculoskeletal: Negative for acute arthralgias Skin: Negative for rash.  Bilateral lower extremity edema Neurological: Negative for headaches, weakness/numbness/paresthesias in any extremity Psychiatric: Negative for suicidal ideation/homicidal ideation   ____________________________________________   PHYSICAL EXAM:  VITAL SIGNS: ED Triage Vitals  Enc Vitals Group     BP 10/28/20 0128 (!) 152/69     Pulse Rate 10/28/20 0128 82     Resp 10/28/20 0128 18     Temp 10/28/20 0128 97.7 F (36.5 C)     Temp Source 10/28/20 0128 Oral     SpO2 10/28/20 0128 99 %     Weight 10/28/20 0127 180 lb 12.4 oz (82 kg)     Height 10/28/20 0127 5\' 2"  (1.575 m)     Head Circumference --      Peak Flow --      Pain Score 10/28/20 0127 0     Pain Loc --      Pain Edu? --      Excl. in GC? --    Constitutional: Alert and oriented. Well appearing and in no acute distress. Eyes: Conjunctivae are normal. PERRL. Head: Atraumatic. Nose: No congestion/rhinnorhea. Mouth/Throat: Mucous membranes are moist. Neck: No stridor Cardiovascular: Grossly normal heart sounds.  Good peripheral circulation.  Minimal bilateral lower extremity edema Respiratory: Normal respiratory effort.  No retractions. Gastrointestinal: Soft and nontender. No distention. Musculoskeletal: No obvious deformities Neurologic:  Normal speech and language. No gross focal neurologic deficits are appreciated. Skin:  Skin is warm and dry. No rash noted. Psychiatric: Mood and affect are normal. Speech and behavior are normal.  ____________________________________________   LABS (all labs ordered are listed, but only abnormal  results are displayed)  Labs Reviewed  CBC WITH DIFFERENTIAL/PLATELET - Abnormal; Notable for the following components:      Result Value   HCT 34.6 (*)    All other components within normal limits  COMPREHENSIVE METABOLIC PANEL - Abnormal; Notable for the following components:   Glucose, Bld 101 (*)    All other components within normal limits  BRAIN NATRIURETIC PEPTIDE  URINALYSIS, COMPLETE (UACMP) WITH MICROSCOPIC  POC URINE PREG, ED   ____________________________________________  EKG  ED ECG REPORT I, 10/30/20, the attending physician, personally viewed and interpreted this ECG.  Date: 10/28/2020 EKG Time: 0137 Rate: 72 Rhythm: normal sinus rhythm QRS Axis: normal Intervals: normal ST/T Wave abnormalities: normal Narrative Interpretation: no evidence of acute ischemia  ____________________________________________  RADIOLOGY  ED MD interpretation: 2 view chest x-ray shows no evidence of acute abnormalities including no pneumonia, pneumothorax, or widened mediastinum  Official radiology report(s): DG Chest 2 View  Result Date: 10/28/2020 CLINICAL DATA:  Bilateral lower extremity swelling. EXAM: CHEST - 2 VIEW COMPARISON:  06/20/2020 FINDINGS: Normal heart size and mediastinal contours. No acute infiltrate or edema. No effusion or pneumothorax. No acute osseous findings. IMPRESSION: Negative chest. Electronically Signed   By: Marnee Spring M.D.   On: 10/28/2020 04:29    ____________________________________________   PROCEDURES  Procedure(s) performed (including Critical Care):  .1-3 Lead EKG Interpretation  Date/Time: 10/28/2020 9:48 AM Performed by: Merwyn Katos, MD Authorized by: Merwyn Katos, MD     Interpretation: normal     ECG rate:  72   ECG rate assessment: normal     Rhythm: sinus rhythm     Ectopy: none     Conduction: normal     ____________________________________________   INITIAL IMPRESSION / ASSESSMENT AND PLAN / ED  COURSE  As part of my medical decision making, I reviewed the following data within the electronic medical record, if available:  Nursing notes reviewed and incorporated, Labs reviewed, EKG interpreted, Old chart reviewed, Radiograph reviewed and Notes from prior ED visits reviewed and incorporated     This-year-old female presents with leg swelling of unclear etiology. DDX includes chronic venous stasis changes, lymphedema, fracture or trauma, MSK pain, and other nonemergent causes of leg swelling. Doubt atypical presentation of CHF or other volume overload states. PE is low on the differential due to normal vital signs without symptoms. Low suspicion for constitutional infection or metabolic derangements.  Plan: basic labs, reassess, likely discharge No signs of acute renal failure, no signs of acute heart failure, concern for possible medication side effect of trazodone  Dispo: The patient has been reexamined and is ready to be discharged.  All diagnostic results have been reviewed and discussed with the patient/family.  Care plan has been outlined and the patient/family understands all current diagnoses, results, and treatment plans.  There are no new complaints, changes, or physical findings at this time.  All questions have been addressed and answered.  Patient was instructed to, and agrees to follow-up with their primary care physician as well as return to the emergency department if any new or worsening symptoms develop.  Clinical Course as of 10/28/20 0948  Mon Oct 28, 2020  0732 DG Chest 2 View [EB]    Clinical Course User Index [EB] Merwyn Katos, MD     ____________________________________________   FINAL CLINICAL IMPRESSION(S) / ED DIAGNOSES  Final diagnoses:  Peripheral edema     ED Discharge Orders     None        Note:  This document was prepared using Dragon voice recognition software and may include unintentional dictation errors.    Merwyn Katos,  MD 10/28/20 5400    Merwyn Katos, MD 10/28/20 [phone]

## 2020-10-28 NOTE — ED Triage Notes (Addendum)
Patient reports swelling to BLE to waist x several days. Patient reports "my skin feels tight." Patient reports hx of similar with unknown origin. Patient denies other symptoms. Patient denies CHF or hx of diuretic use.

## 2020-10-28 NOTE — ED Notes (Addendum)
Says her joints feel stiff and her legs feel tight and swollen.  Says this happened before in ?March.  Her doctor sent here here, but they did not find anything--then it got better.   This time it started about nnon yesterday. Says she even feels swollen in her armpits.  No sob

## 2020-10-29 ENCOUNTER — Other Ambulatory Visit: Payer: Self-pay | Admitting: Gerontology

## 2020-10-29 ENCOUNTER — Telehealth: Payer: Self-pay | Admitting: Gerontology

## 2020-10-29 ENCOUNTER — Telehealth: Payer: Self-pay | Admitting: Licensed Clinical Social Worker

## 2020-10-29 DIAGNOSIS — G4700 Insomnia, unspecified: Secondary | ICD-10-CM

## 2020-10-29 MED ORDER — MIRTAZAPINE 7.5 MG PO TABS: 7.5000 mg | ORAL_TABLET | Freq: Every day | ORAL | 0 refills | Status: DC

## 2020-10-29 NOTE — Telephone Encounter (Signed)
Pt was contacted and an appt was made for 11/05/2020 at 9 am.

## 2020-10-29 NOTE — Telephone Encounter (Signed)
-----   Message from Rolm Gala, NP sent at 10/29/2020  8:38 AM EDT ----- Pls schedule an in clinic appointment for Ms. Castrejon in 2 weeks. Thank you

## 2020-10-29 NOTE — Telephone Encounter (Signed)
Returned the patient's call to discuss the case consultation and recommendations regarding her medication; no answer left a message with the clinic contact info and explained she may call the [facility] or speak to her pharmacist if she has any further questions .

## 2020-11-05 ENCOUNTER — Telehealth: Payer: Self-pay

## 2020-11-05 NOTE — Telephone Encounter (Signed)
Social worker called patient and rescheduled her appointment with the LCSWA. Patient mentioned that the new medication she was prescribed make it hard for her to fall asleep unlike the previous ones she was on.  Social worker assured patient that she would let the LCSWA know about her experience with her new medication.

## 2020-11-06 ENCOUNTER — Ambulatory Visit: Payer: Self-pay | Admitting: Licensed Clinical Social Worker

## 2020-11-12 ENCOUNTER — Ambulatory Visit: Payer: Self-pay | Admitting: Gerontology

## 2020-11-12 ENCOUNTER — Other Ambulatory Visit: Payer: Self-pay

## 2020-11-12 ENCOUNTER — Encounter: Payer: Self-pay | Admitting: Gerontology

## 2020-11-12 DIAGNOSIS — Z87898 Personal history of other specified conditions: Secondary | ICD-10-CM | POA: Insufficient documentation

## 2020-11-12 NOTE — Progress Notes (Signed)
Established Patient Office Visit  Subjective:  Patient ID: Melissa Dean, female    DOB: 1970-07-23  Age: 50 y.o. MRN: [id number]  CC:  Chief Complaint  Patient presents with   Follow-up    Thyroid lab results   Hospitalization Follow-up    Patient seen at [facility] 11/09/20 due to leg swelling.     HPI Melissa Dean is a 50 year old female who has history of anemia, anxiety, asthma, bronchitis, COPD, depression presents for follow up after emergency room visit. She was seen at [facility] ED for evaluation of leg swelling on 11/10/20 and no acute abnormality was found. They recommended wearing compression stockings , elevating her leg  while sitting down, and checking her for Lupus. She has also been to [facility] ED on 10/28/20 for Peripheral edema , and her Trazodone was discontinue due to possible side effect of Edema. Her labs done during ED visit was within normal limits. Currently, she denies leg swelling, chest pain, palpitation and shortness of breath. Overall, she states that she's doing well, ut concerned about her intermittent leg swelling.   Past Medical History:  Diagnosis Date   Anemia    Anxiety    Asthma    Bronchitis    COPD (chronic obstructive pulmonary disease) ([place])    Depression     Past Surgical History:  Procedure Laterality Date   BREAST BIOPSY Right 05/24/2016   coil marker. PSEUDO-ANGIOMATOUS STROMAL HYPERPLASIA. NEGATIVE FOR ATYPIA   BREAST BIOPSY Right 06/02/2017   ribbon marker.: [place]. PSEUDO-ANGIOMATOUS STROMAL HYPERPLASIA   CESAREAN SECTION     CESAREAN SECTION     CHOLECYSTECTOMY     TONSILLECTOMY     TUBAL LIGATION      Family History  Problem Relation Age of Onset   Other Mother        unknown medical history   Kidney disease Father    Hypertension Father    Diabetes Father    Cancer Father        lung cancer   Healthy Daughter    Healthy Son    Healthy Son    Hypertension Paternal Grandfather     Diabetes Paternal Grandfather    Thyroid disease Granddaughter    Heart disease Granddaughter    Down syndrome Granddaughter    Thyroid disease Other    Diabetes Half-Brother    Hypertension Half-Brother    Heart attack Half-Brother 8   Hypertension Half-Brother    Multiple sclerosis Half-Brother    Breast cancer Neg Hx     Social History   Socioeconomic History   Marital status: Single    Spouse name: Not on file   Number of children: Not on file   Years of education: Not on file   Highest education level: Not on file  Occupational History   Not on file  Tobacco Use   Smoking status: Former    Packs/day: 0.25    Years: 20.00    Pack years: 5.00    Types: Cigarettes    Quit date: 02/06/2020    Years since quitting: 0.7   Smokeless tobacco: Never  Vaping Use   Vaping Use: Never used  Substance and Sexual Activity   Alcohol use: No    Alcohol/week: 0.0 standard drinks   Drug use: No   Sexual activity: Not Currently  Other Topics Concern   Not on file  Social History Narrative   Not on file   Social Determinants of Radio broadcast assistant  Strain: Not on file  Food Insecurity: No Food Insecurity   Worried About Charity fundraiser in the Last Year: Never true   Ran Out of Food in the Last Year: Never true  Transportation Needs: No Transportation Needs   Lack of Transportation (Medical): No   Lack of Transportation (Non-Medical): No  Physical Activity: Not on file  Stress: Not on file  Social Connections: Not on file  Intimate Partner Violence: Not on file    Outpatient Medications Prior to Visit  Medication Sig Dispense Refill   cetirizine (ZYRTEC) 10 MG tablet TAKE ONE TABLET BY MOUTH AT BEDTIME 90 tablet 0   mirtazapine (REMERON) 7.5 MG tablet Take 1 tablet (7.5 mg total) by mouth at bedtime. 30 tablet 0   albuterol (VENTOLIN HFA) 108 (90 Base) MCG/ACT inhaler Inhale 2-4 puffs by mouth every 4 hours as needed for wheezing, cough, and/or shortness of  breath (Patient not taking: No sig reported) 18 g 2   Fluticasone-Salmeterol (ADVAIR) 250-50 MCG/DOSE AEPB Inhale 1 puff into the lungs 2 (two) times daily. (Patient not taking: No sig reported) 60 each 2   No facility-administered medications prior to visit.    No Known Allergies  ROS Review of Systems  Constitutional: Negative.   Respiratory: Negative.    Cardiovascular: Negative.   Endocrine: Negative.   Neurological: Negative.   Psychiatric/Behavioral: Negative.       Objective:    Physical Exam HENT:     Head: Normocephalic and atraumatic.     Mouth/Throat:     Mouth: Mucous membranes are moist.  Cardiovascular:     Rate and Rhythm: Normal rate and regular rhythm.     Pulses: Normal pulses.     Heart sounds: Normal heart sounds.  Pulmonary:     Effort: Pulmonary effort is normal.     Breath sounds: Normal breath sounds.  Musculoskeletal:        General: No swelling.     Right lower leg: No edema.     Left lower leg: No edema.  Skin:    General: Skin is warm.  Neurological:     General: No focal deficit present.     Mental Status: She is alert and oriented to person, place, and time. Mental status is at baseline.    BP 108/73 (BP Location: Right Arm, Patient Position: Sitting, Cuff Size: Large)   Pulse 68   Temp 98.1 F (36.7 C)   Resp 16   Ht '5\' 2"'  (1.575 m)   Wt 185 lb 9.6 oz (84.2 kg)   LMP 11/05/2020 (Approximate)   SpO2 97%   BMI 33.95 kg/m  Wt Readings from Last 3 Encounters:  11/12/20 185 lb 9.6 oz (84.2 kg)  10/28/20 180 lb 12.4 oz (82 kg)  09/18/20 180 lb 6.4 oz (81.8 kg)   Encouraged weight loss  Health Maintenance Due  Topic Date Due   COVID-19 Vaccine (1) Never done   Pneumococcal Vaccine 71-81 Years old (1 - PCV) Never done   Hepatitis C Screening  Never done   TETANUS/TDAP  Never done   COLONOSCOPY (Pts 45-29yr Insurance coverage will need to be confirmed)  Never done   PAP SMEAR-Modifier  05/26/2020   INFLUENZA VACCINE   11/04/2020    There are no preventive care reminders to display for this patient.  Lab Results  Component Value Date   TSH 6.620 (H) 09/12/2020   Lab Results  Component Value Date   WBC 9.2 10/28/2020   HGB  12.0 10/28/2020   HCT 34.6 (L) 10/28/2020   MCV 88.0 10/28/2020   PLT 261 10/28/2020   Lab Results  Component Value Date   NA 136 10/28/2020   K 4.2 10/28/2020   CO2 22 10/28/2020   GLUCOSE 101 (H) 10/28/2020   BUN 13 10/28/2020   CREATININE 0.87 10/28/2020   BILITOT 0.6 10/28/2020   ALKPHOS 73 10/28/2020   AST 27 10/28/2020   ALT 20 10/28/2020   PROT 7.4 10/28/2020   ALBUMIN 4.0 10/28/2020   CALCIUM 9.2 10/28/2020   ANIONGAP 9 10/28/2020   EGFR 111 09/12/2020   Lab Results  Component Value Date   CHOL 163 09/12/2020   Lab Results  Component Value Date   HDL 45 09/12/2020   Lab Results  Component Value Date   LDLCALC 69 09/12/2020   Lab Results  Component Value Date   TRIG 309 (H) 09/12/2020   Lab Results  Component Value Date   CHOLHDL 3.0 02/08/2020   Lab Results  Component Value Date   HGBA1C 5.3 07/06/2014      Assessment & Plan:     1. History of edema - During her ED visit on 10/28/20, Chemistry, CBC and BNP were within normal limits. Will check the following Labs screening for Lupus though no malar rash was observed. She was advised to wear compression stockings, elevate legs while sitting down. She was advised to go to the ED for worsening symptoms. Sedimentation rate; Future - C-reactive protein; Future - Antinuclear Antib (ANA); Future - APTT; Future     Follow-up: Return in about 23 days (around 12/05/2020), or if symptoms worsen or fail to improve.    Disaya Walt Jerold Coombe, NP

## 2020-11-13 ENCOUNTER — Ambulatory Visit: Payer: Self-pay | Admitting: Licensed Clinical Social Worker

## 2020-11-13 DIAGNOSIS — G4700 Insomnia, unspecified: Secondary | ICD-10-CM

## 2020-11-13 DIAGNOSIS — F411 Generalized anxiety disorder: Secondary | ICD-10-CM

## 2020-11-13 DIAGNOSIS — F331 Major depressive disorder, recurrent, moderate: Secondary | ICD-10-CM

## 2020-11-13 NOTE — BH Specialist Note (Signed)
Integrated Behavioral Health Follow Up In-Person Visit  MRN: [id number] Name: Melissa Dean  Total time: 60 minutes  Types of Service: Telephone visit Patient consents to telephone visit and 2 patient identifiers were used to identify patient   Interpretor:No. Interpretor Name and Language: NA  Subjective: Melissa Dean is a 50 y.o. female accompanied by  herself Patient was referred by Hurman Horn, NP for mental health. Patient reports the following symptoms/concerns: The patient noted that she has been doing a little bit worse since her last follow-up appointment. She explained that since she switched to Mirtazapine 7.5 MG at bedtime she is not able to fall asleep. She explained that she goes to bed and lays there for hours before she can fall asleep. She noted that once asleep she stays asleep, and averages between four and five hours of total sleep per night. She stated that she is exhausted when she wakes up and has very low energy levels throughout her day. She explained that she does not nap and tries not to eat before she goes to bed. She shared that she does not have a set bedtime routine and does not wake up at the same time daily. 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 She discussed familial and health stressors impacting her life. She noted that she is feeling increased anxiety regarding her daughter leaving for college in six days. Overall, she noted that she is doing okay and is using her coping kills to deal with her anxiety and has not had a panic attack in the last two weeks. The patient denied suicidal or homicidal thoughts.  Duration of problem: Years; Severity of problem: moderate  Objective: Mood: Euthymic and Affect: Appropriate Risk of harm to self or others: No plan to harm self or others  Life Context: Family and Social: see above School/Work: see above Self-Care: see above Life Changes: see  above  Patient and/or Family's Strengths/Protective Factors: Concrete supports in place (healthy food, safe environments, etc.)  Goals Addressed: Patient will:  Reduce symptoms of: anxiety, depression, insomnia, and stress   Increase knowledge and/or ability of: coping skills, healthy habits, self-management skills, and stress reduction   Demonstrate ability to: Increase healthy adjustment to current life circumstances  Progress towards Goals: Ongoing  Interventions: Interventions utilized:  CBT Cognitive Behavioral Therapy and Sleep Hygienewas utilized by the clinician during today's follow up session. The clinician processed with the patient how they have been doing since the last follow-up session. The clinician provided a space for the patient to ventilate their frustrations regarding their current life circumstances. Clinician measured the patient's anxiety and depression on a numerical scale.  Clinician encouraged the patient to take their mediation at the same time everyday exactly as prescribed for it to reach it's full intended effect.   Clinician assessed the patients pattern of sleep, bedtime routines, activities associated with the bed, activity and energy level while awake, night time snacking, stimulant use, daytime napping, total sleep amounts. Clinician explored the patients thoughts and associated emotions regarding sleep.  Standardized Assessments completed: GAD-7 and PHQ 9 GAD-7       10 PHQ-9       14  Assessment: Patient currently experiencing see above.   Patient may benefit from see above.  Plan: Follow up with behavioral health clinician on : 11/28/2020 at 9:00 AM  Behavioral recommendations:  Referral(s): [facility] (In Clinic) "From scale of 1-10, how likely  are you to follow plan?":   Melissa Dean, LCSWA

## 2020-11-27 ENCOUNTER — Other Ambulatory Visit: Payer: Self-pay

## 2020-11-28 ENCOUNTER — Other Ambulatory Visit: Payer: Self-pay

## 2020-11-28 ENCOUNTER — Ambulatory Visit: Payer: Self-pay | Admitting: Licensed Clinical Social Worker

## 2020-11-28 DIAGNOSIS — F331 Major depressive disorder, recurrent, moderate: Secondary | ICD-10-CM

## 2020-11-28 DIAGNOSIS — F411 Generalized anxiety disorder: Secondary | ICD-10-CM

## 2020-11-28 DIAGNOSIS — G4700 Insomnia, unspecified: Secondary | ICD-10-CM

## 2020-11-28 NOTE — BH Specialist Note (Signed)
Integrated Behavioral Health Follow Up Telephone Visit  MRN: [id number] Name: Melissa Dean   Total time: 30 minutes  Types of Service: Telephone visit Patient consents to telephone visit and 2 patient identifiers were used to identify patient   Interpretor:No. Interpretor Name and Language: N/A  Subjective: Melissa Dean is a 50 y.o. female accompanied by  herself Patient was referred by Hurman Horn, NP for Mental Health. Patient reports the following symptoms/concerns: The patient noted that she has been doing okay since her last follow-up appointment. She explained that he went to help her daughter move into her dorm for her freshman year of college on Wednesday. She shared that  this was emotionally very difficult letting go of her baby girl. However she stated the next day she became sick with COVID. She shared that although she is feeling better she is still very tired and has some lingering symptoms. She shared that prior to COVID she was enjoying less stress on her job and felt her anxiety and depression symptoms have been improving, but she was still struggling to fall asleep. Timesha discussed other familial and financial stressors impacting her life. The patient  denied any suicidal or homicidal thoughts.  Duration of problem: Years; Severity of problem: moderate  Objective: Mood: Euthymic and Affect: Appropriate Risk of harm to self or others: No plan to harm self or others  Life Context: Family and Social: see above School/Work: see above Self-Care: see above Life Changes: see above  Patient and/or Family's Strengths/Protective Factors: Concrete supports in place (healthy food, safe environments, etc.)  Goals Addressed: Patient will:  Reduce symptoms of: anxiety, depression, insomnia, and stress   Increase knowledge and/or ability of: coping skills, healthy habits, self-management skills, and stress reduction   Demonstrate ability to: Increase healthy  adjustment to current life circumstances  Progress towards Goals: Ongoing  Interventions: Interventions utilized:  Supportive Counseling was utilized by the clinician during today's follow up session. The clinician processed with the patient how they have been doing since the last follow-up session. The clinician provided a space for the patient to ventilate their frustrations regarding their current life circumstances. Clinician encouraged the patient to contact her primary care for support with her physical COVID symptoms.  Standardized Assessments completed: Patient declined screening *Patient has Covid does not feel up to completing screenings today.  Assessment: Patient currently experiencing see above.   Patient may benefit from see above.  Plan: Follow up with behavioral health clinician on : 12/12/2020 at 9:00 AM  Behavioral recommendations:  Referral(s): [facility] [employer] (In Clinic) "From scale of 1-10, how likely are you to follow plan?":   Judith Part, LCSWA

## 2020-12-03 ENCOUNTER — Other Ambulatory Visit: Payer: Self-pay | Admitting: Gerontology

## 2020-12-03 DIAGNOSIS — G4700 Insomnia, unspecified: Secondary | ICD-10-CM

## 2020-12-03 MED ORDER — MIRTAZAPINE 7.5 MG PO TABS: 7.5000 mg | ORAL_TABLET | Freq: Every day | ORAL | 0 refills | Status: DC

## 2020-12-05 ENCOUNTER — Ambulatory Visit: Payer: Self-pay | Admitting: Gerontology

## 2020-12-11 ENCOUNTER — Other Ambulatory Visit: Payer: Self-pay

## 2020-12-11 DIAGNOSIS — Z87898 Personal history of other specified conditions: Secondary | ICD-10-CM

## 2020-12-11 DIAGNOSIS — R7989 Other specified abnormal findings of blood chemistry: Secondary | ICD-10-CM

## 2020-12-12 ENCOUNTER — Ambulatory Visit: Payer: Self-pay | Admitting: Licensed Clinical Social Worker

## 2020-12-12 DIAGNOSIS — F331 Major depressive disorder, recurrent, moderate: Secondary | ICD-10-CM

## 2020-12-12 DIAGNOSIS — F411 Generalized anxiety disorder: Secondary | ICD-10-CM

## 2020-12-12 LAB — APTT: aPTT: 31 s (ref 24–33)

## 2020-12-12 NOTE — BH Specialist Note (Signed)
Integrated Behavioral Health Follow Up Telephone Visit  MRN: [id number] Name: Melissa Dean   Total time: 60 minutes  Types of Service: Telephone visit Patient consents to telephone visit and 2 patient identifiers were used to identify patient   Interpretor:No. Interpretor Name and Language: N/A  Subjective: Melissa Dean is a 50 y.o. female accompanied by  herself Patient was referred by Hurman Horn for mental health. Patient reports the following symptoms/concerns: The patient reports that she has been doing very well since her last follow up appointment. She noted that she is recovering from COVID and is starting to regain her smell although he taste is still effected and she has a slight lingering cough. She noted that she is not worrying about her children or having thoughts of something bad happening to them. She shared that she is able to relax more and feels more energetic overall. She shared that she has not experienced a panic attack or felt nervous and has not felt down or depressed at all recently. She shared that everything in her life is going well and the biggest stressor in her life is work and financial concerns. [name] explained that she works second shift and usually goes to bed at 1:00 AM and falls asleep quickly. She wakes up around 9:00 AM to care for her granddaughter. The patient stated that she does not have much time for self care and only has one day a week off from work. She noted that she hopes to have better hours in the future. The patient denied any suicidal or homicidal thoughts.  Duration of problem: Years; Severity of problem: moderate  Objective: Mood: Euthymic and Affect: Appropriate Risk of harm to self or others: No plan to harm self or others  Life Context: Family and Social: see above School/Work: see above Self-Care: see above Life Changes: see above  Patient and/or Family's Strengths/Protective Factors: Concrete supports in place  (healthy food, safe environments, etc.)  Goals Addressed: Patient will:  Reduce symptoms of: anxiety, depression, insomnia, and stress   Increase knowledge and/or ability of: coping skills, healthy habits, self-management skills, and stress reduction   Demonstrate ability to: Increase healthy adjustment to current life circumstances and Increase adequate support systems for patient/family  Progress towards Goals: Ongoing  Interventions: Interventions utilized:  CBT Cognitive Behavioral Therapy was utilized by the clinician during today's follow up session. The clinician processed with the patient how they have been doing since the last follow-up session. The clinician provided a space for the patient to ventilate their frustrations regarding their current life circumstances. Clinician collaborated with the patient to identify needs related to stressors and functioning and assessed and monitored for signs and symptoms of depression and anxiety and assess safety. Clinician measured the patient's anxiety and depression on a numerical scale. Clinician congratulated the patient for doing so well and encouraged her to continue to use her coping skills to deal with her current life circumstances.    Standardized Assessments completed: GAD-7 and PHQ 9 GAD-7    4 PHQ-9    1  Assessment: Patient currently experiencing see above.   Patient may benefit from see above.  Plan: Follow up with behavioral health clinician on : 12/26/20 at 9:00 AM  Behavioral recommendations:  Referral(s): [facility] [employer] (In Clinic) "From scale of 1-10, how likely are you to follow plan?":   Judith Part, LCSWA

## 2020-12-13 LAB — ANA: Anti Nuclear Antibody (ANA): NEGATIVE

## 2020-12-13 LAB — C-REACTIVE PROTEIN: CRP: 4 mg/L (ref 0–10)

## 2020-12-13 LAB — SEDIMENTATION RATE: Sed Rate: 23 mm/hr (ref 0–32)

## 2020-12-13 LAB — TSH: TSH: 6.09 u[IU]/mL — ABNORMAL HIGH (ref 0.450–4.500)

## 2020-12-19 ENCOUNTER — Ambulatory Visit: Payer: Self-pay | Admitting: Family Medicine

## 2020-12-19 ENCOUNTER — Other Ambulatory Visit: Payer: Self-pay

## 2020-12-19 ENCOUNTER — Encounter: Payer: Self-pay | Admitting: Gerontology

## 2020-12-19 VITALS — BP 102/69 | HR 78 | Temp 98.4°F | Resp 16 | Ht 62.0 in | Wt 189.9 lb

## 2020-12-19 DIAGNOSIS — R7989 Other specified abnormal findings of blood chemistry: Secondary | ICD-10-CM

## 2020-12-19 DIAGNOSIS — Z720 Tobacco use: Secondary | ICD-10-CM

## 2020-12-19 DIAGNOSIS — R635 Abnormal weight gain: Secondary | ICD-10-CM

## 2020-12-19 DIAGNOSIS — R7309 Other abnormal glucose: Secondary | ICD-10-CM

## 2020-12-19 DIAGNOSIS — J449 Chronic obstructive pulmonary disease, unspecified: Secondary | ICD-10-CM

## 2020-12-19 DIAGNOSIS — Z87898 Personal history of other specified conditions: Secondary | ICD-10-CM

## 2020-12-19 NOTE — Progress Notes (Signed)
Established Patient Office Visit  Subjective:  Patient ID: Melissa Dean, female    DOB: 06/12/1970  Age: 50 y.o. MRN: [id number]  CC:  Chief Complaint  Patient presents with   Follow-up    Lab drawn on 12/11/20    HPI Melissa Dean is a 50 year old female who has history of anemia, anxiety, asthma, bronchitis, COPD, depression.  Recent history: Seen by mental health on 12/12/2020 and doing well overall.   Seen 11/12/2020 for fu after ED visit for leg swelling.  Work up in the ED normal but recommendation made to check for Lupus. Auto-immune labs at that time normal but TSH does continue to be elevated.  T4 has not been checked.    Elevated TSH: Pt does continue to have elevated TSH.  No T4 checked at that time.  Pt has been seen several times for LE edema and that can be related to hypothyroidism.  Pt also complaining of fatigue, weight gain, usually feels cold but not new.    COPD: currently on advair and PRN albuterol.  Quit smoking in November and since then has not bee using the advair as she does not feel like she needs it..  Only using albuterol when visiting rabbits.  COVID last month and felt terrible but did not have to go the hospital.  Fully vaccinated.    Weight gain: Pt states she has gained about 60 lbs since quitting.  PT reports a lot of discomfort with this weight.  Pt has been trying to eat more veggies and fruit.  Pt also had implants done in November and for a short period of time was unable to eat very well.   Edema: Pt reports the edema is occurring almost nightly.  Did recently switch jobs to home instead and will hopefully not be on her feet as much.  Compression stockings are helpful.  Will take midal if she catches it early.       Health Maintenance:  Pap: last in 05/2017- negative for hpv and NILM- repeat in 5 years (2024)  Mammogram: [facility] 1 in 03/2021- has history of 2 biopsies.  Needs flu shot  Needs PNA vaccine  Needs TDAP    Past Medical History:   Diagnosis Date   Anemia    Anxiety    Asthma    Bronchitis    COPD (chronic obstructive pulmonary disease) ([place])    Depression     Past Surgical History:  Procedure Laterality Date   BREAST BIOPSY Right 05/24/2016   coil marker. PSEUDO-ANGIOMATOUS STROMAL HYPERPLASIA. NEGATIVE FOR ATYPIA   BREAST BIOPSY Right 06/02/2017   ribbon marker.: [place]. PSEUDO-ANGIOMATOUS STROMAL HYPERPLASIA   CESAREAN SECTION     CESAREAN SECTION     CHOLECYSTECTOMY     TONSILLECTOMY     TUBAL LIGATION      Family History  Problem Relation Age of Onset   Other Mother        unknown medical history   Kidney disease Father    Hypertension Father    Diabetes Father    Cancer Father        lung cancer   Healthy Daughter    Healthy Son    Healthy Son    Hypertension Paternal Grandfather    Diabetes Paternal Grandfather    Thyroid disease Granddaughter    Heart disease Granddaughter    Down syndrome Granddaughter    Thyroid disease Other    Diabetes Half-Brother    Hypertension Half-Brother  Heart attack Half-Brother 40   Hypertension Half-Brother    Multiple sclerosis Half-Brother    Breast cancer Neg Hx     Social History   Socioeconomic History   Marital status: Single    Spouse name: Not on file   Number of children: Not on file   Years of education: Not on file   Highest education level: Not on file  Occupational History   Not on file  Tobacco Use   Smoking status: Former    Packs/day: 0.25    Years: 20.00    Pack years: 5.00    Types: Cigarettes    Quit date: 02/06/2020    Years since quitting: 0.8   Smokeless tobacco: Never  Vaping Use   Vaping Use: Never used  Substance and Sexual Activity   Alcohol use: No    Alcohol/week: 0.0 standard drinks   Drug use: No   Sexual activity: Not Currently  Other Topics Concern   Not on file  Social History Narrative   Not on file   Social Determinants of Health   Financial Resource  Strain: Not on file  Food Insecurity: No Food Insecurity   Worried About Running Out of Food in the Last Year: Never true   Ran Out of Food in the Last Year: Never true  Transportation Needs: No Transportation Needs   Lack of Transportation (Medical): No   Lack of Transportation (Non-Medical): No  Physical Activity: Not on file  Stress: Not on file  Social Connections: Not on file  Intimate Partner Violence: Not on file    Outpatient Medications Prior to Visit  Medication Sig Dispense Refill   albuterol (VENTOLIN HFA) 108 (90 Base) MCG/ACT inhaler Inhale 2-4 puffs by mouth every 4 hours as needed for wheezing, cough, and/or shortness of breath 18 g 2   cetirizine (ZYRTEC) 10 MG tablet TAKE ONE TABLET BY MOUTH AT BEDTIME 90 tablet 0   mirtazapine (REMERON) 7.5 MG tablet Take 1 tablet (7.5 mg total) by mouth at bedtime. 30 tablet 0   Fluticasone-Salmeterol (ADVAIR) 250-50 MCG/DOSE AEPB Inhale 1 puff into the lungs 2 (two) times daily. (Patient not taking: No sig reported) 60 each 2   No facility-administered medications prior to visit.    No Known Allergies  ROS Review of Systems No sob, no cp, no fevers, normal bowel and bladder.     Objective:    Physical Exam Gen: nad Cardio: rrr, no m/r/g Pulm: clear in all fields, no wheezing, normal work of breathing  Extremity: trace BL edema Abdomen: non-tender, normal BS Neck: no obviously enlarged thyroid, no lymphadenopathy Neuro: strength and sensation intact in all extremities.   BP 102/69 (BP Location: Right Arm, Patient Position: Sitting, Cuff Size: Large)   Pulse 78   Temp 98.4 F (36.9 C)   Resp 16   Ht _0  (1.575 m)   Wt 189 lb 14.4 oz (86.1 kg)   LMP 11/29/2020 (Approximate)   SpO2 98%   BMI 34.73 kg/m  Wt Readings from Last 3 Encounters:  12/19/20 189 lb 14.4 oz (86.1 kg)  12/11/20 188 lb 14.4 oz (85.7 kg)  11/12/20 185 lb 9.6 oz (84.2 kg)     Health Maintenance Due  Topic Date Due   COVID-19 Vaccine (1)  Never done   Pneumococcal Vaccine 64-28 Years old (1 - PCV) Never done   Hepatitis C Screening  Never done   TETANUS/TDAP  Never done   COLONOSCOPY (Pts 45-34yr Insurance coverage will need to  be confirmed)  Never done   PAP SMEAR-Modifier  05/26/2020   INFLUENZA VACCINE  11/04/2020    There are no preventive care reminders to display for this patient.  Lab Results  Component Value Date   TSH 6.090 (H) 12/11/2020   Lab Results  Component Value Date   WBC 9.2 10/28/2020   HGB 12.0 10/28/2020   HCT 34.6 (L) 10/28/2020   MCV 88.0 10/28/2020   PLT 261 10/28/2020   Lab Results  Component Value Date   NA 136 10/28/2020   K 4.2 10/28/2020   CO2 22 10/28/2020   GLUCOSE 101 (H) 10/28/2020   BUN 13 10/28/2020   CREATININE 0.87 10/28/2020   BILITOT 0.6 10/28/2020   ALKPHOS 73 10/28/2020   AST 27 10/28/2020   ALT 20 10/28/2020   PROT 7.4 10/28/2020   ALBUMIN 4.0 10/28/2020   CALCIUM 9.2 10/28/2020   ANIONGAP 9 10/28/2020   EGFR 111 09/12/2020   Lab Results  Component Value Date   CHOL 163 09/12/2020   Lab Results  Component Value Date   HDL 45 09/12/2020   Lab Results  Component Value Date   LDLCALC 69 09/12/2020   Lab Results  Component Value Date   TRIG 309 (H) 09/12/2020   Lab Results  Component Value Date   CHOLHDL 3.0 02/08/2020   Lab Results  Component Value Date   HGBA1C 5.3 07/06/2014      Assessment & Plan:  1. Elevated glucose Since you have had elevated glucose on several prior labs and a family history of diabetes lets gets some labs to look into this.   - HgB A1c; Future  2. Abnormal TSH You have had several elevated TSH's in the past.  Lets check a T4 to see if this is true hypothyroidism vs subclinical hypothyroidism.   - T4; Future  3. Tobacco abuse You are doing great holding off on smoking.  Keep up the great work.  If there is anything we can do to help you please let us know.   4. Weight gain Likely related to your stopping  smoking unfortunately but could also be caused by hypothyroidism.  Lets follow up on the labs we are getting to see if starting levothyroxine would help.     5. History of edema Could also be due to hypothyroidism.  Lets check those labs.   6. Chronic obstructive pulmonary disease, unspecified COPD type ([place]) Much improved after you have stopped smoking.  Continue to use the albuterol as needed.   Follow-up: Return in about 6 weeks (around 01/30/2021) for fu on thyroid and weight gain. Collene Leyden MD

## 2020-12-19 NOTE — Patient Instructions (Signed)
1. Elevated glucose Since you have had elevated glucose on several prior labs and a family history of diabetes lets gets some labs to look into this.   - HgB A1c; Future  2. Abnormal TSH You have had several elevated TSH's in the past.  Lets check a T4 to see if this is true hypothyroidism vs subclinical hypothyroidism.   - T4; Future  3. Tobacco abuse You are doing great holding off on smoking.  Keep up the great work.  If there is anything we can do to help you please let us know.   4. Weight gain Likely related to your stopping smoking unfortunately but could also be caused by hypothyroidism.  Lets follow up on the labs we are getting to see if starting levothyroxine would help.     5. History of edema Could also be due to hypothyroidism.  Lets check those labs.   6. Chronic obstructive pulmonary disease, unspecified COPD type ([facility]) Much improved after you have stopped smoking.  Continue to use the albuterol as needed.

## 2020-12-20 LAB — HEMOGLOBIN A1C
Est. average glucose Bld gHb Est-mCnc: 105 mg/dL
Hgb A1c MFr Bld: 5.3 % (ref 4.8–5.6)

## 2020-12-20 LAB — T4: T4, Total: 6.7 ug/dL (ref 4.5–12.0)

## 2020-12-26 ENCOUNTER — Other Ambulatory Visit: Payer: Self-pay

## 2020-12-26 ENCOUNTER — Ambulatory Visit: Payer: Self-pay | Admitting: Licensed Clinical Social Worker

## 2020-12-26 ENCOUNTER — Other Ambulatory Visit: Payer: Self-pay | Admitting: Gerontology

## 2020-12-26 ENCOUNTER — Institutional Professional Consult (permissible substitution): Payer: Self-pay | Admitting: Licensed Clinical Social Worker

## 2020-12-26 DIAGNOSIS — F411 Generalized anxiety disorder: Secondary | ICD-10-CM

## 2020-12-26 DIAGNOSIS — G4700 Insomnia, unspecified: Secondary | ICD-10-CM

## 2020-12-26 DIAGNOSIS — F331 Major depressive disorder, recurrent, moderate: Secondary | ICD-10-CM

## 2020-12-26 MED ORDER — MIRTAZAPINE 7.5 MG PO TABS: 7.5000 mg | ORAL_TABLET | Freq: Every day | ORAL | 1 refills | Status: DC

## 2020-12-26 NOTE — BH Specialist Note (Signed)
Integrated Behavioral Health Follow Up Telephone Visit  MRN: [id number] Name: Melissa Dean   Total time: 60 minutes  Types of Service: Telephone visit Patient consents to telephone visit and 2 patient identifiers were used to identify patient   Interpretor:No. Interpretor Name and Language: N/A  Subjective: Melissa Dean is a 50 y.o. female accompanied by  herself Patient was referred by Melissa Horn, NP for mental health. Patient reports the following symptoms/concerns: The patient reports that she has been doing well since her last follow- up session. She noted that she still feels the physical effects of COVID such as joint pains and fatigue. She noted that otherwise she has been doing well. She discussed her plan of buying a home so her granddaughter can grow up in a safer neighborhood. She shared that she is doing well on her job and feels less work related stressors. [name] discussed financial and family stressors. She shared that she is looking forward to the holidays but this is the time of year when the stress increases to provide for her family. The client noted that she has been taking her medication at different times due to her schedule, but has forgotten a few does and had difficulty sleeping and felt more anxious on those days. She reported she is trying to find a good time to take her medication dso it will be easier to set a reminder on her phone. The patient denied any suicidal or homicidal thoughts.  Duration of problem: Years; Severity of problem: moderate  Objective: Mood: Euthymic and Affect: Appropriate Risk of harm to self or others: No plan to harm self or others  Life Context: Family and Social: see above School/Work: see above Self-Care: see above Life Changes: see above  Patient and/or Family's Strengths/Protective Factors: Concrete supports in place (healthy food, safe environments, etc.)  Goals Addressed: Patient will:  Reduce symptoms of:  agitation, anxiety, compulsions, depression, obsessions, and stress   Increase knowledge and/or ability of: coping skills, healthy habits, self-management skills, and stress reduction   Demonstrate ability to: Increase healthy adjustment to current life circumstances  Progress towards Goals: Ongoing  Interventions: Interventions utilized:  CBT Cognitive Behavioral Therapy was utilized by the clinician during today's follow up session. The clinician processed with the patient how they have been doing since the last follow-up session. The clinician provided a space for the patient to ventilate their frustrations regarding their current life circumstances. Clinician measured the patient's anxiety and depression on a numerical scale. Clinician encouraged the patient to take their mediation at the same time everyday exactly as prescribed for it to reach it's full intended effect.    Standardized Assessments completed: GAD-7 and PHQ 9 GAD-7    6 PHQ-9    2  Assessment: Patient currently experiencing see above.   Patient may benefit from see above.  Plan: Follow up with behavioral health clinician on : 01/14/2021 at 5:00 PM  Behavioral recommendations:  Referral(s): [facility] (In Clinic) "From scale of 1-10, how likely are you to follow plan?":   Judith Part, LCSWA

## 2021-01-08 NOTE — Telephone Encounter (Signed)
EROR

## 2021-01-14 ENCOUNTER — Ambulatory Visit: Payer: Self-pay | Admitting: Licensed Clinical Social Worker

## 2021-01-14 ENCOUNTER — Other Ambulatory Visit: Payer: Self-pay

## 2021-01-14 DIAGNOSIS — F411 Generalized anxiety disorder: Secondary | ICD-10-CM

## 2021-01-14 DIAGNOSIS — F331 Major depressive disorder, recurrent, moderate: Secondary | ICD-10-CM

## 2021-01-14 NOTE — BH Specialist Note (Signed)
Integrated Behavioral Health Follow Up In-Person Visit  MRN: [id number] Name: Melissa Dean   Total time: 60 minutes  Types of Service: Telephone visit Patient consents to telephone visit and 2 patient identifiers were used to identify patient   Interpretor:No. Interpretor Name and Language: N/A  Subjective: Melissa Dean is a 50 y.o. female accompanied by  herself Patient was referred by [name], NP  for Mental Health. Patient reports the following symptoms/concerns: The patient stated she is doing the same since her last follow-up appointment. 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 She discussed financial stressors impacting her life. She noted that with the holidays coming she typically feels more financial pressure. Melissa Dean reports that she continues to struggle falling asleep, but notes that she stays asleep longer with less frequent waking. The patient noted that her granddaughter sleeps with her and sometimes she sleeps with the television on. She shared that she continues to have low energy and feels that has more to do with age than depression. Melissa Dean noted that overall everything is going well in her life and her mood has improved. She shared that she is contemplating purchasing her first home soon. The patient denied any homicidal or suicidal thoughts.  Duration of problem: Years; Severity of problem: moderate  Objective: Mood: Euthymic and Affect: Appropriate Risk of harm to self or others: No plan to harm self or others  Life Context: Family and Social: see above School/Work: see above Self-Care: see above Life Changes: see above  Patient and/or Family's Strengths/Protective Factors: Concrete supports in place (healthy food, safe environments, etc.)  Goals Addressed: Patient will:  Reduce symptoms of: anxiety, depression, insomnia, and stress   Increase knowledge and/or ability of: coping skills, healthy habits, self-management  skills, and stress reduction   Demonstrate ability to: Increase healthy adjustment to current life circumstances  Progress towards Goals: Ongoing Interventions: Interventions utilized:  CBT Cognitive Behavioral Therapy was utilized by the clinician during today's follow up session. Clinician met with patient to identify needs related to stressors and functioning, and assess and monitor for signs and symptoms of anxiety and depression, and assess safety. The clinician processed with the patient how they have been doing since the last follow-up session.The clinician provided a space for the patient to ventilate their frustrations regarding their current life circumstances. Clinician measured the patient's anxiety and depression on a numerical scale. Clinician connected the patient to Perry Mount, MSW Intern at [employer] for resources. Clinician encouraged the patient to utilize their coping skills to deal with their current life circumstances.   Standardized Assessments completed: GAD-7 and PHQ 9 GAD-7 = 9 PHQ-9 = 6  Assessment: Patient currently experiencing see above.   Patient may benefit from see above.  Plan: Follow up with behavioral health clinician on : 02/25/2021 at 6:00 PM  Behavioral recommendations:  Referral(s): [facility] (In Clinic) "From scale of 1-10, how likely are you to follow plan?":   Lesli Albee, LCSWA

## 2021-01-21 ENCOUNTER — Other Ambulatory Visit: Payer: Self-pay | Admitting: Emergency Medicine

## 2021-01-21 ENCOUNTER — Other Ambulatory Visit: Payer: Self-pay

## 2021-01-21 DIAGNOSIS — J449 Chronic obstructive pulmonary disease, unspecified: Secondary | ICD-10-CM

## 2021-01-21 MED ORDER — ALBUTEROL SULFATE HFA 108 (90 BASE) MCG/ACT IN AERS: INHALATION_SPRAY | RESPIRATORY_TRACT | 2 refills | Status: DC

## 2021-01-28 ENCOUNTER — Encounter: Payer: Self-pay | Admitting: Gerontology

## 2021-01-28 ENCOUNTER — Ambulatory Visit: Payer: Self-pay | Admitting: Gerontology

## 2021-01-28 ENCOUNTER — Other Ambulatory Visit: Payer: Self-pay

## 2021-01-28 VITALS — BP 109/73 | HR 76 | Temp 98.1°F | Resp 16 | Ht 62.0 in | Wt 195.5 lb

## 2021-01-28 DIAGNOSIS — R635 Abnormal weight gain: Secondary | ICD-10-CM

## 2021-01-28 NOTE — Progress Notes (Signed)
Established Patient Office Visit  Subjective:  Patient ID: Anyelina Claycomb, female    DOB: 05-21-1970  Age: 50 y.o. MRN: [id number]  CC:  Chief Complaint  Patient presents with   Follow-up    Labs drawn 12/11/20 and 12/19/20    HPI Korynn Kenedy is a 50 year old female who has history of anemia, anxiety, asthma, bronchitis, COPD, depression presents for lab review. Her labs were within normal limits. She's concerned about gaining weight. 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 She reports drinking carbonated water and Zero calorie sodas. She denies chest pain, palpitation, shortness of breath and light headedness. Overall, she states that she's doing well and offers no further complaint.  Past Medical History:  Diagnosis Date   Anemia    Anxiety    Asthma    Bronchitis    COPD (chronic obstructive pulmonary disease) ([place])    Depression     Past Surgical History:  Procedure Laterality Date   BREAST BIOPSY Right 05/24/2016   coil marker. PSEUDO-ANGIOMATOUS STROMAL HYPERPLASIA. NEGATIVE FOR ATYPIA   BREAST BIOPSY Right 06/02/2017   ribbon marker.: [place]. PSEUDO-ANGIOMATOUS STROMAL HYPERPLASIA   CESAREAN SECTION     CESAREAN SECTION     CHOLECYSTECTOMY     TONSILLECTOMY     TUBAL LIGATION      Family History  Problem Relation Age of Onset   Other Mother        unknown medical history   Kidney disease Father    Hypertension Father    Diabetes Father    Cancer Father        lung cancer   Healthy Daughter    Healthy Son    Healthy Son    Hypertension Paternal Grandfather    Diabetes Paternal Grandfather    Thyroid disease Granddaughter    Heart disease Granddaughter    Down syndrome Granddaughter    Thyroid disease Other    Diabetes Half-Brother    Hypertension Half-Brother    Heart attack Half-Brother 76   Hypertension  Half-Brother    Multiple sclerosis Half-Brother    Breast cancer Neg Hx     Social History   Socioeconomic History   Marital status: Single    Spouse name: Not on file   Number of children: Not on file   Years of education: Not on file   Highest education level: Not on file  Occupational History   Not on file  Tobacco Use   Smoking status: Former    Packs/day: 0.25    Years: 20.00    Pack years: 5.00    Types: Cigarettes    Quit date: 02/06/2020    Years since quitting: 0.9   Smokeless tobacco: Never  Vaping Use   Vaping Use: Never used  Substance and Sexual Activity   Alcohol use: No    Alcohol/week: 0.0 standard drinks   Drug use: No   Sexual activity: Not Currently  Other Topics Concern   Not on file  Social History Narrative   Not on file   Social Determinants of Health   Financial Resource Strain: Not on file  Food Insecurity: No Food Insecurity   Worried About Running Out of Food in the Last Year: Never true   Ran Out of Food in the Last Year: Never true  Transportation Needs: No Transportation Needs   Lack of Transportation (Medical): No  Lack of Transportation (Non-Medical): No  Physical Activity: Not on file  Stress: Not on file  Social Connections: Not on file  Intimate Partner Violence: Not on file    Outpatient Medications Prior to Visit  Medication Sig Dispense Refill   albuterol (VENTOLIN HFA) 108 (90 Base) MCG/ACT inhaler Inhale 2-4 puffs by mouth every 4 hours as needed for wheezing, cough, and/or shortness of breath 18 g 2   cetirizine (ZYRTEC) 10 MG tablet TAKE ONE TABLET BY MOUTH AT BEDTIME 90 tablet 0   mirtazapine (REMERON) 7.5 MG tablet Take 1 tablet (7.5 mg total) by mouth at bedtime. 30 tablet 1   Fluticasone-Salmeterol (ADVAIR) 250-50 MCG/DOSE AEPB Inhale 1 puff into the lungs 2 (two) times daily. (Patient not taking: No sig reported) 60 each 2   No facility-administered medications prior to visit.    No Known  Allergies  ROS Review of Systems  Constitutional: Negative.   Respiratory: Negative.    Cardiovascular: Negative.   Endocrine: Negative.   Neurological: Negative.   Psychiatric/Behavioral: Negative.       Objective:    Physical Exam HENT:     Head: Normocephalic and atraumatic.  Cardiovascular:     Rate and Rhythm: Normal rate and regular rhythm.     Pulses: Normal pulses.     Heart sounds: Normal heart sounds.  Pulmonary:     Effort: Pulmonary effort is normal.     Breath sounds: Normal breath sounds.  Skin:    General: Skin is warm.  Neurological:     General: No focal deficit present.     Mental Status: She is alert and oriented to person, place, and time. Mental status is at baseline.  Psychiatric:        Mood and Affect: Mood normal.        Behavior: Behavior normal.        Thought Content: Thought content normal.        Judgment: Judgment normal.    BP 109/73 (BP Location: Right Arm, Patient Position: Sitting, Cuff Size: Large)   Pulse 76   Temp 98.1 F (36.7 C)   Resp 16   Ht '5\' 2"'  (1.575 m)   Wt 195 lb 8 oz (88.7 kg)   LMP 12/31/2020 (Approximate)   SpO2 97%   BMI 35.76 kg/m  Wt Readings from Last 3 Encounters:  01/28/21 195 lb 8 oz (88.7 kg)  12/19/20 189 lb 14.4 oz (86.1 kg)  12/11/20 188 lb 14.4 oz (85.7 kg)   Encouraged weight loss   Health Maintenance Due  Topic Date Due   COVID-19 Vaccine (1) Never done   Pneumococcal Vaccine 71-50 Years old (1 - PCV) Never done   Hepatitis C Screening  Never done   TETANUS/TDAP  Never done   COLONOSCOPY (Pts 45-40yr Insurance coverage will need to be confirmed)  Never done   PAP SMEAR-Modifier  05/26/2020   INFLUENZA VACCINE  11/04/2020    There are no preventive care reminders to display for this patient.  Lab Results  Component Value Date   TSH 6.090 (H) 12/11/2020   Lab Results  Component Value Date   WBC 9.2 10/28/2020   HGB 12.0 10/28/2020   HCT 34.6 (L) 10/28/2020   MCV 88.0 10/28/2020    PLT 261 10/28/2020   Lab Results  Component Value Date   NA 136 10/28/2020   K 4.2 10/28/2020   CO2 22 10/28/2020   GLUCOSE 101 (H) 10/28/2020   BUN 13 10/28/2020  CREATININE 0.87 10/28/2020   BILITOT 0.6 10/28/2020   ALKPHOS 73 10/28/2020   AST 27 10/28/2020   ALT 20 10/28/2020   PROT 7.4 10/28/2020   ALBUMIN 4.0 10/28/2020   CALCIUM 9.2 10/28/2020   ANIONGAP 9 10/28/2020   EGFR 111 09/12/2020   Lab Results  Component Value Date   CHOL 163 09/12/2020   Lab Results  Component Value Date   HDL 45 09/12/2020   Lab Results  Component Value Date   LDLCALC 69 09/12/2020   Lab Results  Component Value Date   TRIG 309 (H) 09/12/2020   Lab Results  Component Value Date   CHOLHDL 3.0 02/08/2020   Lab Results  Component Value Date   HGBA1C 5.3 12/19/2020      Assessment & Plan:    1. Weight gain -She was encouraged to exercise more. She was provided with exercise tips she can do inside the house. -She was educated on portion size, reading food label and calorie count. - Will recheck her TSH in 3 months.      Follow-up: Return in about 3 months (around 04/30/2021), or if symptoms worsen or fail to improve.    Zaliah Wissner Jerold Coombe, NP

## 2021-01-28 NOTE — Patient Instructions (Signed)

## 2021-02-13 ENCOUNTER — Other Ambulatory Visit: Payer: Self-pay

## 2021-02-25 ENCOUNTER — Ambulatory Visit: Payer: Self-pay | Admitting: Licensed Clinical Social Worker

## 2021-02-26 ENCOUNTER — Ambulatory Visit: Payer: Self-pay | Admitting: Licensed Clinical Social Worker

## 2021-02-26 ENCOUNTER — Other Ambulatory Visit: Payer: Self-pay

## 2021-02-26 DIAGNOSIS — F411 Generalized anxiety disorder: Secondary | ICD-10-CM

## 2021-02-26 DIAGNOSIS — F339 Major depressive disorder, recurrent, unspecified: Secondary | ICD-10-CM

## 2021-02-26 DIAGNOSIS — G4700 Insomnia, unspecified: Secondary | ICD-10-CM

## 2021-02-26 NOTE — BH Specialist Note (Signed)
Integrated Behavioral Health Follow Up Telephone Visit  MRN: [id number] Name: Melissa Dean   Total time: 60 minutes  Types of Service: Telephone visit Patient consents to telephone visit and 2 patient identifiers were used to identify patient   Interpretor:No. Interpretor Name and Language: N/A  Subjective: Melissa Dean is a 50 y.o. female accompanied by  herself Patient was referred by [name], NP for Mental Health. Patient reports the following symptoms/concerns: The patient reports that she has been doing well since her last follow-up appointment. Melissa Dean shared that she continues to struggle with falling asleep taking in excess of an hour each night. However she noted that once asleep she remains asleep and feels rested the next day. She shared that she feels that her sleep difficulties are attributed to menopause. She explained she has not has a menses since September and has had some minor hot flashes. Melissa Dean discussed family stressors impacting her life. 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 She discussed her plans for Thanksgiving and noted that her daughter and step daughter came home from college last night and surprised her. Melissa Dean reports taking mirtazapine 7.5 MG as prescribed daily and has no medication concerns at this time. The patient denied any suicidal or homicidal thoughts.  Duration of problem: Years; Severity of problem: moderate  Objective: Mood: Euthymic and Affect: Appropriate Risk of harm to self or others: No plan to harm self or others  Life Context: Family and Social: see above School/Work: see above Self-Care: see above Life Changes: see above  Patient and/or Family's Strengths/Protective Factors: Concrete supports in place (healthy food, safe environments, etc.) and Sense of purpose  Goals Addressed: Patient will:  Reduce symptoms of: agitation, anxiety, insomnia, and  stress   Increase knowledge and/or ability of: coping skills, healthy habits, self-management skills, and stress reduction   Demonstrate ability to: Increase healthy adjustment to current life circumstances  Progress towards Goals: Ongoing  Interventions: Interventions utilized:  CBT Cognitive Behavioral Therapywas utilized by the clinician during today's follow up session. Clinician met with patient to identify needs related to stressors and functioning, and assess and monitor for signs and symptoms of anxiety and depression, and assess safety. The clinician processed with the patient how they have been doing since the last follow-up session. Clinician measured the patient's anxiety and depression on a numerical scale.  Clinician intervened with positive regard and optimism to validate client's emotions, and supported client in exploring ways to strengthen her use of coping skills in response to life stressors. Clinician assessed the patient's pattern of sleep, bedtime routines, activities associated with the bed, activity and energy level while awake, night time snacking, stimulant use, daytime napping, total sleep amounts. Clinician explored the patients thoughts and associated emotions regarding sleep. Clinician encouraged the patient to contact her primary care provider to discuss her concerns regarding menopause and suggested discussing with her provider adding over the counter melatonin to her routine to help with sleep. Session ended with scheduling.  Standardized Assessments completed: GAD-7 and PHQ 9 GAD-7 =  11 PHQ-9 =   9  Assessment: Patient currently experiencing see above.   Patient may benefit from see above.  Plan: Follow up with behavioral health clinician on : 03/19/2021 at 9:00 am  Behavioral recommendations:  Referral(s): [place] (In Clinic) "From scale of 1-10, how likely are you to follow plan?":   Lesli Albee, LCSWA

## 2021-03-19 ENCOUNTER — Ambulatory Visit: Payer: Self-pay | Admitting: Licensed Clinical Social Worker

## 2021-03-19 ENCOUNTER — Other Ambulatory Visit: Payer: Self-pay

## 2021-03-19 DIAGNOSIS — G4700 Insomnia, unspecified: Secondary | ICD-10-CM

## 2021-03-19 DIAGNOSIS — F339 Major depressive disorder, recurrent, unspecified: Secondary | ICD-10-CM

## 2021-03-19 DIAGNOSIS — F411 Generalized anxiety disorder: Secondary | ICD-10-CM

## 2021-03-19 NOTE — BH Specialist Note (Signed)
Integrated Behavioral Health Follow Up Telephone Visit  MRN: [id number] Name: Melissa Dean   Total time: 60 minutes  Types of Service: Telephone visitPatient consents to telephone visit and 2 patient identifiers were used to identify patient   Interpretor:No. Interpretor Name and Language: N/A  Subjective: Melissa Dean is a 50 y.o. female accompanied by  herself Patient was referred by [name], NP  for mental health. Patient reports the following symptoms/concerns: The patient reports that she has been doing the same since her last follow-up appointment. She explained that she is looking for either a higher paying job or a second job to support herself and her granddaughter and son whom live with her. The patient reported that she received an eviction notice this week. She stated that she can pay the back rent but it will take her entire paycheck. The patient discussed other financial and family stressors impacting her life. She shared that she continues to struggle with sleep, but feels Melissa Dean is in response to the additional stress in her life. Smith noted that she feels weary overall of struggling to finically survive, but is confident that things will work out in the end. The patient denied any suicidal or homicidal thoughts.  Duration of problem: Years ; Severity of problem: moderate  Objective: Mood: Euthymic and Affect: Appropriate Risk of harm to self or others: No plan to harm self or others  Life Context: Family and Social: see above School/Work: see above Self-Care: see above Life Changes: see above  Patient and/or Family's Strengths/Protective Factors: Concrete supports in place (healthy food, safe environments, etc.)  Goals Addressed: Patient will:  Reduce symptoms of: anxiety, depression, insomnia, and stress   Increase knowledge and/or ability of: coping skills, healthy habits, self-management skills, and stress reduction   Demonstrate ability to:  Increase healthy adjustment to current life circumstances and Increase adequate support systems for patient/family  Progress towards Goals: Ongoing  Interventions: Interventions utilized:  CBT Cognitive Behavioral Therapywas utilized by the clinician during today's follow up session. Clinician met with patient to identify needs related to stressors and functioning, and assess and monitor for signs and symptoms of anxiety and depression, and assess safety. The clinician processed with the patient how they have been doing since the last follow-up session. Clinician measured the patient's anxiety and depression on a numerical scale. Clinician intervened with positive regard and optimism to validate client's emotions, and supported client in exploring ways to increase her use of self care to support and overall functioning and reduce her anxiety and stress. Clinician provided the patient with referrals to the [employer], [employer], and [facility].  Session ended with scheduling.  Standardized Assessments completed: GAD-7 and PHQ 9 PHQ-9 =  13 GAD-7 =  11 Assessment: Patient currently experiencing see above.   Patient may benefit from see above.  Plan: Follow up with behavioral health clinician on : 04/10/2020 at 3:00 PM  Behavioral recommendations:  Referral(s): [place] (In Clinic) "From scale of 1-10, how likely are you to follow plan?":   Lesli Albee, LCSWA

## 2021-04-10 ENCOUNTER — Encounter: Payer: Self-pay | Admitting: Licensed Clinical Social Worker

## 2021-04-15 ENCOUNTER — Other Ambulatory Visit: Payer: Self-pay

## 2021-04-15 ENCOUNTER — Ambulatory Visit: Payer: Self-pay | Admitting: Licensed Clinical Social Worker

## 2021-04-15 ENCOUNTER — Encounter: Payer: Self-pay | Admitting: Licensed Clinical Social Worker

## 2021-04-15 DIAGNOSIS — F411 Generalized anxiety disorder: Secondary | ICD-10-CM

## 2021-04-15 DIAGNOSIS — F339 Major depressive disorder, recurrent, unspecified: Secondary | ICD-10-CM

## 2021-04-15 NOTE — BH Specialist Note (Signed)
Integrated Behavioral Health via Telemedicine Visit ° °04/15/2021 °Melissa Dean °[id number] ° ° °Total time: 60 ° °Referring Provider: Elizabeth Chioma, NP  °Patient/Family location: The patient's home address.  °[facility] Provider location: [facility]  °All persons participating in visit: Latina Iles and Heather Pruitt, MSW, LCSW-A °Types of Service: Telephone visit ° °I connected with Melissa Dean and/or Melissa Dean's   (Video is Caregility application) and verified that I am speaking with the correct person using two identifiers. Discussed confidentiality: Yes  ° °I discussed the limitations of telemedicine and the availability of in person appointments.  Discussed there is a possibility of technology failure and discussed alternative modes of communication if that failure occurs. ° °Patient and/or legal guardian expressed understanding and consented to Telemedicine visit: Yes  ° °Presenting Concerns: °Patient and/or family reports the following symptoms/concerns: The patient reports that she has been doing okay since her last follow-up appointment. She explained that she was recently evicted from her home and has until Saturday to have her belongings moved to storage. She explained that she cannot afford the rent on her income alone. She shared that her adult son is moving in with his girlfriend in [place] for a few weeks and she is taking her four year old granddaughter to stay with the child's mother for two days, but is uncertain where they will go from there. She discussed her granddaughter and seeking legal custody, but is concerned her housing situation will be a problem. 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 The patient noted that while she is worried about her current circumstances she feels she is coping well. The patient stated she is sleeping better and continues to take  mirtazapine 7.5 MG before bedtime as prescribed. Blue discussed family dynamics and stressors impacting her life. She noted that she feels the next couple of days will benefit her granddaughter by seeing her mother and younger sister. The patient denied any suicidal or homicidal thoughts.  °Duration of problem: Years; Severity of problem: moderate ° °Patient and/or Family's Strengths/Protective Factors: °Social connections and Sense of purpose ° °Goals Addressed: °Patient will: ° Reduce symptoms of: agitation, anxiety, depression, insomnia, and stress  ° Increase knowledge and/or ability of: coping skills, healthy habits, self-management skills, and stress reduction  ° Demonstrate ability to: Increase healthy adjustment to current life circumstances and Increase adequate support systems for patient/family ° °Progress towards Goals: °Ongoing ° °Interventions: °Interventions utilized:  CBT Cognitive Behavioral Therapy and Link to Community Resourceswas utilized by the clinician during today's follow up session. Clinician met with patient to identify needs related to stressors and functioning, and assess and monitor for signs and symptoms of anxiety and depression, and assess safety. The clinician processed with the patient how they have been doing since the last follow-up session. Clinician measured the patient's anxiety and depression on a numerical scale. The writer provided the patient with contact information for [facility] and explained no appointment is required walk in hours are Monday- Thursday 8 am-1 pm. Additionally clinician provided the patient with the contact information for [employer], [employer] and [employer]. Clinician encouraged the patient to reach out to the Department of Social Services for emergency assistance. The clinician encouraged the patient to utilize their coping skills to deal with their current life circumstances.   °Standardized Assessments completed: GAD-7 and PHQ  9 °GAD-7 = 14 °  PHQ-9 =   4 ° °Assessment: °Patient currently experiencing see above.  ° °Patient may benefit from see above. ° °Plan: °Follow up with behavioral health clinician on : 04/24/2021 at 11:00 am  °Behavioral recommendations:  °Referral(s): [facility] (In Clinic) ° °I discussed the assessment and treatment plan with the patient and/or parent/guardian. They were provided an opportunity to ask questions and all were answered. They agreed with the plan and demonstrated an understanding of the instructions. °  °They were advised to call back or seek an in-person evaluation if the symptoms worsen or if the condition fails to improve as anticipated. ° °Heather J Pruitt, LCSWA °

## 2021-04-24 ENCOUNTER — Ambulatory Visit: Payer: Self-pay | Admitting: Licensed Clinical Social Worker

## 2021-04-24 ENCOUNTER — Other Ambulatory Visit: Payer: Self-pay | Admitting: Gerontology

## 2021-04-24 ENCOUNTER — Other Ambulatory Visit: Payer: Self-pay

## 2021-04-24 DIAGNOSIS — F411 Generalized anxiety disorder: Secondary | ICD-10-CM

## 2021-04-24 DIAGNOSIS — G4700 Insomnia, unspecified: Secondary | ICD-10-CM

## 2021-04-24 DIAGNOSIS — F339 Major depressive disorder, recurrent, unspecified: Secondary | ICD-10-CM
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
## 2021-04-29 ENCOUNTER — Ambulatory Visit: Payer: Self-pay | Admitting: Licensed Clinical Social Worker

## 2021-04-30 ENCOUNTER — Other Ambulatory Visit: Payer: Self-pay

## 2021-04-30 ENCOUNTER — Encounter: Payer: Self-pay | Admitting: Gerontology

## 2021-04-30 ENCOUNTER — Ambulatory Visit: Payer: Self-pay | Admitting: Gerontology

## 2021-04-30 VITALS — BP 113/75 | HR 86 | Temp 97.9°F | Ht 62.0 in | Wt 199.2 lb

## 2021-04-30 DIAGNOSIS — G4700 Insomnia, unspecified: Secondary | ICD-10-CM

## 2021-04-30 MED ORDER — MIRTAZAPINE 7.5 MG PO TABS: 7.5000 mg | ORAL_TABLET | Freq: Every day | ORAL | 1 refills | Status: DC

## 2021-04-30 NOTE — Progress Notes (Signed)
Established Patient Office Visit  Subjective:  Patient ID: Melissa Dean, female    DOB: [birth year]  Age: 52 y.o. MRN: [id number]  CC:  Chief Complaint  Patient presents with   Follow-up    HPI Ayisha Pol is a 51 year old female who has history of anemia, anxiety, asthma, bronchitis, COPD, depression presents for routine follow up and medication refill. She states that she's compliant with her medications and continues to make healthy lifestyle changes. She states that her mood is good, denies suicidal nor homicidal ideation. Overall, she states that she's doing well and offers no further complaint.  Past Medical History:  Diagnosis Date   Anemia    Anxiety    Asthma    Bronchitis    COPD (chronic obstructive pulmonary disease) ([place])    Depression     Past Surgical History:  Procedure Laterality Date   BREAST BIOPSY Right 05/24/2016   coil marker. PSEUDO-ANGIOMATOUS STROMAL HYPERPLASIA. NEGATIVE FOR ATYPIA   BREAST BIOPSY Right 06/02/2017   ribbon marker.: [place]. PSEUDO-ANGIOMATOUS STROMAL HYPERPLASIA   CESAREAN SECTION     CESAREAN SECTION     CHOLECYSTECTOMY     TONSILLECTOMY     TUBAL LIGATION      Family History  Problem Relation Age of Onset   Other Mother        unknown medical history   Kidney disease Father    Hypertension Father    Diabetes Father    Cancer Father        lung cancer   Healthy Daughter    Healthy Son    Healthy Son    Hypertension Paternal Grandfather    Diabetes Paternal Grandfather    Thyroid disease Granddaughter    Heart disease Granddaughter    Down syndrome Granddaughter    Thyroid disease Other    Diabetes Half-Brother    Hypertension Half-Brother    Heart attack Half-Brother 32   Hypertension Half-Brother    Multiple sclerosis Half-Brother    Breast cancer Neg Hx     Social History   Socioeconomic History   Marital status: Single    Spouse name: Not on file   Number of  children: Not on file   Years of education: Not on file   Highest education level: Not on file  Occupational History   Not on file  Tobacco Use   Smoking status: Former    Packs/day: 0.25    Years: 20.00    Pack years: 5.00    Types: Cigarettes    Quit date: 02/06/2020    Years since quitting: 1.2   Smokeless tobacco: Never  Vaping Use   Vaping Use: Never used  Substance and Sexual Activity   Alcohol use: No    Alcohol/week: 0.0 standard drinks   Drug use: No   Sexual activity: Not Currently  Other Topics Concern   Not on file  Social History Narrative   Not on file   Social Determinants of Health   Financial Resource Strain: Not on file  Food Insecurity: No Food Insecurity   Worried About Running Out of Food in the Last Year: Never true   Ran Out of Food in the Last Year: Never true  Transportation Needs: No Transportation Needs   Lack of Transportation (Medical): No   Lack of Transportation (Non-Medical): No  Physical Activity: Not on file  Stress: Not on file  Social Connections: Not on file  Intimate Partner Violence: Not on file    Outpatient  Medications Prior to Visit  Medication Sig Dispense Refill   albuterol (VENTOLIN HFA) 108 (90 Base) MCG/ACT inhaler Inhale 2-4 puffs by mouth every 4 hours as needed for wheezing, cough, and/or shortness of breath 18 g 2   mirtazapine (REMERON) 7.5 MG tablet Take 1 tablet (7.5 mg total) by mouth at bedtime. 30 tablet 1   cetirizine (ZYRTEC) 10 MG tablet TAKE ONE TABLET BY MOUTH AT BEDTIME 90 tablet 0   Fluticasone-Salmeterol (ADVAIR) 250-50 MCG/DOSE AEPB Inhale 1 puff into the lungs 2 (two) times daily. (Patient not taking: Reported on 09/18/2020) 60 each 2   No facility-administered medications prior to visit.    No Known Allergies  ROS Review of Systems  Constitutional: Negative.   Respiratory: Negative.    Cardiovascular: Negative.   Skin: Negative.   Neurological: Negative.   Psychiatric/Behavioral: Negative.        Objective:    Physical Exam HENT:     Head: Normocephalic and atraumatic.     Mouth/Throat:     Mouth: Mucous membranes are moist.  Eyes:     Extraocular Movements: Extraocular movements intact.     Conjunctiva/sclera: Conjunctivae normal.     Pupils: Pupils are equal, round, and reactive to light.  Cardiovascular:     Rate and Rhythm: Normal rate and regular rhythm.     Pulses: Normal pulses.     Heart sounds: Normal heart sounds.  Pulmonary:     Effort: Pulmonary effort is normal.     Breath sounds: Normal breath sounds.  Neurological:     General: No focal deficit present.     Mental Status: She is alert and oriented to person, place, and time. Mental status is at baseline.  Psychiatric:        Mood and Affect: Mood normal.        Behavior: Behavior normal.        Thought Content: Thought content normal.        Judgment: Judgment normal.    BP 113/75 (BP Location: Right Arm, Patient Position: Sitting, Cuff Size: Large)    Pulse 86    Temp 97.9 F (36.6 C)    Ht '5\' 2"'  (1.575 m)    Wt 199 lb 3.2 oz (90.4 kg)    SpO2 96%    BMI 36.43 kg/m  Wt Readings from Last 3 Encounters:  04/30/21 199 lb 3.2 oz (90.4 kg)  01/28/21 195 lb 8 oz (88.7 kg)  12/19/20 189 lb 14.4 oz (86.1 kg)   Encouraged weight loss  Health Maintenance Due  Topic Date Due   COVID-19 Vaccine (1) Never done   Hepatitis C Screening  Never done   TETANUS/TDAP  Never done   COLONOSCOPY (Pts 45-71yr Insurance coverage will need to be confirmed)  Never done   PAP SMEAR-Modifier  05/26/2020    There are no preventive care reminders to display for this patient.  Lab Results  Component Value Date   TSH 6.090 (H) 12/11/2020   Lab Results  Component Value Date   WBC 9.2 10/28/2020   HGB 12.0 10/28/2020   HCT 34.6 (L) 10/28/2020   MCV 88.0 10/28/2020   PLT 261 10/28/2020   Lab Results  Component Value Date   NA 136 10/28/2020   K 4.2 10/28/2020   CO2 22 10/28/2020   GLUCOSE 101 (H)  10/28/2020   BUN 13 10/28/2020   CREATININE 0.87 10/28/2020   BILITOT 0.6 10/28/2020   ALKPHOS 73 10/28/2020   AST 27 10/28/2020  ALT 20 10/28/2020   PROT 7.4 10/28/2020   ALBUMIN 4.0 10/28/2020   CALCIUM 9.2 10/28/2020   ANIONGAP 9 10/28/2020   EGFR 111 09/12/2020   Lab Results  Component Value Date   CHOL 163 09/12/2020   Lab Results  Component Value Date   HDL 45 09/12/2020   Lab Results  Component Value Date   LDLCALC 69 09/12/2020   Lab Results  Component Value Date   TRIG 309 (H) 09/12/2020   Lab Results  Component Value Date   CHOLHDL 3.0 02/08/2020   Lab Results  Component Value Date   HGBA1C 5.3 12/19/2020      Assessment & Plan:    1. Insomnia, unspecified type - She's sleeping better, will continue on current medication and follow up with [facility] Behavioral health team. She was advised to notify clinic and call the Crisis help line with worsening symptoms. - mirtazapine (REMERON) 7.5 MG tablet; Take 1 tablet (7.5 mg total) by mouth at bedtime.  Dispense: 30 tablet; Refill: 1     Follow-up: Return in about 6 months (around 10/23/2021), or if symptoms worsen or fail to improve.    Slyvia Lartigue Jerold Coombe, NP

## 2021-05-01 ENCOUNTER — Ambulatory Visit: Payer: Self-pay | Admitting: Licensed Clinical Social Worker

## 2021-05-01 ENCOUNTER — Telehealth: Payer: Self-pay | Admitting: Licensed Clinical Social Worker

## 2021-05-01 NOTE — Telephone Encounter (Signed)
Called the patient twice during today's scheduled appointment; no answer, left a voicemail with the clinic contact information so they may reschedule.   

## 2021-05-22 ENCOUNTER — Telehealth: Payer: Self-pay | Admitting: Licensed Clinical Social Worker

## 2021-05-22 NOTE — Telephone Encounter (Signed)
Made an appointment to get patient back on HP's schedule. Patient is in need of housing so I told her I would begin looking into options for her.

## 2021-05-27 ENCOUNTER — Other Ambulatory Visit: Payer: Self-pay | Admitting: Gerontology

## 2021-05-27 ENCOUNTER — Ambulatory Visit: Payer: Self-pay | Admitting: Licensed Clinical Social Worker

## 2021-05-27 ENCOUNTER — Other Ambulatory Visit: Payer: Self-pay

## 2021-05-27 DIAGNOSIS — G4700 Insomnia, unspecified: Secondary | ICD-10-CM

## 2021-05-27 DIAGNOSIS — F411 Generalized anxiety disorder: Secondary | ICD-10-CM

## 2021-05-27 DIAGNOSIS — F339 Major depressive disorder, recurrent, unspecified: Secondary | ICD-10-CM

## 2021-05-27 MED ORDER — MIRTAZAPINE 15 MG PO TABS: 7.5000 mg | ORAL_TABLET | Freq: Every day | ORAL | 1 refills | Status: DC

## 2021-05-27 NOTE — BH Specialist Note (Signed)
Integrated Behavioral Health via Telemedicine Visit  05/27/2021 Melissa Dean [id number]   Referring Provider: Hurman Horn, NP Patient/Family location: The patient is at her granddaughter's mother's home.  [facility] Provider location: [facility] All persons participating in visit: Karleigh Gongora and Rhett Bannister, MSW, LCSW-A Types of Service: Telephone visit  I connected with Melissa Dean via Telephone or Video Enabled Telemedicine Application  (Video is Caregility application) and verified that I am speaking with the correct person using two identifiers. Discussed confidentiality: Yes   I discussed the limitations of telemedicine and the availability of in person appointments. Discussed there is a possibility of technology failure and discussed alternative modes of communication if that failure occurs.  Patient and/or legal guardian expressed understanding and consented to Telemedicine visit: Yes   Presenting Concerns: Patient and/or family reports the following symptoms/concerns: The patient reports that she has been doing well since her last follow-up appointment. She shared that her son and his family are also being evicted because the landlord sold the home. [name] explained that her family have decided to look for a larger rental home so they can all move into together and split the costs. She shared that she expects she and her four year old granddaughter will have a home again in a couple of months. The patient noted that living together will be beneficial because they can split childcare duties and she will be able to return to work. 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 [name] shared that she has been  managing there current stress by taking breaks to drink coffee by herself, using deep breathing, and trying to think positive thoughts. She reported that she continues to take mirtazapine 7.5 MG  at bedtime as prescribed. She stated she continues to struggle with sleep but feels this is also due to sleeping on couches at her  friends and families homes. [name] denied any suicidal or homicidal thoughts.    Duration of problem: Years; Severity of problem: moderate  Patient and/or Family's Strengths/Protective Factors: Social connections, Concrete supports in place (healthy food, safe environments, etc.), and Sense of purpose  Goals Addressed: Patient will:  Reduce symptoms of: agitation, anxiety, depression, insomnia, and stress   Increase knowledge and/or ability of: coping skills, healthy habits, self-management skills, and stress reduction   Demonstrate ability to: Increase healthy adjustment to current life circumstances and Increase adequate support systems for patient/family  Progress towards Goals: Ongoing  Interventions: Interventions utilized:  CBT Cognitive Behavioral Therapy was utilized by the clinician during today's follow up session. Clinician met with patient to identify needs related to stressors and functioning, and assess and monitor for signs and symptoms of anxiety and depression, and assess safety. The clinician processed with the patient how they have been doing since the last follow-up session. Clinician measured the patient's anxiety and depression on a numerical scale. The clinician encouraged the patient to continue to utilize their coping skills to deal with their current life circumstances.   Standardized Assessments completed: GAD-7 and PHQ 9 PHQ-9 = 13 GAD-7 = 17    Assessment: Patient currently experiencing see above.   Patient may benefit from see above.  Plan: Follow up with behavioral health clinician on : 06/05/2021 at 3:00  PM  Behavioral  recommendations:  Referral(s): [facility] [employer] (In Clinic)  I discussed the assessment and treatment plan with the patient and/or parent/guardian. They were provided an opportunity to ask questions and all were answered. They agreed with the plan and demonstrated an understanding of the instructions.   They were advised to call back or seek an in-person evaluation if the symptoms worsen or if the condition fails to improve as anticipated.  Judith Part, LCSWA

## 2021-06-05 ENCOUNTER — Ambulatory Visit: Payer: Self-pay | Admitting: Licensed Clinical Social Worker

## 2021-06-11 ENCOUNTER — Ambulatory Visit: Payer: Self-pay | Admitting: Licensed Clinical Social Worker

## 2021-06-12 ENCOUNTER — Ambulatory Visit: Payer: Self-pay | Admitting: Licensed Clinical Social Worker

## 2021-06-12 ENCOUNTER — Telehealth: Payer: Self-pay | Admitting: Licensed Clinical Social Worker

## 2021-06-12 NOTE — Telephone Encounter (Signed)
Called the patient during today's scheduled appointment; no answer, left a voicemail with the clinic contact information and a rescheduled date and time for her appointment.   ?

## 2021-06-19 ENCOUNTER — Other Ambulatory Visit: Payer: Self-pay

## 2021-06-19 ENCOUNTER — Ambulatory Visit: Payer: Self-pay | Admitting: Licensed Clinical Social Worker

## 2021-06-19 NOTE — BH Specialist Note (Signed)
Integrated Behavioral Health via Telemedicine Visit ? ?06/19/2021 ?Melissa Dean ?[id number] ? ?Number of [place] Clinician visits: No data recorded ?Session Start time: No data recorded  ?Session End time: No data recorded ?Total time in minutes: No data recorded ? ?Referring Provider: [name], NP  ?Patient/Family location: Melissa Dean, [place]  ?[facility] Provider location: [facility] [place] ?All persons participating in visit: [name] and [name], MSW, NP  ?Types of Service: Telephone visit ? ?I connected with [name] via  Telephone or Video Enabled Telemedicine Application  (Video is Caregility application) and verified that I am speaking with the correct person using two identifiers. Discussed confidentiality: Yes  ? ?I discussed the limitations of telemedicine and the availability of in person appointments.  Discussed there is a possibility of technology failure and discussed alternative modes of communication if that failure occurs. ? ?Patient and/or legal guardian expressed understanding and consented to Telemedicine visit: Yes  ? ?Presenting Concerns: ?Patient and/or family reports the following symptoms/concerns: The patient reports that she had been doing well since her last follow up appointment. She shared that she has been having second thoughts about moving into a home with her son, daughter in law and their family. She noted that she was experiencing a weak cell phone signal and was struggling with connection. The patient agreed to be rescheduled to next week.  ?Duration of problem: Years; Severity of problem: moderate ? ?Patient and/or Family's Strengths/Protective Factors: ?Social connections, Concrete supports in place (healthy food, safe environments, etc.), and Sense of purpose ? ?Goals Addressed: ?Patient will: ? Reduce symptoms of: agitation, anxiety, depression, insomnia, and stress  ? Increase knowledge and/or ability of: coping  skills, healthy habits, self-management skills, and stress reduction  ? Demonstrate ability to: Increase healthy adjustment to current life circumstances and Increase adequate support systems for patient/family ? ?Progress towards Goals: ?Ongoing ? ?Interventions: ?Interventions utilized:  Supportive Counseling was utilized by the clinician focusing on the patient's stress during today's follow up session. The clinician processed with the patient how they have been doing since the last follow-up session. Clinician rescheduled the patient's appointment due to the patient's weak telephone signal after several attempts to reconnect.  ?Standardized Assessments completed:  unable to complete today as the patient lost her telephone connection ? ?Plan: ?Follow up with behavioral health clinician on : 06/25/2021 at 11:00 AM  ?Behavioral recommendations:  ?Referral(s): [place] (In Clinic) ? ?I discussed the assessment and treatment plan with the patient and/or parent/guardian. They were provided an opportunity to ask questions and all were answered. They agreed with the plan and demonstrated an understanding of the instructions. ?  ?They were advised to call back or seek an in-person evaluation if the symptoms worsen or if the condition fails to improve as anticipated. ? ?Lesli Albee, LCSWA ?

## 2021-06-25 ENCOUNTER — Ambulatory Visit: Payer: Self-pay | Admitting: Licensed Clinical Social Worker

## 2021-06-26 ENCOUNTER — Telehealth: Payer: Self-pay | Admitting: Licensed Clinical Social Worker

## 2021-06-26 NOTE — Telephone Encounter (Signed)
Called to check in on need for resources. Left number for Ralene Muskrat at [facility] [phone] and the [facility] in [place] [phone]. ?

## 2021-07-02 ENCOUNTER — Ambulatory Visit: Payer: Self-pay | Admitting: Licensed Clinical Social Worker

## 2021-07-02 DIAGNOSIS — F411 Generalized anxiety disorder: Secondary | ICD-10-CM

## 2021-07-02 DIAGNOSIS — F339 Major depressive disorder, recurrent, unspecified: Secondary | ICD-10-CM

## 2021-07-02 DIAGNOSIS — G4700 Insomnia, unspecified: Secondary | ICD-10-CM

## 2021-07-02 NOTE — BH Specialist Note (Signed)
Integrated Behavioral Health via Telemedicine Visit ? ?07/02/2021 ?Delinda Malan ?[id number] ? ?Number of [place] Clinician visits: No data recorded ?Session Start time: No data recorded  ?Session End time: No data recorded ?Total time in minutes: No data recorded ? ?Referring Provider: [name], NP ?Patient/Family location: The patients home address ?[facility] Provider location: [facility] [place] ?All persons participating in visit: [name] and [name], MSW, LCSW-A ?Types of Service: Telephone visit ? ?I connected with [name]  via  Telephone or Video Enabled Telemedicine Application  (Video is Caregility application) and verified that I am speaking with the correct person using two identifiers. Discussed confidentiality: Yes  ? ?I discussed the limitations of telemedicine and the availability of in person appointments. Discussed there is a possibility of technology failure and discussed alternative modes of communication if that failure occurs. ? ?Patient and/or legal guardian expressed understanding and consented to Telemedicine visit: Yes  ? ?Presenting Concerns: ?Patient and/or family reports the following symptoms/concerns: The patient reports that she has been doing okay since her last follow-up appointment. She shared that she has been living with her granddaughter's biological mother. She noted that this is an awkward situation at times. She explained that her mother left her when she was 51 years old and she knows how it feels to grow up without a mother. She noted the her 6 year old granddaughter has been with her since she was three months old and she often feels like she is over compensating for this. The patient discussed other familial and financial stressors impacting her life. She noted the lack of child care for her 16 year old granddaughter has been a barrier to her working. She shared that she continues to struggle to fall asleep  often taking in excess of two hours. Additionally, the patient reported that she wakes up 2-3 times per night due to leg and hip pain and the need to stretch and move her legs. She explained that she tries to remain mindful of good sleep practices, but does not have her own bed or home at the moment and must follow others house rules. The patient stated that she is making progress calling and applying for community resources. Rylan denied any suicidal or homicidal thoughts.  ?Duration of problem: Years; Severity of problem: moderate ? ?Patient and/or Family's Strengths/Protective Factors: ?Social connections, Concrete supports in place (healthy food, safe environments, etc.), and Sense of purpose ? ?Goals Addressed: ?Patient will: ? Reduce symptoms of: agitation, anxiety, depression, insomnia, and stress  ? Increase knowledge and/or ability of: coping skills, healthy habits, self-management skills, and stress reduction  ? Demonstrate ability to: Increase healthy adjustment to current life circumstances and Increase adequate support systems for patient/family ? ?Progress towards Goals: ?Ongoing ? ?Interventions: ?Interventions utilized:  CBT Cognitive Behavioral Therapy was utilized by the clinician during today's follow up session. Clinician met with patient to identify needs related to stressors and functioning, and assess and monitor for signs and symptoms of anxiety and depression, and assess safety. The clinician processed with the patient how they have been doing since the last follow-up session. Clinician measured the patient's anxiety on a numerical scale. Clinician assessed the patients pattern of sleep, bedtime routines, activities associated with the bed, activity and energy level while awake, night time snacking, stimulant use, daytime napping, total sleep amounts. Clinician explored the patients thoughts and associated emotions regarding sleep. Clinician intervened with positive regard and optimism to  validate client's emotions, and supported  client in exploring ways access community services to address her [facility] needs. The session ended with scheduling. ?Standardized Assessments completed: GAD-7 and PHQ 9 ?GAD-7 = 15 ?PHQ-9 = 13 ? ?Assessment: ?Patient currently experiencing see above.  ? ?Patient may benefit from see above. ? ?Plan: ?Follow up with behavioral health clinician on : 07/17/2021 at 2:00 PM  ?Behavioral recommendations:  ?Referral(s): [place] (In Clinic) ? ?I discussed the assessment and treatment plan with the patient and/or parent/guardian. They were provided an opportunity to ask questions and all were answered. They agreed with the plan and demonstrated an understanding of the instructions. ?  ?They were advised to call back or seek an in-person evaluation if the symptoms worsen or if the condition fails to improve as anticipated. ? ?Lesli Albee, LCSWA ?

## 2021-07-17 ENCOUNTER — Ambulatory Visit: Payer: Self-pay | Admitting: Licensed Clinical Social Worker

## 2021-07-17 ENCOUNTER — Telehealth: Payer: Self-pay | Admitting: Licensed Clinical Social Worker

## 2021-07-17 NOTE — Telephone Encounter (Signed)
Called the patient twice during today's scheduled appointment;no answer. I left a voicemail the on the first attempt, and was unable to leave a voicemail on the second attempt because the mailbox was full. ?

## 2021-07-17 NOTE — Telephone Encounter (Signed)
Called to check in regarding resources and housing. Left [facility] number for [facility] the [facility] as well as the [facility] center for housing and employment assistance.  ?

## 2021-08-20 ENCOUNTER — Telehealth: Payer: Self-pay | Admitting: Licensed Clinical Social Worker

## 2021-08-20 NOTE — Telephone Encounter (Signed)
Called the patient to reschedule the her missed IBH follow-up appointment. Left a voicemail with contact information for [facility] ?

## 2021-08-26 ENCOUNTER — Other Ambulatory Visit: Payer: Self-pay

## 2021-08-26 ENCOUNTER — Encounter (HOSPITAL_COMMUNITY): Payer: Self-pay | Admitting: Emergency Medicine

## 2021-08-26 ENCOUNTER — Emergency Department (HOSPITAL_COMMUNITY)
Admission: EM | Admit: 2021-08-26 | Discharge: 2021-08-26 | Disposition: A | Discharge status: Home / Self Care | Payer: Self-pay | Attending: Emergency Medicine | Admitting: Emergency Medicine

## 2021-08-26 DIAGNOSIS — G4489 Other headache syndrome: Secondary | ICD-10-CM | POA: Insufficient documentation

## 2021-08-26 DIAGNOSIS — E876 Hypokalemia: Secondary | ICD-10-CM | POA: Insufficient documentation

## 2021-08-26 DIAGNOSIS — R42 Dizziness and giddiness: Secondary | ICD-10-CM | POA: Insufficient documentation

## 2021-08-26 LAB — URINALYSIS, ROUTINE W REFLEX MICROSCOPIC
Bilirubin Urine: NEGATIVE
Glucose, UA: NEGATIVE mg/dL
Ketones, ur: 5 mg/dL — AB
Nitrite: NEGATIVE
Protein, ur: NEGATIVE mg/dL
Specific Gravity, Urine: 1.006 (ref 1.005–1.030)
pH: 6 (ref 5.0–8.0)

## 2021-08-26 LAB — CBC
HCT: 34.8 % — ABNORMAL LOW (ref 36.0–46.0)
Hemoglobin: 11.2 g/dL — ABNORMAL LOW (ref 12.0–15.0)
MCH: 26.6 pg (ref 26.0–34.0)
MCHC: 32.2 g/dL (ref 30.0–36.0)
MCV: 82.7 fL (ref 80.0–100.0)
Platelets: 266 10*3/uL (ref 150–400)
RBC: 4.21 MIL/uL (ref 3.87–5.11)
RDW: 14.8 % (ref 11.5–15.5)
WBC: 7 10*3/uL (ref 4.0–10.5)
nRBC: 0 % (ref 0.0–0.2)

## 2021-08-26 LAB — BASIC METABOLIC PANEL
Anion gap: 7 (ref 5–15)
BUN: 7 mg/dL (ref 6–20)
CO2: 24 mmol/L (ref 22–32)
Calcium: 8.9 mg/dL (ref 8.9–10.3)
Chloride: 107 mmol/L (ref 98–111)
Creatinine, Ser: 0.9 mg/dL (ref 0.44–1.00)
GFR, Estimated: >60 mL/min (ref >60)
Glucose, Bld: 91 mg/dL (ref 70–99)
Potassium: 3.4 mmol/L — ABNORMAL LOW (ref 3.5–5.1)
Sodium: 138 mmol/L (ref 135–145)

## 2021-08-26 MED ORDER — PROCHLORPERAZINE EDISYLATE 10 MG/2ML IJ SOLN
10.0000 mg | Freq: Once | INTRAMUSCULAR | Status: AC
  Administered 2021-08-26: 10 mg via INTRAVENOUS
  Filled 2021-08-26: qty 2

## 2021-08-26 MED ORDER — DIPHENHYDRAMINE HCL 50 MG/ML IJ SOLN
12.5000 mg | Freq: Once | INTRAMUSCULAR | Status: AC
Start: 2021-08-26 — End: 2021-08-26
  Administered 2021-08-26: 12.5 mg via INTRAVENOUS
  Filled 2021-08-26: qty 1

## 2021-08-26 MED ORDER — KETOROLAC TROMETHAMINE 30 MG/ML IJ SOLN
15.0000 mg | Freq: Once | INTRAMUSCULAR | Status: AC
  Administered 2021-08-26: 15 mg via INTRAVENOUS
  Filled 2021-08-26: qty 1

## 2021-08-26 NOTE — ED Notes (Signed)
Pt attempting to provide a urine sample.

## 2021-08-26 NOTE — ED Notes (Signed)
Pt ambulated to restroom independently.

## 2021-08-26 NOTE — ED Triage Notes (Signed)
Pt having dizziness with nausea and headache for 2 weeks, no history of Vertigo.

## 2021-08-26 NOTE — ED Provider Notes (Signed)
[facility] EMERGENCY DEPARTMENT Provider Note   CSN: [id number] Arrival date & time: 08/26/21  1734     History {Add pertinent medical, surgical, social history, OB history to HPI:1} Chief Complaint  Patient presents with   Dizziness   Headache    Melissa Dean is a 51 y.o. female.  HPI Patient presents for evaluation of nausea and dizziness with headache for 2 weeks.  But nausea has caused her to not eat much and lose about 9 pounds.  She denies photophobia, neck pain, paresthesia, focal weakness.  She is chronically on medication for depression.  She goes for an outpatient behavioral health center for depression but missed her most recent appointment.  She denies stress at home.  She lives with family members.  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    Home Medications Prior to Admission medications   Medication Sig Start Date End Date Taking? Authorizing Provider  albuterol (VENTOLIN HFA) 108 (90 Base) MCG/ACT inhaler Inhale 2-4 puffs by mouth every 4 hours as needed for wheezing, cough, and/or shortness of breath 01/21/21   Iloabachie, Chioma E, NP  cetirizine (ZYRTEC) 10 MG tablet TAKE ONE TABLET BY MOUTH AT BEDTIME 02/08/20 02/07/21  Abate, Desta A, NP  Fluticasone-Salmeterol (ADVAIR) 250-50 MCG/DOSE AEPB Inhale 1 puff into the lungs 2 (two) times daily. Patient not taking: Reported on 09/18/2020 01/18/20   Abate, Desta A, NP  mirtazapine (REMERON) 15 MG tablet Take 0.5 tablets (7.5 mg total) by mouth at bedtime. 05/27/21   Iloabachie, Chioma E, NP      Allergies    Patient has no known allergies.    Review of Systems   Review of Systems  Physical Exam Updated Vital Signs BP 115/61   Pulse 64   Temp 97.8 F (36.6 C) (Oral)   Resp 10   Ht 5\' 2"  (1.575 m)   Wt 86.7 kg   LMP 08/19/2021 (Approximate)   SpO2 99%   BMI 34.96 kg/m  Physical Exam Vitals and nursing note reviewed.  Constitutional:      Appearance: She is well-developed. She is not ill-appearing.   HENT:     Head: Normocephalic and atraumatic.     Right Ear: External ear normal.     Left Ear: External ear normal.  Eyes:     Conjunctiva/sclera: Conjunctivae normal.     Pupils: Pupils are equal, round, and reactive to light.  Neck:     Trachea: Phonation normal.     Comments: No meningismus. Cardiovascular:     Rate and Rhythm: Normal rate and regular rhythm.     Heart sounds: Normal heart sounds.  Pulmonary:     Effort: Pulmonary effort is normal.     Breath sounds: Normal breath sounds.  Abdominal:     General: There is no distension.     Palpations: Abdomen is soft.     Tenderness: There is no abdominal tenderness.  Musculoskeletal:        General: Normal range of motion.     Cervical back: Normal range of motion and neck supple.  Skin:    General: Skin is warm and dry.  Neurological:     Mental Status: She is alert and oriented to person, place, and time.     Cranial Nerves: No cranial nerve deficit.     Sensory: No sensory deficit.     Motor: No abnormal muscle tone.     Coordination: Coordination normal.     Comments: No dysarthria, aphasia or nystagmus.  Normal  strength times light bilaterally.  Psychiatric:        Mood and Affect: Mood normal. Mood is not anxious or depressed.        Behavior: Behavior normal.        Thought Content: Thought content normal.        Judgment: Judgment normal.    ED Results / Procedures / Treatments   Labs (all labs ordered are listed, but only abnormal results are displayed) Labs Reviewed  BASIC METABOLIC PANEL - Abnormal; Notable for the following components:      Result Value   Potassium 3.4 (*)    All other components within normal limits  CBC - Abnormal; Notable for the following components:   Hemoglobin 11.2 (*)    HCT 34.8 (*)    All other components within normal limits  URINALYSIS, ROUTINE W REFLEX MICROSCOPIC  CBG MONITORING, ED    EKG EKG Interpretation  Date/Time:  Tuesday Aug 26 2021 18:19:05  EDT Ventricular Rate:  62 PR Interval:  147 QRS Duration: 77 QT Interval:  429 QTC Calculation: 436 R Axis:   73 Text Interpretation: Sinus rhythm Probable anteroseptal infarct, old Minimal ST depression, inferior leads Since last tracing No significant change was found Confirmed by Mancel Bale [phone]) on 08/26/2021 6:41:37 PM  Radiology No results found.  Procedures Procedures  {Document cardiac monitor, telemetry assessment procedure when appropriate:1}  Medications Ordered in ED Medications  ketorolac (TORADOL) 30 MG/ML injection 15 mg (has no administration in time range)  prochlorperazine (COMPAZINE) injection 10 mg (has no administration in time range)  diphenhydrAMINE (BENADRYL) injection 12.5 mg (has no administration in time range)    ED Course/ Medical Decision Making/ A&P                           Medical Decision Making Nonspecific headache, nausea and dizziness.  Nonfocal exam.  Ongoing symptoms for 2 weeks.  Doubt CVA.  We will treat with headache cocktail then reassess.  Amount and/or Complexity of Data Reviewed Labs: ordered.  Risk Prescription drug management.     {Document critical care time when appropriate:1} {Document review of labs and clinical decision tools ie heart score, Chads2Vasc2 etc:1}  {Document your independent review of radiology images, and any outside records:1} {Document your discussion with family members, caretakers, and with consultants:1} {Document social determinants of health affecting pt's care:1} {Document your decision making why or why not admission, treatments were needed:1} Final Clinical Impression(s) / ED Diagnoses Final diagnoses:  None    Rx / DC Orders ED Discharge Orders     None

## 2021-08-26 NOTE — Discharge Instructions (Signed)
The testing today did not show any serious problems.  Follow-up with your primary care doctor for further care and treatment as needed.

## 2021-08-26 NOTE — ED Notes (Signed)
Reassessed pt's pain. Pt states that the pain in the back of her head has subsided. She states that she still has a bit of nausea, but she is feeling better

## 2021-10-23 ENCOUNTER — Ambulatory Visit: Payer: Self-pay | Admitting: Gerontology

## 2021-10-23 ENCOUNTER — Other Ambulatory Visit: Payer: Self-pay

## 2021-10-23 DIAGNOSIS — Z1231 Encounter for screening mammogram for malignant neoplasm of breast: Secondary | ICD-10-CM

## 2021-10-28 ENCOUNTER — Other Ambulatory Visit: Payer: Self-pay

## 2021-10-28 ENCOUNTER — Encounter: Payer: Self-pay | Admitting: *Deleted

## 2021-10-28 ENCOUNTER — Ambulatory Visit: Payer: Self-pay | Attending: Hematology and Oncology | Admitting: *Deleted

## 2021-10-28 ENCOUNTER — Ambulatory Visit
Admission: RE | Admit: 2021-10-28 | Discharge: 2021-10-28 | Disposition: A | Discharge status: Home / Self Care | Payer: Self-pay | Source: Ambulatory Visit | Attending: Obstetrics and Gynecology | Admitting: Obstetrics and Gynecology

## 2021-10-28 VITALS — BP 134/79 | Wt 186.9 lb

## 2021-10-28 DIAGNOSIS — Z1231 Encounter for screening mammogram for malignant neoplasm of breast: Secondary | ICD-10-CM | POA: Insufficient documentation

## 2021-10-28 DIAGNOSIS — Z01419 Encounter for gynecological examination (general) (routine) without abnormal findings: Secondary | ICD-10-CM

## 2021-10-28 DIAGNOSIS — Z1211 Encounter for screening for malignant neoplasm of colon: Secondary | ICD-10-CM

## 2021-10-28 NOTE — Progress Notes (Signed)
Melissa Dean is a 51 y.o. female who presents to [facility] clinic today with no complaints. Patient presents for clinical breast exam and mammogram..    Pap Smear: Pap not smear completed today. Last Pap smear was on 05/26/17 at the [facility] clinic and was  negative / negative . Per patient has no history of an abnormal Pap smear. Last Pap smear result is available in Epic.   Physical exam: Breasts Breasts symmetrical. No skin abnormalities bilateral breasts. No nipple retraction bilateral breasts. No nipple discharge bilateral breasts. No lymphadenopathy. No lumps palpated bilateral breasts. Bilateral breast have fibroglandular like tissue at 6:00.   Pelvic/Bimanual Pap is not indicated today    Smoking History: Patient has is a former smoker    Patient Navigation: Patient education provided. Taught self breast awareness.  Access to services provided for patient through [facility] program. No interpreter provided. No transportation provided   Colorectal Cancer Screening: Per patient has never had colonoscopy completed. No complaints today. FIT test given with instructions.   Breast and Cervical Cancer Risk Assessment: Patient does not have family history of breast cancer, known genetic mutations, or radiation treatment to the chest before age 10. Patient does not have history of cervical dysplasia, immunocompromised, or DES exposure in-utero.  Risk Assessment     Risk Scores       10/28/2021 03/20/2020   Last edited by: Narda Rutherford, LPN Scarlett Presto, RN   5-year risk: 1.6 % 1.7 %   Lifetime risk: 12.5 % 12.8 %            A: BCCCP exam without pap smear   P: Referred patient to [facility] for a screening mammogram. Appointment scheduled today.  Jim Like, RN 10/28/2021 1:08 PM

## 2021-11-19 ENCOUNTER — Telehealth: Payer: Self-pay

## 2021-11-26 ENCOUNTER — Ambulatory Visit: Payer: Self-pay | Admitting: Licensed Clinical Social Worker

## 2021-11-26 ENCOUNTER — Other Ambulatory Visit: Payer: Self-pay | Admitting: Gerontology

## 2021-11-26 DIAGNOSIS — G4700 Insomnia, unspecified: Secondary | ICD-10-CM

## 2021-11-26 DIAGNOSIS — F339 Major depressive disorder, recurrent, unspecified: Secondary | ICD-10-CM

## 2021-11-26 DIAGNOSIS — F411 Generalized anxiety disorder: Secondary | ICD-10-CM

## 2021-11-26 NOTE — BH Specialist Note (Signed)
Integrated Behavioral Health Follow Up Telephone Visit  MRN: [id number] Name: Melissa Dean   Types of Service: Telephone visit  Interpretor:No. Interpretor Name and Language: N/A  Subjective: Melissa Dean is a 51 y.o. female accompanied by  herself Patient was referred by [name], NP  for mental health. Patient reports the following symptoms/concerns: The patient reports that she has been doing okay since her last follow-up appointment. She discussed the changes in her living arrangements over the last couple of months and noted that her granddaughter, whom she was the guardian of for four years, has moved with her father. She discussed feelings of loss and loneliness since the child has been gone. She endorsed having passive suicidal thoughts without a plan or access to means to carry out a plan. She shared that she does not want to end her life; she would not want to cause her family any pain. Rubyann denied any homicidal thoughts. The patient said she would contact her adult daughter and go to the nearest emergency room or [facility] if these thoughts worsened. The patient explained that she is back to work now in a better location and is working to find stable housing close to her grandchildren.  Duration of problem: Years; Severity of problem: moderate  Objective: Mood: Depressed and Affect: Appropriate Risk of harm to self or others: Suicidal ideation  Life Context: Family and Social: see above School/Work: see above Self-Care: see above Life Changes: see above  Patient and/or Family's Strengths/Protective Factors: Social connections, Concrete supports in place (healthy food, safe environments, etc.), and Sense of purpose  Goals Addressed: Patient will:  Reduce symptoms of: agitation, anxiety, depression, insomnia, and stress   Increase knowledge and/or ability of: coping skills, healthy habits, self-management skills, and stress reduction   Demonstrate ability to:  Increase healthy adjustment to current life circumstances  Progress towards Goals: Ongoing  Interventions: Interventions utilized:  CBT Cognitive Behavioral Therapywas utilized by the clinician during today's follow up session. Clinician met with patient to identify needs related to stressors and functioning, and assess and monitor for signs and symptoms of anxiety and depression, and assess safety. The clinician processed with the patient how they have been doing since the last follow-up session. Clinician introduced distress tolerance skills to the patient and explained that negative emotions will usually lessen in intensity and pass over time. The Clinician determined that the patient is competent, thinking clearly, orientated x 3, able to make medical decisions, and is not acutely suicidal. Still, if she experienced worsening symptoms, the Clinician informed her on how to access emergency crisis services. The session ended with scheduling.   Standardized Assessments completed: GAD-7 and PHQ 9 GAD-7=20 PHQ-9=17   Assessment: Patient currently experiencing see above.   Patient may benefit from see above.  Plan: Follow up with behavioral health clinician on :  Behavioral recommendations:  Referral(s): [place] (In Clinic) "From scale of 1-10, how likely are you to follow plan?":   Lesli Albee, LCSWA

## 2021-12-04 ENCOUNTER — Ambulatory Visit: Payer: Self-pay | Admitting: Licensed Clinical Social Worker

## 2021-12-11 ENCOUNTER — Ambulatory Visit: Payer: Self-pay | Admitting: Licensed Clinical Social Worker

## 2021-12-11 DIAGNOSIS — G4700 Insomnia, unspecified: Secondary | ICD-10-CM

## 2021-12-11 DIAGNOSIS — F411 Generalized anxiety disorder: Secondary | ICD-10-CM

## 2021-12-11 DIAGNOSIS — F339 Major depressive disorder, recurrent, unspecified: Secondary | ICD-10-CM

## 2021-12-11 NOTE — BH Specialist Note (Signed)
Integrated Behavioral Health via Telemedicine Visit  12/11/2021 Melissa Dean [id number]  Number of [place] Clinician visits: No data recorded Session Start time: No data recorded  Session End time: No data recorded Total time in minutes: No data recorded  Referring Provider: [name], NP Patient/Family location: The Patient's Home  [facility] Provider location: [facility] All persons participating in visit: Melissa Dean and Melissa Cairo, LCSW-A Types of Service: Telephone visit  I connected with Melissa Dean via  Telephone or Video Enabled Telemedicine Application  (Video is Caregility application) and verified that I am speaking with the correct person using two identifiers. Discussed confidentiality: Yes   I discussed the limitations of telemedicine and the availability of in person appointments.  Discussed there is a possibility of technology failure and discussed alternative modes of communication if that failure occurs.  I discussed that engaging in this telemedicine visit, they consent to the provision of behavioral healthcare and the services will be billed under their insurance.  Patient and/or legal guardian expressed understanding and consented to Telemedicine visit: Yes   Presenting Concerns: Patient and/or family reports the following symptoms/concerns: The patient reports that she has been doing okay since her last follow-up appointment. The patient reports that she has been struggling with increasing sleep disturbances, but attributes this to changing work schedule and sleeping on a couch. The patient shared that she has not been able to secure housing and continues to stay with her granddaughters mother. Melissa Dean explained that she is not able to make enough on her own to afford current rent prices and is considering looking for a roommate. The patient stated that overall she is coping with the stress by spending time with  animals outdoors. The patient denied any suicidal or homicidal thoughts.  Duration of problem: Years; Severity of problem: moderate  Patient and/or Family's Strengths/Protective Factors: Social connections, Sense of purpose, and Physical Health (exercise, healthy diet, medication compliance, etc.)  Goals Addressed: Patient will:  Reduce symptoms of: agitation, anxiety, depression, insomnia, mood instability, and stress   Increase knowledge and/or ability of: coping skills, healthy habits, self-management skills, and stress reduction   Demonstrate ability to: Increase healthy adjustment to current life circumstances  Progress towards Goals: Ongoing  Interventions: Interventions utilized:  CBT Cognitive Behavioral Therapywas utilized by the clinician during today's follow up session. Clinician met with patient to identify needs related to stressors and functioning, and assess and monitor for signs and symptoms of anxiety and depression, and assess safety. The clinician processed with the patient how they have been doing since the last follow-up session.  Clinician intervened with positive regard and optimism to validate client's emotions, and supported client in exploring ways to increase her social support system. The session ended with scheduling.  Standardized Assessments completed:  Due to time constraints will complete at follow-up   Assessment: Patient currently experiencing see above.   Patient may benefit from see above.  Plan: Follow up with behavioral health clinician on : 12/18/2021 at 11:00 AM  Behavioral recommendations:  Referral(s): [place] (In Clinic)  I discussed the assessment and treatment plan with the patient and/or parent/guardian. They were provided an opportunity to ask questions and all were answered. They agreed with the plan and demonstrated an understanding of the instructions.   They were advised to call back or seek an  in-person evaluation if the symptoms worsen or if the condition fails to improve as anticipated.  Melissa Dean, LCSWA

## 2021-12-16 ENCOUNTER — Other Ambulatory Visit: Payer: Self-pay | Admitting: Gerontology

## 2021-12-16 DIAGNOSIS — G4700 Insomnia, unspecified: Secondary | ICD-10-CM

## 2021-12-16 MED ORDER — MIRTAZAPINE 15 MG PO TABS: ORAL_TABLET | ORAL | 0 refills | Status: DC

## 2021-12-18 ENCOUNTER — Ambulatory Visit: Payer: Self-pay | Admitting: Licensed Clinical Social Worker

## 2021-12-18 DIAGNOSIS — F339 Major depressive disorder, recurrent, unspecified: Secondary | ICD-10-CM

## 2021-12-18 DIAGNOSIS — F411 Generalized anxiety disorder: Secondary | ICD-10-CM

## 2021-12-18 DIAGNOSIS — G4700 Insomnia, unspecified: Secondary | ICD-10-CM

## 2021-12-18 NOTE — BH Specialist Note (Signed)
Integrated Behavioral Health via Telemedicine Visit  12/18/2021 Melissa Dean [id number]  Number of [place] Clinician visits: No data recorded Session Start time: No data recorded  Session End time: No data recorded Total time in minutes: No data recorded  Referring Provider: [name], NP  Patient/Family location: The Patient's Home  [facility] Provider location: [facility] All persons participating in visit: Melissa Dean and Melissa Cairo, LCSW-A Types of Service: Telephone visit  I connected with Melissa Dean via  Telephone or Video Enabled Telemedicine Application  (Video is Caregility application) and verified that I am speaking with the correct person using two identifiers. Discussed confidentiality: Yes   I discussed the limitations of telemedicine and the availability of in person appointments.  Discussed there is a possibility of technology failure and discussed alternative modes of communication if that failure occurs.  I discussed that engaging in this telemedicine visit, they consent to the provision of behavioral healthcare and the services will be billed under their insurance.  Patient and/or legal guardian expressed understanding and consented to Telemedicine visit: Yes   Presenting Concerns: Patient and/or family reports the following symptoms/concerns: The patient reported that she has been doing okay since her last follow-up visit. Melissa Dean stated that she continues to stay with her granddaughter's mother while she works toward getting back on her feet. She discussed financial and other situational stressors that are impacting her life currently. The patient noted that she takes mirtazapine 7.5 mg and melatonin 10 mg at bedtime nightly. She explained that due to her second shift work schedule, she usually goes to bed from 3 a.m. to 4 a.m. She reports that, on average, it takes her 2 hours to fall asleep once she lays down.  Melissa Dean said that she sleeps well and does not wake up often once she is asleep. The patient noted that she occasionally has very vivid and troubling dreams. She provided an example of dreaming that her ex-husband was in [place], and when she awoke, she felt panicked and went to check social media to see if it was true. Melissa Dean stated that she has tried to sleep with the T.V. on, with the T.V. off, and with meditation sounds, and none of those changes seemed to help. The patient endorsed experiencing low energy levels during the day and feeling tired but denied napping. 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 She stated that she went home and took a sick day. Melissa Dean denied any suicidal or homicidal thoughts. Duration of problem: Years; Severity of problem: moderate  Patient and/or Family's Strengths/Protective Factors: Concrete supports in place (healthy food, safe environments, etc.), Sense of purpose, and Physical Health (exercise, healthy diet, medication compliance, etc.)  Goals Addressed: Patient will:  Reduce symptoms of: agitation, anxiety, depression, insomnia, and stress   Increase knowledge and/or ability of: coping skills, healthy habits, self-management skills, and stress reduction   Demonstrate ability to: Increase healthy adjustment to current life circumstances and Increase adequate support systems for patient/family  Progress towards Goals: Ongoing  Interventions: Interventions utilized:  CBT Cognitive Behavioral Therapywas utilized by the clinician during today's follow up session. Clinician met with patient to identify needs related to stressors and functioning, and assess and monitor for signs and symptoms of anxiety and depression, and assess safety. The clinician processed with the  patient how they  have been doing since the last follow-up session. The clinician measured the patient's anxiety and depression on the numerical scale.  Clinician assessed the patients pattern of sleep, bedtime routines, activities associated with the bed, activity and energy level while awake, night time snacking, stimulant use, daytime napping, total sleep amounts. Clinician explored the patients thoughts and associated emotions regarding sleep.The session ended with scheduling.  Standardized Assessments completed: GAD-7 and PHQ 9 GAD-7=20 PHQ-9=15   Assessment: Patient currently experiencing see above.   Patient may benefit from see above.  Plan: Follow up with behavioral health clinician on : Thursday, December 25, 2021 at 10 AM Behavioral recommendations:  Referral(s): [place] (In Clinic)  I discussed the assessment and treatment plan with the patient and/or parent/guardian. They were provided an opportunity to ask questions and all were answered. They agreed with the plan and demonstrated an understanding of the instructions.   They were advised to call back or seek an in-person evaluation if the symptoms worsen or if the condition fails to improve as anticipated.  Lesli Albee, LCSWA

## 2021-12-25 ENCOUNTER — Ambulatory Visit: Payer: Self-pay | Admitting: Licensed Clinical Social Worker

## 2021-12-28 NOTE — Telephone Encounter (Signed)
Called the patient to offer resources as discussed in previous follow-up; no answer unable to leave vm as mailbox was full.

## 2022-01-01 ENCOUNTER — Ambulatory Visit: Payer: Self-pay | Admitting: Licensed Clinical Social Worker

## 2022-01-01 DIAGNOSIS — F339 Major depressive disorder, recurrent, unspecified: Secondary | ICD-10-CM

## 2022-01-01 DIAGNOSIS — G4700 Insomnia, unspecified: Secondary | ICD-10-CM

## 2022-01-01 DIAGNOSIS — F411 Generalized anxiety disorder: Secondary | ICD-10-CM

## 2022-01-01 NOTE — BH Specialist Note (Signed)
Integrated Behavioral Health via Telemedicine Visit  01/01/2022 Melissa Dean [id number]  Number of [place] Clinician visits: No data recorded Session Start time: No data recorded  Session End time: No data recorded Total time in minutes: No data recorded  Referring Provider: [name], NP  Patient/Family location: The Patient's Address [facility] Provider location: [facility]  All persons participating in visit: Jlyn Cerros and [name], LCSW-A Types of Service: Telephone visit  I connected with [name] via  Telephone or Video Enabled Telemedicine Application  (Video is Caregility application) and verified that I am speaking with the correct person using two identifiers. Discussed confidentiality: Yes   I discussed the limitations of telemedicine and the availability of in person appointments.  Discussed there is a possibility of technology failure and discussed alternative modes of communication if that failure occurs.  Patient and/or legal guardian expressed understanding and consented to Telemedicine visit: Yes   Presenting Concerns: Patient and/or family reports the following symptoms/concerns: The patient reports that she has been doing okay since her last follow up visit. She endorsed experiencing one panic attack since her last follow up appointment. Patient shared that she has a lot of friends but does not hang out with them anymore. She noted that in her free time she prefers to stay home or spend time with her granddaughter. The patient noted that she has been thinking about what she wants in the future and has decided to go to [employer] and enroll in [place] classes. The patient discussed concerns regarding her age and returning to school and difficulties with sleep. She noted that she struggles with worrying even over issues in her life that have improved or been resolved. She stated that she has a lot of  racing thoughts during times when she is still such as going to bed. The patient denied any suicidal or homicidal thoughts.  Duration of problem: Years; Severity of problem: moderate  Patient and/or Family's Strengths/Protective Factors: Social connections, Concrete supports in place (healthy food, safe environments, etc.), Sense of purpose, and Physical Health (exercise, healthy diet, medication compliance, etc.)  Goals Addressed: Patient will:  Reduce symptoms of: agitation, anxiety, depression, insomnia, and stress   Increase knowledge and/or ability of: coping skills, healthy habits, self-management skills, and stress reduction   Demonstrate ability to: Increase healthy adjustment to current life circumstances and Increase adequate support systems for patient/family  Progress towards Goals: Ongoing  Interventions: Interventions utilized:  CBT Cognitive Behavioral Therapywas utilized by the clinician during today's follow up session. Clinician met with patient to identify needs related to stressors and functioning, and assess and monitor for signs and symptoms of anxiety and depression, and assess safety. The clinician processed with the patient how they have been doing since the last follow-up session. Clinician measured the patient's anxiety on a numerical scale. Clinician worked with the patient to practice and implement a thought-stopping technique (thinking of a stop sign and then a pleasant scene) for worries that have been addressed but persist; and encourage the client's use of the technique in daily life between sessions. The session ended with scheduling.  Standardized Assessments completed: GAD-7 and PHQ 9 GAD-7= 18 PHQ-9= 16  Assessment: Patient currently experiencing see above.   Patient may benefit from see above.  Plan: Follow up with behavioral health clinician on : Thursday January 08, 2022 at 3:00 PM. Behavioral recommendations:  Referral(s): [place] (In Clinic)  I discussed the assessment and treatment  plan with the patient and/or parent/guardian. They were provided an opportunity to ask questions and all were answered. They agreed with the plan and demonstrated an understanding of the instructions.   They were advised to call back or seek an in-person evaluation if the symptoms worsen or if the condition fails to improve as anticipated.  Lesli Albee, LCSWA

## 2022-01-08 ENCOUNTER — Ambulatory Visit: Payer: Self-pay | Admitting: Licensed Clinical Social Worker

## 2022-01-15 ENCOUNTER — Ambulatory Visit: Payer: Self-pay | Admitting: Licensed Clinical Social Worker

## 2022-01-15 ENCOUNTER — Telehealth: Payer: Self-pay | Admitting: Licensed Clinical Social Worker

## 2022-01-15 NOTE — Telephone Encounter (Signed)
Called the patient during today's scheduled appointment time; pt answered and noted she was driving and forgot about her appointment. Clinician informed the patient that she could not have a session while the pt was driving and could not reschedule her appointment at this time because she needed to return eligibly paperwork to the clinic. Clinician encouraged the patient to bring in her eligibility documents and reschedule  today's appointment or call to request referrals to another provider. Patient agreed.

## 2022-01-21 ENCOUNTER — Other Ambulatory Visit: Payer: Self-pay | Admitting: Gerontology

## 2022-01-21 DIAGNOSIS — G4700 Insomnia, unspecified: Secondary | ICD-10-CM

## 2022-02-11 ENCOUNTER — Ambulatory Visit: Payer: Self-pay | Admitting: Licensed Clinical Social Worker

## 2022-02-11 ENCOUNTER — Other Ambulatory Visit: Payer: Self-pay | Admitting: Gerontology

## 2022-02-11 DIAGNOSIS — F411 Generalized anxiety disorder: Secondary | ICD-10-CM

## 2022-02-11 DIAGNOSIS — G4700 Insomnia, unspecified: Secondary | ICD-10-CM

## 2022-02-11 DIAGNOSIS — F331 Major depressive disorder, recurrent, moderate: Secondary | ICD-10-CM

## 2022-02-11 MED ORDER — MIRTAZAPINE 15 MG PO TABS: ORAL_TABLET | ORAL | 0 refills | Status: DC

## 2022-02-11 NOTE — Telephone Encounter (Signed)
Called pt to make appt and pt needed refill. Appt made and refill sent over.

## 2022-02-11 NOTE — BH Specialist Note (Signed)
Integrated Behavioral Health via Telemedicine Visit  02/11/2022 Melissa Dean [id number]  Number of [place] Clinician visits: No data recorded Session Start time: No data recorded  Session End time: No data recorded Total time in minutes: No data recorded  Referring Provider: [name], NP Patient/Family location: The patient's home [facility] Provider location: [facility] All persons participating in visit: Melissa Dean and Melissa Cairo, LCSW-A Types of Service: Individual psychotherapy  I connected with Melissa Dean via  Telephone or Video Enabled Telemedicine Application  (Video is Caregility application) and verified that I am speaking with the correct person using two identifiers. Discussed confidentiality: Yes   I discussed the limitations of telemedicine and the availability of in person appointments.  Discussed there is a possibility of technology failure and discussed alternative modes of communication if that failure occurs.  Patient and/or legal guardian expressed understanding and consented to Telemedicine visit: Yes   Presenting Concerns: Patient and/or family reports the following symptoms/concerns: The patient reports that she has been doing better since her last follow-up appointment. 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  The patient noted that her new job is keeping her very busy which she feels is helpful.  Melissa Dean stated that she has been using a notebook to help her keep track of all of her duties and responsibilities.  The patient shared that she did not have plans yet for Thanksgiving next week because she is unsure of her work schedule.  The patient discussed an increase in situational stressors that are impacting her life, but noted that she is managing the additional stress by trying to remain positive.  Melissa Dean noted that  she picked up her prescription for mirtazapine and has been taking 7.5 mg before bedtime.  The patient stated that she falls asleep within an hour or less and feels like she is getting more sleep overall.  Melissa Dean denied any suicidal or homicidal thoughts. Duration of problem: Years; Severity of problem: moderate  Patient and/or Family's Strengths/Protective Factors: Concrete supports in place (healthy food, safe environments, etc.), Sense of purpose, and Physical Health (exercise, healthy diet, medication compliance, etc.)  Goals Addressed: Patient will:  Reduce symptoms of: agitation, anxiety, depression, insomnia, and stress   Increase knowledge and/or ability of: coping skills, healthy habits, self-management skills, and stress reduction   Demonstrate ability to: Increase healthy adjustment to current life circumstances  Progress towards Goals: Ongoing  Interventions: Interventions utilized:  CBT Cognitive Behavioral Therapywas utilized by the clinician during today's follow up session. Clinician met with patient to identify needs related to stressors and functioning, and assess and monitor for signs and symptoms of anxiety and depression, and assess safety. The clinician processed with the patient how they have been doing since the last follow-up session.  Clinician measured the patient's anxiety and depression on the numerical scale.  Clinician monitored the patient's use of her psychotropic medication, mirtazapine 7.5 mg; and determined that the patient is taking the medication exactly as prescribed and denied any adverse side effects. Clinician intervened with positive regard and optimism to validate client's emotions, and supported client in exploring ways to increase the patient's supports.  Clinician offered to connect the patient with Melissa Dean, social determinants of health coordinator [facility] for assistance with connecting to community resources.  Clinician encouraged  the patient to continue to use her coping skills to deal with  her current life circumstances.  The session ended with scheduling. Standardized Assessments completed: GAD-7 and PHQ 9 GAD-7=16 PHQ-9= 15   Assessment: Patient currently experiencing see above.   Patient may benefit from see above.  Plan: Follow up with behavioral health clinician on : March 04, 2022 at 9:00 AM. Behavioral recommendations:  Referral(s): Melissa Dean (In Clinic)  I discussed the assessment and treatment plan with the patient and/or parent/guardian. They were provided an opportunity to ask questions and all were answered. They agreed with the plan and demonstrated an understanding of the instructions.   They were advised to call back or seek an in-person evaluation if the symptoms worsen or if the condition fails to improve as anticipated.  Melissa Dean, LCSWA

## 2022-02-18 ENCOUNTER — Ambulatory Visit: Payer: Self-pay | Admitting: Licensed Clinical Social Worker

## 2022-02-18 DIAGNOSIS — F331 Major depressive disorder, recurrent, moderate: Secondary | ICD-10-CM

## 2022-02-18 DIAGNOSIS — F411 Generalized anxiety disorder: Secondary | ICD-10-CM

## 2022-02-18 DIAGNOSIS — G4700 Insomnia, unspecified: Secondary | ICD-10-CM

## 2022-02-18 NOTE — BH Specialist Note (Signed)
Integrated Behavioral Health via Telemedicine Visit  02/18/2022 Melissa Dean [id number]  Number of [place] Clinician visits: No data recorded Session Start time: No data recorded  Session End time: No data recorded Total time in minutes: No data recorded  Referring Provider: [name], NP Patient/Family location: The patient's home  [facility] Provider location: [facility] All persons participating in visit: Zuriah Bordas and [name], LCSW-A Types of Service: Telephone visit  I connected with Melissa Dean  via  Telephone or Video Enabled Telemedicine Application  (Video is Caregility application) and verified that I am speaking with the correct person using two identifiers. Discussed confidentiality: Yes   I discussed the limitations of telemedicine and the availability of in person appointments.  Discussed there is a possibility of technology failure and discussed alternative modes of communication if that failure occurs.  Patient and/or legal guardian expressed understanding and consented to Telemedicine visit: Yes   Presenting Concerns: Patient and/or family reports the following symptoms/concerns: The patient reports that she has been doing well since her last follow-up visit.  Dhriti stated that she was able to pick up her refill of mirtazapine on November 9th and has been taking 7.5 mg nightly.  She shared that she feels the mirtazapine helps her to fall asleep and improve her mood; she denied any adverse reaction or side effects.  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  Julliette shared that she continues to struggle with the transition of her granddaughter not living with her; however, she finds staying busy at work is helping to keep her mind off of things.  The patient explained that she is looking forward to finding her on  apartment soon and being able to have her own space.  Aneth denied any suicidal or homicidal thoughts. Duration of problem: Years; Severity of problem: moderate  Patient and/or Family's Strengths/Protective Factors: Social connections, Concrete supports in place (healthy food, safe environments, etc.), Sense of purpose, and Physical Health (exercise, healthy diet, medication compliance, etc.)  Goals Addressed: Patient will:  Reduce symptoms of: agitation, anxiety, depression, insomnia, and stress   Increase knowledge and/or ability of: coping skills, healthy habits, self-management skills, and stress reduction   Demonstrate ability to: Increase healthy adjustment to current life circumstances  Progress towards Goals: Ongoing  Interventions: Interventions utilized:  CBT Cognitive Behavioral Therapy was utilized by the clinician during today's follow up session. Clinician met with patient to identify needs related to stressors and functioning, and assess and monitor for signs and symptoms of anxiety and depression, and assess safety. The clinician processed with the patient how they have been doing since the last follow-up session. Clinician measured the patient's anxiety and depression on a numerical scale. The Clinician monitored the patient's compliance with psychotropic medication prescribed by her primary care provider, noting that the patient reported taking mirtazapine 7.5 mg before bedtime exactly as prescribed and the effectiveness of the medication on the patient's level of functioning was noted to improve. Clinician continued to teach the patient calming relaxation and mindfulness skills and how to discriminate better between relaxation and tension and taught the client how to apply the skills in their daily life. Clinician assigned the patient homework in which they will use mindfulness skills daily gradually applying them progressively from not anxiety provoking to anxiety provoking  situations and worked with the patient to resolve obstacles toward  sustained implementation.  The session ended with scheduling. Standardized Assessments completed: GAD-7 and PHQ 9   Assessment: Patient currently experiencing see above.   Patient may benefit from see above.  Plan: Follow up with behavioral health clinician on :  Behavioral recommendations:  Referral(s): [place] (In Clinic)  I discussed the assessment and treatment plan with the patient and/or parent/guardian. They were provided an opportunity to ask questions and all were answered. They agreed with the plan and demonstrated an understanding of the instructions.   They were advised to call back or seek an in-person evaluation if the symptoms worsen or if the condition fails to improve as anticipated.  Lesli Albee, LCSWA

## 2022-03-03 ENCOUNTER — Other Ambulatory Visit: Payer: Self-pay

## 2022-03-03 ENCOUNTER — Ambulatory Visit: Payer: Self-pay | Admitting: Gerontology

## 2022-03-03 ENCOUNTER — Encounter: Payer: Self-pay | Admitting: Gerontology

## 2022-03-03 VITALS — BP 124/83 | HR 74 | Wt 198.0 lb

## 2022-03-03 DIAGNOSIS — Z09 Encounter for follow-up examination after completed treatment for conditions other than malignant neoplasm: Secondary | ICD-10-CM | POA: Insufficient documentation

## 2022-03-03 DIAGNOSIS — Z8719 Personal history of other diseases of the digestive system: Secondary | ICD-10-CM

## 2022-03-03 MED ORDER — PANTOPRAZOLE SODIUM 40 MG PO TBEC
80.0000 mg | DELAYED_RELEASE_TABLET | Freq: Every day | ORAL | 2 refills | Status: DC
  Filled 2022-03-03: qty 60, 30d supply, fill #0
  Filled 2022-04-08: qty 60, 30d supply, fill #1
  Filled 2022-05-07 (×2): qty 60, 30d supply, fill #2

## 2022-03-03 NOTE — Progress Notes (Signed)
Established Patient Office Visit  Subjective   Patient ID: Melissa Dean, female    DOB: 1971/02/14  Age: 51 y.o. MRN: [id number]  No chief complaint on file.   HPI  Melissa Dean is a 51 year old female who has history of anemia, anxiety, asthma, bronchitis, COPD, depression presents for routine follow up and medication refill. She states that she's compliant with her medication, denies side effects and continues to make healthy lifestyle changes. She was seen multiple [facility] on 10/11/21, for chest pain, was started on Protonix 80 mg daily which relieves her acid reflux symptoms. Also, she was at  [facility] ED on 02/10/22 for follow up for vaginal bleeding, Estradiol  weekly patch was ordered, pelvic ultrasound done 8/23 during an ED visit showed Heterogeneous appearance of the uterus which is nonspecific but can be seen with multiple small fibroids and/or adenomyosis , and she will follow up with Ob/gyn in 8 weeks.has , currently, she continues to spot for the past 3 weeks and will follow up with her Ob/gyn on 04/09/22. She states that her mood is good, denies suicidal nor homicidal ideation and offers no further complaint.     Patient Active Problem List   Diagnosis Date Noted   Follow-up exam 03/03/2022   History of gastroesophageal reflux (GERD) 03/03/2022   History of edema 11/12/2020   Anxiety and depression 09/18/2020   Weight gain 09/18/2020   Healthcare maintenance 01/25/2018   Housing or economic circumstance 02/10/2017   Acute respiratory failure with hypoxia (HCC) 07/04/2016   COPD (chronic obstructive pulmonary disease) (HCC) 07/04/2016   Acute bronchitis 07/04/2016   Anxiety 07/18/2014   Tobacco abuse 07/18/2014   Abnormal TSH 07/18/2014   Atypical chest pain 07/18/2014   Chest pain 07/06/2014   Past Medical History:  Diagnosis Date   Anemia    Anxiety    Asthma    Bronchitis    COPD (chronic obstructive pulmonary disease) (HCC)    Depression    Past  Surgical History:  Procedure Laterality Date   BREAST BIOPSY Right 05/24/2016   coil marker. PSEUDO-ANGIOMATOUS STROMAL HYPERPLASIA. NEGATIVE FOR ATYPIA   BREAST BIOPSY Right 06/02/2017   ribbon marker.: COLUMNAR CELL CHANGE WITHMICROCALCIFICATIONS. PSEUDO-ANGIOMATOUS STROMAL HYPERPLASIA   CESAREAN SECTION     CESAREAN SECTION     CHOLECYSTECTOMY     TONSILLECTOMY     TUBAL LIGATION     Social History   Tobacco Use   Smoking status: Former    Packs/day: 0.25    Years: 20.00    Total pack years: 5.00    Types: Cigarettes    Quit date: 02/06/2020    Years since quitting: 2.0   Smokeless tobacco: Never  Vaping Use   Vaping Use: Never used  Substance Use Topics   Alcohol use: No    Alcohol/week: 0.0 standard drinks of alcohol   Drug use: No   No Known Allergies    Review of Systems  Constitutional: Negative.   Eyes: Negative.   Respiratory: Negative.    Cardiovascular: Negative.   Gastrointestinal: Negative.   Neurological: Negative.   Psychiatric/Behavioral: Negative.        Objective:     BP 124/83 (BP Location: Right Arm, Patient Position: Sitting, Cuff Size: Large)   Pulse 74   Wt 198 lb (89.8 kg)   BMI 36.21 kg/m  BP Readings from Last 3 Encounters:  03/03/22 124/83  10/28/21 134/79  08/26/21 136/88   Wt Readings from Last 3 Encounters:  03/03/22  198 lb (89.8 kg)  10/28/21 186 lb 14.4 oz (84.8 kg)  08/26/21 191 lb 2 oz (86.7 kg)      Physical Exam HENT:     Head: Normocephalic and atraumatic.  Eyes:     Pupils: Pupils are equal, round, and reactive to light.  Cardiovascular:     Rate and Rhythm: Normal rate and regular rhythm.     Pulses: Normal pulses.     Heart sounds: Normal heart sounds.  Pulmonary:     Effort: Pulmonary effort is normal.     Breath sounds: Normal breath sounds.  Abdominal:     General: Bowel sounds are normal.     Palpations: Abdomen is soft.  Neurological:     General: No focal deficit present.     Mental Status:  She is alert and oriented to person, place, and time. Mental status is at baseline.  Psychiatric:        Mood and Affect: Mood normal.        Behavior: Behavior normal.        Thought Content: Thought content normal.        Judgment: Judgment normal.      No results found for any visits on 03/03/22.  Last CBC Lab Results  Component Value Date   WBC 7.0 08/26/2021   HGB 11.2 (L) 08/26/2021   HCT 34.8 (L) 08/26/2021   MCV 82.7 08/26/2021   MCH 26.6 08/26/2021   RDW 14.8 08/26/2021   PLT 266 08/26/2021   Last metabolic panel Lab Results  Component Value Date   GLUCOSE 91 08/26/2021   NA 138 08/26/2021   K 3.4 (L) 08/26/2021   CL 107 08/26/2021   CO2 24 08/26/2021   BUN 7 08/26/2021   CREATININE 0.90 08/26/2021   GFRNONAA >60 08/26/2021   CALCIUM 8.9 08/26/2021   PROT 7.4 10/28/2020   ALBUMIN 4.0 10/28/2020   LABGLOB 2.5 09/12/2020   AGRATIO 1.6 09/12/2020   BILITOT 0.6 10/28/2020   ALKPHOS 73 10/28/2020   AST 27 10/28/2020   ALT 20 10/28/2020   ANIONGAP 7 08/26/2021   Last lipids Lab Results  Component Value Date   CHOL 163 09/12/2020   HDL 45 09/12/2020   LDLCALC 69 09/12/2020   TRIG 309 (H) 09/12/2020   CHOLHDL 3.0 02/08/2020   Last hemoglobin A1c Lab Results  Component Value Date   HGBA1C 5.3 12/19/2020   Last thyroid functions Lab Results  Component Value Date   TSH 6.090 (H) 12/11/2020   T4TOTAL 6.7 12/19/2020      The 10-year ASCVD risk score (Arnett DK, et al., 2019) is: 1%    Assessment & Plan:   1. Follow-up exam - She was encouraged to follow up with the Ob/Gyn for her vaginal bleeding and to go to the ED for worsening symptoms.  2. History of gastroesophageal reflux (GERD) - She will continue on current medication, -Avoid spicy, fatty and fried food -Avoid sodas and sour juices -Avoid heavy meals -Avoid eating 4 hours before bedtime -Elevate head of bed at night - pantoprazole (PROTONIX) 40 MG tablet; Take 2 tablets (80 mg  total) by mouth daily.  Dispense: 60 tablet; Refill: 2   Return in about 6 months (around 09/01/2022), or if symptoms worsen or fail to improve.    Aneesah Hernan Trellis Paganini, NP

## 2022-03-04 ENCOUNTER — Ambulatory Visit: Payer: Self-pay | Admitting: Licensed Clinical Social Worker

## 2022-03-06 ENCOUNTER — Other Ambulatory Visit: Payer: Self-pay

## 2022-03-10 ENCOUNTER — Other Ambulatory Visit: Payer: Self-pay

## 2022-03-10 DIAGNOSIS — G4700 Insomnia, unspecified: Secondary | ICD-10-CM

## 2022-03-10 MED ORDER — MIRTAZAPINE 15 MG PO TABS
7.5000 mg | ORAL_TABLET | Freq: Every evening | ORAL | 0 refills | Status: DC
  Filled 2022-03-10 – 2022-03-18 (×2): qty 15, 30d supply, fill #0

## 2022-03-10 NOTE — Progress Notes (Signed)
Refill sent In for pt.  

## 2022-03-11 ENCOUNTER — Telehealth: Payer: Self-pay | Admitting: Gerontology

## 2022-03-11 ENCOUNTER — Ambulatory Visit: Payer: Self-pay | Admitting: Licensed Clinical Social Worker

## 2022-03-11 NOTE — Telephone Encounter (Signed)
Called pt to R/S appt for Heather. Left msg

## 2022-03-17 ENCOUNTER — Other Ambulatory Visit: Payer: Self-pay

## 2022-03-18 ENCOUNTER — Ambulatory Visit: Payer: Self-pay | Admitting: Licensed Clinical Social Worker

## 2022-03-18 ENCOUNTER — Other Ambulatory Visit: Payer: Self-pay

## 2022-03-18 DIAGNOSIS — F331 Major depressive disorder, recurrent, moderate: Secondary | ICD-10-CM

## 2022-03-18 DIAGNOSIS — G4700 Insomnia, unspecified: Secondary | ICD-10-CM

## 2022-03-18 DIAGNOSIS — F411 Generalized anxiety disorder: Secondary | ICD-10-CM

## 2022-03-18 NOTE — BH Specialist Note (Signed)
Integrated Behavioral Health via Telemedicine Visit  03/18/2022 Shannah Conteh [id number]  Number of [place] Clinician visits: No data recorded Session Start time: No data recorded  Session End time: No data recorded Total time in minutes: No data recorded  Referring Provider: [name], NP Patient/Family location: The Patient's Home  [facility] Provider location: [facility] [place] All persons participating in visit: Rozann Holts and [name], LCSWA  Types of Service: Telephone visit  I connected with [name]  via  Telephone or Video Enabled Telemedicine Application  (Video is Caregility application) and verified that I am speaking with the correct person using two identifiers. Discussed confidentiality: Yes   I discussed the limitations of telemedicine and the availability of in person appointments.  Discussed there is a possibility of technology failure and discussed alternative modes of communication if that failure occurs.  Patient and/or legal guardian expressed understanding and consented to Telemedicine visit: Yes   Presenting Concerns: Patient and/or family reports the following symptoms/concerns: The patient reports that she has been doing well since her last follow-up appointment. 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 She explained that by the time she goes home she is exhausted and goes to bed but is unable to fall asleep for several hours. The patient reports that she continues to take 7.5 MG of Mirtazapine before bedtime and Melatonin but still struggles to sleep. The patient stated that overall she feels she is coping better with stressors in her life by staying busy. She shared that she is looking forward to moving into her own place soon. Chelli denied any suicidal or homicidal thoughts.  Duration of problem: Years; Severity of problem:  moderate  Patient and/or Family's Strengths/Protective Factors: Social connections, Concrete supports in place (healthy food, safe environments, etc.), Sense of purpose, and Physical Health (exercise, healthy diet, medication compliance, etc.)  Goals Addressed: Patient will:  Reduce symptoms of: agitation, anxiety, depression, insomnia, and stress   Increase knowledge and/or ability of: coping skills, healthy habits, self-management skills, and stress reduction   Demonstrate ability to: Increase healthy adjustment to current life circumstances  Progress towards Goals: Ongoing  Interventions: Interventions utilized:  CBT Cognitive Behavioral Therapy was utilized by the clinician during today's follow up session. Clinician met with patient to identify needs related to stressors and functioning, and assess and monitor for signs and symptoms of anxiety and depression, and assess safety. The clinician processed with the patient how they have been doing since the last follow-up session.Clinician assessed the patients pattern of sleep, bedtime routines, activities associated with the bed, activity and energy level while awake, night time snacking, stimulant use, daytime napping, total sleep amounts. Clinician explored the patients thoughts and associated emotions regarding sleep. Clinician continued to encourage the patient to practice good sleep hygiene.The session ended with scheduling.  Standardized Assessments completed: GAD-7 and PHQ 9 GAD-7= 12 PHQ-9= 12  Assessment: Patient currently experiencing see above.   Patient may benefit from see above.  Plan: Follow up with behavioral health clinician on : 04/08/2021 at 9:00 AM Behavioral recommendations:  Referral(s): [place] (In Clinic)  I discussed the assessment and treatment plan with the patient and/or parent/guardian. They were provided an opportunity to ask questions and all were answered. They agreed with  the plan and demonstrated an understanding of the instructions.   They were advised to call back or seek an in-person  evaluation if the symptoms worsen or if the condition fails to improve as anticipated.  Lesli Albee, LCSWA

## 2022-04-08 ENCOUNTER — Ambulatory Visit: Payer: Self-pay | Admitting: Licensed Clinical Social Worker

## 2022-04-08 ENCOUNTER — Other Ambulatory Visit: Payer: Self-pay

## 2022-04-08 DIAGNOSIS — F331 Major depressive disorder, recurrent, moderate: Secondary | ICD-10-CM

## 2022-04-08 DIAGNOSIS — F411 Generalized anxiety disorder: Secondary | ICD-10-CM

## 2022-04-08 NOTE — BH Specialist Note (Signed)
Integrated Behavioral Health via Telemedicine Visit  04/08/2022 Melissa Dean [id number]   Referring Provider: [name], NP Patient/Family location: The patients home [facility] Provider location: [facility] [place] All persons participating in visit: Melissa Dean and Melissa Dean, LCSWA  Types of Service: Telephone visit  I connected with Melissa Dean via  Telephone or Video Enabled Telemedicine Application  (Video is Caregility application) and verified that I am speaking with the correct person using two identifiers. Discussed confidentiality: Yes   I discussed the limitations of telemedicine and the availability of in person appointments.  Discussed there is a possibility of technology failure and discussed alternative modes of communication if that failure occurs.  I discussed that engaging in this telemedicine visit, they consent to the provision of behavioral healthcare and the services will be billed under their insurance.  Patient and/or legal guardian expressed understanding and consented to Telemedicine visit: Yes   Presenting Concerns: Patient and/or family reports the following symptoms/concerns: The patient reports that she has been doing okay since her last follow up visit. She reported that she continues to take Mirtazapine 7.5 MG at bedtime. The patient stated that she is experiencing difficulty falling asleep usually taking in excess of two hours. Melissa Dean explained that she wakes up 5-6 times per night to use the bathroom. She stated that she sleeps a total of 5-6 hours per night. The patient explained that she feels tired during the day but pushes through because she has to get everything done. Melissa Dean shared that she cries several times during the day, sometimes because of a song or a memory but at other times for no reason at all. She stated that she feels down more than half the days in a week and finds focusing on positive thoughts and staying  busy helps. The patient endorsed feeling anxious and on edge, along with struggling with excessive, uncontrollable worrying and thoughts of worst-case scenarios daily. She denied any recent panic attacks. Melissa Dean noted that she had one day off for the holidays and was able to see each of her children. The patient shared that she continues to enjoy her new job  Duration of problem: Years; Severity of problem: moderate  Patient and/or Family's Strengths/Protective Factors: Concrete supports in place (healthy food, safe environments, etc.), Sense of purpose, and Physical Health (exercise, healthy diet, medication compliance, etc.)  Goals Addressed: Patient will:  Reduce symptoms of: agitation, anxiety, depression, insomnia, and stress   Increase knowledge and/or ability of: coping skills, healthy habits, self-management skills, and stress reduction   Demonstrate ability to: Increase healthy adjustment to current life circumstances and Increase adequate support systems for patient/family  Progress towards Goals: Ongoing  Interventions: Interventions utilized:  CBT Cognitive Behavioral Therapy was utilized by the clinician during today's follow up session. Clinician met with patient to identify needs related to stressors and functioning, and assess and monitor for signs and symptoms of anxiety and depression, and assess safety. The clinician processed with the patient how they have been doing since the last follow-up session.Clinician measured the patients anxiety and depression on a numerical scale. Clinician assessed the patients pattern of sleep, bedtime routines, activities associated with the bed, activity and energy level while awake, night time snacking, stimulant use, daytime napping, total sleep amounts. Clinician explored the patients thoughts and associated emotions regarding sleep.Clinician offered to take the patients concerns regarding sleep and her psychotropic medication for consultation  with Melissa Hy, LCSW, Dr. Octavia Dean, M.D. Psychiatric Consultant, and [name], NP on  Tuesday 04/14/2022 at 11:00 AM.   Standardized Assessments completed: GAD-7 and PHQ 9 PHQ-9= 14 GAD-7= 19  Patient and/or Family Response: Melissa Dean  no emotions funeral just sitting there Valium Trazodone Edma   Assessment: Patient currently experiencing See Above.   Patient may benefit from See Above.  Plan: Follow up with behavioral health clinician on : 04/15/2022 at 9:00 AM  Behavioral recommendations:  Referral(s): [place] (In Clinic)  I discussed the assessment and treatment plan with the patient and/or parent/guardian. They were provided an opportunity to ask questions and all were answered. They agreed with the plan and demonstrated an understanding of the instructions.   They were advised to call back or seek an in-person evaluation if the symptoms worsen or if the condition fails to improve as anticipated.  Lesli Albee, LCSWA

## 2022-04-15 ENCOUNTER — Ambulatory Visit: Payer: Self-pay | Admitting: Licensed Clinical Social Worker

## 2022-04-15 DIAGNOSIS — F331 Major depressive disorder, recurrent, moderate: Secondary | ICD-10-CM

## 2022-04-15 DIAGNOSIS — F411 Generalized anxiety disorder: Secondary | ICD-10-CM

## 2022-04-15 DIAGNOSIS — G4700 Insomnia, unspecified: Secondary | ICD-10-CM

## 2022-04-15 NOTE — BH Specialist Note (Signed)
Integrated Behavioral Health via Telemedicine Visit  04/15/2022 Melissa Dean [id number]  Number of Melissa Dean Clinician visits: No data recorded Session Start time: No data recorded  Session End time: No data recorded Total time in minutes: No data recorded  Referring Provider: Carlyon Dean. NP Patient/Family location: Melissa Patient's address Melissa Dean Provider location: Melissa Dean All persons participating in visit: Melissa Dean and Melissa Dean and Melissa Dean, LCSWA Types of Service: Telephone visit  I connected with Melissa Dean via  Telephone or Video Enabled Telemedicine Application  (Video is Caregility application) and verified that I am speaking with Melissa correct person using two identifiers. Discussed confidentiality: Yes   I discussed Melissa limitations of telemedicine and Melissa availability of in person appointments.  Discussed there is a possibility of technology failure and discussed alternative modes of communication if that failure occurs.  Patient and/or legal guardian expressed understanding and consented to Telemedicine visit: Yes   Presenting Concerns: Patient and/or family reports Melissa following symptoms/concerns: Patient reports that she has been doing well since her last follow-up visit. Melissa Dean confirmed that she has not experienced any changes in sleep since her last session on 04/08/2022 and continues to take Mirtazapine 7.5 MG at bedtime. Melissa patient stated that she is experiencing difficulty falling asleep usually taking in excess of one to two hours. She stated that she sleeps a total of 5-6 hours per night and feels like she needs to nap during Melissa day but due to her busy work schedule is unable to do so. Melissa patient explained that she had previously tried to sleep with Melissa television on and off and had not noticed any improvement either way.She stated that she is drinks decaffeinated coffee in Melissa morning and an energy drink around  lunch time . She stated she does drink root beer but believes it is decaffeinated. Melissa patient shared that she applied to a couple of apartments and is hopeful she will have a place of her own soon. Melissa Dean denied any suicidal or homicidal thoughts.  Duration of problem: Years; Severity of problem: moderate  Patient and/or Family's Strengths/Protective Factors: Concrete supports in place (healthy food, safe environments, etc.)  Goals Addressed: Patient will:  Reduce symptoms of: agitation, anxiety, depression, insomnia, and stress   Increase knowledge and/or ability of: coping skills, healthy habits, self-management skills, and stress reduction   Demonstrate ability to: Increase healthy adjustment to current life circumstances  Progress towards Goals: Ongoing  Interventions: Interventions utilized:  CBT Cognitive Behavioral Therapy was utilized by Melissa clinician during today's follow up session. Clinician met with patient to identify needs related to stressors and functioning, and assess and monitor for signs and symptoms of anxiety and depression, and assess safety. Melissa clinician processed with Melissa patient how they have been doing since Melissa last follow-up session.  Clinician continued to provide Psychoeducation to Melissa patient regarding sleep hygiene ( e.g.,  avoid napping, use bed only for sleep or sexual activities, limit caffeine). Clinician explained that Melissa case consultation meeting on 04/14/2022 was postponed due to severe weather and will take Melissa patients concerns regarding sleep and her psychotropic medication for consultation with Melissa Hy, LCSW, Melissa Dean, M.D. Psychiatric Consultant, and Melissa Shadow, NP on Tuesday 04/16/2022 at 10:00 AM.    Standardized Assessments were completed on April 08, 2022: GAD-7 and PHQ 9 GAD-7=19 PHQ-9=14  Patient and/or Family Response: Melissa patient was agreeable to Melissa psychiatric case consultation.    Assessment: Patient currently  experiencing see above.  Patient may benefit from see above.  Plan: Follow up with behavioral health clinician on : 04/22/2021 at 9:00 am.  Behavioral recommendations:  Referral(s): Melissa Dean (In Clinic)  I discussed Melissa assessment and treatment plan with Melissa patient and/or parent/guardian. They were provided an opportunity to ask questions and all were answered. They agreed with Melissa plan and demonstrated an understanding of Melissa instructions.   They were advised to call back or seek an in-person evaluation if Melissa symptoms worsen or if Melissa condition fails to improve as anticipated.  Melissa Dean, LCSWA

## 2022-04-22 ENCOUNTER — Ambulatory Visit: Payer: Self-pay | Admitting: Licensed Clinical Social Worker

## 2022-04-23 ENCOUNTER — Other Ambulatory Visit: Payer: Self-pay

## 2022-04-23 ENCOUNTER — Other Ambulatory Visit: Payer: Self-pay | Admitting: Gerontology

## 2022-04-23 ENCOUNTER — Ambulatory Visit: Payer: Self-pay | Admitting: Licensed Clinical Social Worker

## 2022-04-23 DIAGNOSIS — F411 Generalized anxiety disorder: Secondary | ICD-10-CM

## 2022-04-23 DIAGNOSIS — F331 Major depressive disorder, recurrent, moderate: Secondary | ICD-10-CM

## 2022-04-23 DIAGNOSIS — G4700 Insomnia, unspecified: Secondary | ICD-10-CM

## 2022-04-23 DIAGNOSIS — J449 Chronic obstructive pulmonary disease, unspecified: Secondary | ICD-10-CM

## 2022-04-23 MED ORDER — MIRTAZAPINE 15 MG PO TABS
7.5000 mg | ORAL_TABLET | Freq: Every evening | ORAL | 0 refills | Status: DC
  Filled 2022-04-23: qty 15, 30d supply, fill #0

## 2022-04-23 NOTE — BH Specialist Note (Signed)
Integrated Behavioral Health via Telemedicine Visit  04/23/2022 Melissa Dean [id number]  Number of [place] Clinician visits: No data recorded Session Start time: No data recorded  Session End time: No data recorded Total time in minutes: No data recorded  Referring Provider: [name], NP Patient/Family location: The Patient's Home [facility] Provider location: [facility] [place] All persons participating in visit: Keilynn Marano and [name], LCSW-A Types of Service: Telephone visit  I connected with Melissa Dean  via  Telephone or Video Enabled Telemedicine Application  (Video is Caregility application) and verified that I am speaking with the correct person using two identifiers. Discussed confidentiality: Yes   I discussed the limitations of telemedicine and the availability of in person appointments.  Discussed there is a possibility of technology failure and discussed alternative modes of communication if that failure occurs.  Patient and/or legal guardian expressed understanding and consented to Telemedicine visit: Yes   Presenting Concerns: Patient and/or family reports the following symptoms/concerns: The patient reports that she has been doing about the same since her last follow-up session.  Melissa Dean noted that she continues to work long hours but enjoys staying busy.  She stated that she recently moved in to her own apartment. Larra shared that she has never lived by herself before.  The patient discussed financial and other situational stressors impacting her life currently.  She explained that she continues to struggle with falling asleep and often lays in bed for over an hour or more before she falls asleep. However, Melissa Dean noted that when she falls asleep she wakes up less frequently now.The patient reports that she has been out of mirtazapine for a few days. The patient stated that she is looking forward to having her  grandchildren come stay with her on her days off.  Melissa Dean denied any suicidal or homicidal thoughts. duration of problem: Years; Severity of problem: moderate  Patient and/or Family's Strengths/Protective Factors: Social connections, Concrete supports in place (healthy food, safe environments, etc.), Sense of purpose, and Physical Health (exercise, healthy diet, medication compliance, etc.)  Goals Addressed: Patient will:  Reduce symptoms of: agitation, anxiety, depression, insomnia, and stress   Increase knowledge and/or ability of: coping skills, healthy habits, self-management skills, and stress reduction   Demonstrate ability to: Increase healthy adjustment to current life circumstances  Progress towards Goals: Ongoing  Interventions: Interventions utilized:  CBT Cognitive Behavioral Therapy was utilized by the clinician during today's follow up session. Clinician met with patient to identify needs related to stressors and functioning, and assess and monitor for signs and symptoms of anxiety and depression, and assess safety. The clinician processed with the patient how they have been doing since the last follow-up session. Clinician measured the patient's anxiety and depression on a numerical scale. Clinician encouraged the patient to continue to work on calming techniques (eg. paced breathing, deep muscle relaxation, and calming imagery) as a strategy for responding appropriately to anxiety and the urge to avoid situations or self-isolate when they occur and move towards increasing the patients self regulation. Clinician offered to message the patient's PCP regarding her medication refill request; the patient accepted and the clinician messaged [name], NP on 04/23/2021.  The session ended with scheduling.  Standardized Assessments completed: GAD-7 and PHQ 9 GAD-7= 19 PHQ-9= 10  Assessment: Patient currently experiencing see above.   Patient may benefit from see  above.  Plan: Follow up with behavioral health clinician on : 04/28/2022 at 4:00 PM  Behavioral recommendations:  Referral(s): [place] (In Clinic)  I discussed the assessment and treatment plan with the patient and/or parent/guardian. They were provided an opportunity to ask questions and all were answered. They agreed with the plan and demonstrated an understanding of the instructions.   They were advised to call back or seek an in-person evaluation if the symptoms worsen or if the condition fails to improve as anticipated.  Melissa Dean, LCSWA

## 2022-04-28 ENCOUNTER — Ambulatory Visit: Payer: Self-pay | Admitting: Licensed Clinical Social Worker

## 2022-04-28 ENCOUNTER — Telehealth: Payer: Self-pay | Admitting: Licensed Clinical Social Worker

## 2022-04-28 NOTE — Telephone Encounter (Signed)
Called the patient to inform her that the Psychiatric case consultation recommendations were for her to increase Mirtazapine to 15 MG before bed. Clinician provided space for the patient to have her questions answered regarding the change. The patient stated she understood and had no further questions. Call ended with scheduling an IBH follow-up appointment.

## 2022-04-29 ENCOUNTER — Other Ambulatory Visit: Payer: Self-pay

## 2022-04-29 ENCOUNTER — Other Ambulatory Visit: Payer: Self-pay | Admitting: Gerontology

## 2022-04-29 DIAGNOSIS — G4700 Insomnia, unspecified: Secondary | ICD-10-CM

## 2022-04-29 MED ORDER — MIRTAZAPINE 15 MG PO TABS
15.0000 mg | ORAL_TABLET | Freq: Every day | ORAL | 0 refills | Status: DC
  Filled 2022-04-29 (×2): qty 30, 30d supply, fill #0

## 2022-04-30 ENCOUNTER — Other Ambulatory Visit: Payer: Self-pay

## 2022-05-05 ENCOUNTER — Ambulatory Visit: Payer: Self-pay | Admitting: Licensed Clinical Social Worker

## 2022-05-07 ENCOUNTER — Other Ambulatory Visit: Payer: Self-pay

## 2022-05-28 ENCOUNTER — Other Ambulatory Visit: Payer: Self-pay

## 2022-05-28 ENCOUNTER — Other Ambulatory Visit: Payer: Self-pay | Admitting: Gerontology

## 2022-05-28 DIAGNOSIS — G4700 Insomnia, unspecified: Secondary | ICD-10-CM

## 2022-05-28 MED ORDER — MIRTAZAPINE 15 MG PO TABS
15.0000 mg | ORAL_TABLET | Freq: Every day | ORAL | 0 refills | Status: DC
  Filled 2022-05-28: qty 7, 7d supply, fill #0

## 2022-06-02 ENCOUNTER — Institutional Professional Consult (permissible substitution): Payer: Self-pay | Admitting: Licensed Clinical Social Worker

## 2022-06-04 ENCOUNTER — Other Ambulatory Visit: Payer: Self-pay

## 2022-06-04 ENCOUNTER — Other Ambulatory Visit: Payer: Self-pay | Admitting: Gerontology

## 2022-06-04 DIAGNOSIS — G4700 Insomnia, unspecified: Secondary | ICD-10-CM

## 2022-06-04 MED ORDER — MIRTAZAPINE 15 MG PO TABS
15.0000 mg | ORAL_TABLET | Freq: Every day | ORAL | 0 refills | Status: DC
  Filled 2022-06-04: qty 7, 7d supply, fill #0

## 2022-06-08 ENCOUNTER — Ambulatory Visit: Payer: Self-pay | Admitting: Licensed Clinical Social Worker

## 2022-06-09 ENCOUNTER — Other Ambulatory Visit: Payer: Self-pay | Admitting: Gerontology

## 2022-06-09 ENCOUNTER — Other Ambulatory Visit: Payer: Self-pay

## 2022-06-09 DIAGNOSIS — Z8719 Personal history of other diseases of the digestive system: Secondary | ICD-10-CM

## 2022-06-09 DIAGNOSIS — G4700 Insomnia, unspecified: Secondary | ICD-10-CM

## 2022-06-09 MED ORDER — MIRTAZAPINE 15 MG PO TABS
15.0000 mg | ORAL_TABLET | Freq: Every day | ORAL | 0 refills | Status: DC
  Filled 2022-06-09 – 2022-06-10 (×2): qty 7, 7d supply, fill #0

## 2022-06-09 MED ORDER — PANTOPRAZOLE SODIUM 40 MG PO TBEC
80.0000 mg | DELAYED_RELEASE_TABLET | Freq: Every day | ORAL | 2 refills | Status: DC
  Filled 2022-06-09: qty 60, 30d supply, fill #0
  Filled 2022-07-12 (×2): qty 60, 30d supply, fill #1
  Filled 2022-08-13: qty 60, 30d supply, fill #2

## 2022-06-10 ENCOUNTER — Other Ambulatory Visit: Payer: Self-pay

## 2022-06-10 ENCOUNTER — Other Ambulatory Visit: Payer: Self-pay | Admitting: Gerontology

## 2022-06-15 ENCOUNTER — Ambulatory Visit: Payer: Self-pay | Admitting: Licensed Clinical Social Worker

## 2022-06-15 ENCOUNTER — Telehealth: Payer: Self-pay | Admitting: Licensed Clinical Social Worker

## 2022-06-15 NOTE — Telephone Encounter (Signed)
Called the patient during today's scheduled appointment; no answer, left a voicemail with the clinic contact information; rescheduled patient to 06/22/2022 at 10:00 AM.

## 2022-06-20 ENCOUNTER — Other Ambulatory Visit: Payer: Self-pay | Admitting: Gerontology

## 2022-06-20 DIAGNOSIS — G4700 Insomnia, unspecified: Secondary | ICD-10-CM

## 2022-06-22 ENCOUNTER — Other Ambulatory Visit: Payer: Self-pay

## 2022-06-22 ENCOUNTER — Other Ambulatory Visit: Payer: Self-pay | Admitting: Gerontology

## 2022-06-22 ENCOUNTER — Ambulatory Visit: Payer: Self-pay | Admitting: Licensed Clinical Social Worker

## 2022-06-22 DIAGNOSIS — F339 Major depressive disorder, recurrent, unspecified: Secondary | ICD-10-CM

## 2022-06-22 DIAGNOSIS — G4700 Insomnia, unspecified: Secondary | ICD-10-CM

## 2022-06-22 DIAGNOSIS — F411 Generalized anxiety disorder: Secondary | ICD-10-CM

## 2022-06-22 NOTE — BH Specialist Note (Signed)
Integrated Behavioral Health via Telemedicine Visit  06/22/2022 Melissa Dean [id number]  Number of [place] Clinician visits: No data recorded Session Start time: No data recorded  Session End time: No data recorded Total time in minutes: No data recorded  Referring Provider: [name], NP Patient/Family location: The patient's home  [facility] Provider location: Remote; [place], [place] All persons participating in visit: Melissa Dean and Melissa Cairo, LCSW-A Types of Service: Telephone visit  I connected with Melissa Dean via Telephone or Video Enabled Telemedicine Application  (Video is Caregility application) and verified that I am speaking with the correct person using two identifiers. Discussed confidentiality: Yes   I discussed the limitations of telemedicine and the availability of in person appointments.  Discussed there is a possibility of technology failure and discussed alternative modes of communication if that failure occurs.  Patient and/or legal guardian expressed understanding and consented to Telemedicine visit: Yes   Presenting Concerns: Patient and/or family reports the following symptoms/concerns: The patient reports that she has been doing well since her last follow-up appointment. She explained that she has not been able to pick up her prescription refill for Mirtazapine, and is running low. The patient noted that she has noticed an improvement in her overall mood since her last appointment. She feels the Mirtazapine is working as it was intended. Monika stated that she is sleeping better and is more energetic during her day. She denied any panic attacks, or suicidal or homicidal thoughts.  Duration of problem: Years; Severity of problem: moderate  Patient and/or Family's Strengths/Protective Factors: Concrete supports in place (healthy food, safe environments, etc.), Sense of purpose, and Physical Health (exercise, healthy diet,  medication compliance, etc.)  Goals Addressed: Patient will:  Reduce symptoms of: agitation, anxiety, depression, and insomnia   Increase knowledge and/or ability of: coping skills, healthy habits, self-management skills, and stress reduction   Demonstrate ability to: Increase healthy adjustment to current life circumstances  Progress towards Goals: Ongoing  Interventions: Interventions utilized:  CBT Cognitive Behavioral Therapywas utilized by the clinician during today's follow up session. Clinician met with patient to identify needs related to stressors and functioning, and assess and monitor for signs and symptoms of anxiety and depression, and assess safety. The clinician processed with the patient how they have been doing since the last follow-up session. Clinician measured the patient's anxiety and depression on a numerical scale. Clinician informed the patient that she had put in her notice and her last day would be 07/14/2022. Clinician reassured the patient that her well being remained a priority and explained the process for transitioning care to another mental health provider. Clinician scheduled the patient a follow-up to continue to work through the transition process. Clinician provided space for the patient to have her questions and concerns addressed. Clinician messaged [name], NP regarding the patient's medication concerns. Standardized Assessments completed: GAD-7 and PHQ 9 GAD-7=17 PHQ-9=06  Patient and/or Family Response: Patient expressed understanding and agreed with the plan.  Assessment: Patient currently experiencing see above.   Patient may benefit from see above.  Plan: Follow up with behavioral health clinician on : 06/29/2022 at 10:00 AM  Behavioral recommendations:  Referral(s): [place] (In Clinic)  I discussed the assessment and treatment plan with the patient and/or parent/guardian. They were provided an opportunity  to ask questions and all were answered. They agreed with the plan and demonstrated an understanding of the instructions.   They were advised to call back or seek an in-person evaluation  if the symptoms worsen or if the condition fails to improve as anticipated.  Lesli Albee, LCSWA

## 2022-06-23 ENCOUNTER — Other Ambulatory Visit: Payer: Self-pay

## 2022-06-25 ENCOUNTER — Other Ambulatory Visit: Payer: Self-pay

## 2022-06-25 ENCOUNTER — Other Ambulatory Visit: Payer: Self-pay | Admitting: Emergency Medicine

## 2022-06-25 DIAGNOSIS — G4700 Insomnia, unspecified: Secondary | ICD-10-CM

## 2022-06-25 MED ORDER — MIRTAZAPINE 15 MG PO TABS
15.0000 mg | ORAL_TABLET | Freq: Every day | ORAL | 0 refills | Status: DC
  Filled 2022-06-25: qty 7, 7d supply, fill #0

## 2022-06-29 ENCOUNTER — Ambulatory Visit: Payer: Self-pay | Admitting: Licensed Clinical Social Worker

## 2022-06-29 DIAGNOSIS — F411 Generalized anxiety disorder: Secondary | ICD-10-CM

## 2022-06-29 DIAGNOSIS — F339 Major depressive disorder, recurrent, unspecified: Secondary | ICD-10-CM

## 2022-06-29 NOTE — BH Specialist Note (Signed)
Integrated Behavioral Health via Telemedicine Visit  06/29/2022 Shayonna Whitinger [id number]  Number of [place] Clinician visits: No data recorded Session Start time: No data recorded  Session End time: No data recorded Total time in minutes: No data recorded  Referring Provider: [name], NP  Patient/Family location: The Patient's Work [facility] Provider location: Remote; [place]  All persons participating in visit: [name] and [name], LCSW-A Types of Service: Telephone visit  I connected with [name] via  Telephone or Video Enabled Telemedicine Application  (Video is Caregility application) and verified that I am speaking with the correct person using two identifiers. Discussed confidentiality: Yes   I discussed the limitations of telemedicine and the availability of in person appointments.  Discussed there is a possibility of technology failure and discussed alternative modes of communication if that failure occurs.  Patient expressed understanding and consented to Telemedicine visit: Yes   Presenting Concerns: Patient and/or family reports the following symptoms/concerns: The patient reported that she has been doing well since her last follow-up appointment. Morganna noted that she was at work, but was in a private area and able to continue. The patient explained that she is going to pick up her refill for Mirtazapine today. However, she explained that her provider only wrote the prescription for a 7 day supply and she requested the clinician speak to her provider to see if she will refill her full prescription. Merilee denied any appetite, mood, changes, suicidal or homicidal thoughts. She noted improved overall sleep and shared that she is no longer experiencing any disturbing dreams. The patient denied any suicidal or homicidal thoughts.  Duration of problem: Years; Severity of problem: moderate  Patient and/or Family's  Strengths/Protective Factors: Concrete supports in place (healthy food, safe environments, etc.), Sense of purpose, and Physical Health (exercise, healthy diet, medication compliance, etc.)  Goals Addressed: Patient will:  Reduce symptoms of: agitation, anxiety, depression, insomnia, and stress   Increase knowledge and/or ability of: coping skills, healthy habits, self-management skills, and stress reduction   Demonstrate ability to: Increase healthy adjustment to current life circumstances  Progress towards Goals: Discontinued  Interventions/plan: Interventions utilized:  Supportive Reflection was utilized by clinician during today's session. Clinician processed with the patient her progress in therapy and her feelings regarding transitioning to another mental health provider. The clinician explained that the patient would receive a letter in the mail regarding the clinician leaving the [facility]. The letter would contain crisis resources and referrals to other mental health providers in the area and she would receive follow-up calls from the [facility] to check in on the referrals and to provide further support to the patient should she need. Clinician explained that the patient's primary care provider would bridge her psychotropic medications. Clinician provided space for the patient to have her questions or concerns addressed. Standardized Assessments completed: GAD-7 and PHQ 9 GAD-7=15 PHQ-9= 09  Patient and/or Family Response: Patient expressed understanding and agreed with the plan.  Assessment: Patient currently experiencing see above.   Patient may benefit from see above.  Plan: Follow up with behavioral health clinician on : none Behavioral recommendations:  Referral(s): [place] (LME/Outside Clinic) Referral to [facility]   I discussed the assessment and treatment plan with the patient and/or parent/guardian. They were provided an opportunity to  ask questions and all were answered. They agreed with the plan and demonstrated an understanding of the instructions.   They were advised to call back or seek  an in-person evaluation if the symptoms worsen or if the condition fails to improve as anticipated.  Lesli Albee, LCSWA

## 2022-06-30 ENCOUNTER — Other Ambulatory Visit: Payer: Self-pay | Admitting: Gerontology

## 2022-06-30 ENCOUNTER — Other Ambulatory Visit: Payer: Self-pay

## 2022-06-30 ENCOUNTER — Other Ambulatory Visit: Payer: Self-pay | Admitting: Emergency Medicine

## 2022-06-30 DIAGNOSIS — G4700 Insomnia, unspecified: Secondary | ICD-10-CM

## 2022-06-30 MED ORDER — MIRTAZAPINE 15 MG PO TABS
15.0000 mg | ORAL_TABLET | Freq: Every day | ORAL | 0 refills | Status: DC
  Filled 2022-06-30: qty 30, 30d supply, fill #0

## 2022-07-12 ENCOUNTER — Other Ambulatory Visit: Payer: Self-pay

## 2022-07-13 ENCOUNTER — Other Ambulatory Visit: Payer: Self-pay

## 2022-08-03 ENCOUNTER — Other Ambulatory Visit: Payer: Self-pay | Admitting: Gerontology

## 2022-08-03 ENCOUNTER — Other Ambulatory Visit: Payer: Self-pay

## 2022-08-03 DIAGNOSIS — G4700 Insomnia, unspecified: Secondary | ICD-10-CM

## 2022-08-04 ENCOUNTER — Other Ambulatory Visit: Payer: Self-pay

## 2022-08-04 MED FILL — Mirtazapine Tab 15 MG: ORAL | 30 days supply | Qty: 30 | Fill #0 | Status: AC

## 2022-08-04 NOTE — Telephone Encounter (Signed)
OK per Elizabeth, NP 

## 2022-08-07 ENCOUNTER — Other Ambulatory Visit: Payer: Self-pay

## 2022-08-13 ENCOUNTER — Other Ambulatory Visit: Payer: Self-pay

## 2022-08-14 ENCOUNTER — Other Ambulatory Visit: Payer: Self-pay

## 2022-08-25 ENCOUNTER — Other Ambulatory Visit: Payer: Self-pay

## 2022-08-25 DIAGNOSIS — Z1231 Encounter for screening mammogram for malignant neoplasm of breast: Secondary | ICD-10-CM

## 2022-08-27 ENCOUNTER — Ambulatory Visit: Payer: Self-pay | Admitting: Gerontology

## 2022-08-27 ENCOUNTER — Other Ambulatory Visit: Payer: Self-pay

## 2022-08-27 ENCOUNTER — Encounter: Payer: Self-pay | Admitting: Gerontology

## 2022-08-27 VITALS — BP 136/83 | HR 88 | Temp 98.4°F | Resp 16 | Ht 62.0 in | Wt 229.1 lb

## 2022-08-27 DIAGNOSIS — M2560 Stiffness of unspecified joint, not elsewhere classified: Secondary | ICD-10-CM | POA: Insufficient documentation

## 2022-08-27 DIAGNOSIS — Z8719 Personal history of other diseases of the digestive system: Secondary | ICD-10-CM

## 2022-08-27 DIAGNOSIS — Z0000 Encounter for general adult medical examination without abnormal findings: Secondary | ICD-10-CM

## 2022-08-27 DIAGNOSIS — J449 Chronic obstructive pulmonary disease, unspecified: Secondary | ICD-10-CM

## 2022-08-27 DIAGNOSIS — G4700 Insomnia, unspecified: Secondary | ICD-10-CM

## 2022-08-27 MED ORDER — MIRTAZAPINE 15 MG PO TABS
15.0000 mg | ORAL_TABLET | Freq: Every day | ORAL | 0 refills | Status: DC
Start: 2022-08-27 — End: 2022-09-02
  Filled 2022-08-27: qty 30, 30d supply, fill #0

## 2022-08-27 MED ORDER — ALBUTEROL SULFATE HFA 108 (90 BASE) MCG/ACT IN AERS
INHALATION_SPRAY | RESPIRATORY_TRACT | 1 refills | Status: DC
Start: 2022-08-27 — End: 2022-09-23
  Filled 2022-08-27: qty 18, 8d supply, fill #0
  Filled 2022-08-28: qty 6.7, 25d supply, fill #0

## 2022-08-27 MED ORDER — PANTOPRAZOLE SODIUM 40 MG PO TBEC
80.0000 mg | DELAYED_RELEASE_TABLET | Freq: Every day | ORAL | 2 refills | Status: AC
Start: 2022-08-27 — End: ?
  Filled 2022-08-27 – 2022-09-08 (×2): qty 60, 30d supply, fill #0
  Filled 2022-09-23: qty 60, 30d supply, fill #1

## 2022-08-27 NOTE — Progress Notes (Signed)
Established Patient Office Visit  Subjective   Patient ID: Melissa Dean, female    DOB: November 24, 1970  Age: 52 y.o. MRN: [id number]  Chief Complaint  Patient presents with   Follow-up    HPI  Melissa Dean is a 52 year old female who has history of anemia, anxiety, asthma, bronchitis, COPD, depression presents for routine follow up and medication refill. She states that she's compliant with her medications, denies side effects and continues to make healthy lifestyle changes . She states that her mood is good since increasing her  Mirtazapine dose, she denies suicidal nor homicidal ideation. PHQ-9 = 0, GAD - 7 = 0 She c/o being generalized stiffness at the end of her work day, states the base of her neck is swollen, and her abdomen are swollen. She works in Engineering ge[employer]. She follows up at [facility] on 05/14/22 by Dr Netta Neat, she continues on oral provera and estradiol patch. Overall, she states that she's doing well and offers no further concerns.   Review of Systems  Constitutional: Negative.   Respiratory: Negative.    Cardiovascular: Negative.   Gastrointestinal: Negative.   Neurological: Negative.   Psychiatric/Behavioral: Negative.        Objective:     BP 136/83 (BP Location: Right Arm, Patient Position: Sitting, Cuff Size: Large)   Pulse 88   Temp 98.4 F (36.9 C)   Resp 16   Ht 5\' 2"  (1.575 m)   Wt 229 lb 1.6 oz (103.9 kg)   SpO2 99%   BMI 41.90 kg/m  BP Readings from Last 3 Encounters:  08/27/22 136/83  03/03/22 124/83  10/28/21 134/79   Wt Readings from Last 3 Encounters:  08/27/22 229 lb 1.6 oz (103.9 kg)  03/03/22 198 lb (89.8 kg)  10/28/21 186 lb 14.4 oz (84.8 kg)      Physical Exam HENT:     Head: Normocephalic and atraumatic.     Mouth/Throat:     Mouth: Mucous membranes are moist.  Eyes:     Pupils: Pupils are equal, round, and reactive to light.  Cardiovascular:     Rate and Rhythm: Normal rate and regular rhythm.     Pulses: Normal pulses.      Heart sounds: Normal heart sounds.  Pulmonary:     Effort: Pulmonary effort is normal.     Breath sounds: Normal breath sounds.  Musculoskeletal:        General: Normal range of motion.  Skin:    General: Skin is warm.  Neurological:     General: No focal deficit present.     Mental Status: She is alert and oriented to person, place, and time. Mental status is at baseline.  Psychiatric:        Mood and Affect: Mood normal.        Behavior: Behavior normal.        Thought Content: Thought content normal.        Judgment: Judgment normal.      No results found for any visits on 08/27/22.  Last CBC Lab Results  Component Value Date   WBC 7.0 08/26/2021   HGB 11.2 (L) 08/26/2021   HCT 34.8 (L) 08/26/2021   MCV 82.7 08/26/2021   MCH 26.6 08/26/2021   RDW 14.8 08/26/2021   PLT 266 08/26/2021   Last metabolic panel Lab Results  Component Value Date   GLUCOSE 91 08/26/2021   NA 138 08/26/2021   K 3.4 (L) 08/26/2021   CL 107 08/26/2021  CO2 24 08/26/2021   BUN 7 08/26/2021   CREATININE 0.90 08/26/2021   GFRNONAA >60 08/26/2021   CALCIUM 8.9 08/26/2021   PROT 7.4 10/28/2020   ALBUMIN 4.0 10/28/2020   LABGLOB 2.5 09/12/2020   AGRATIO 1.6 09/12/2020   BILITOT 0.6 10/28/2020   ALKPHOS 73 10/28/2020   AST 27 10/28/2020   ALT 20 10/28/2020   ANIONGAP 7 08/26/2021   Last lipids Lab Results  Component Value Date   CHOL 163 09/12/2020   HDL 45 09/12/2020   LDLCALC 69 09/12/2020   TRIG 309 (H) 09/12/2020   CHOLHDL 3.0 02/08/2020   Last hemoglobin A1c Lab Results  Component Value Date   HGBA1C 5.3 12/19/2020   Last thyroid functions Lab Results  Component Value Date   TSH 6.090 (H) 12/11/2020   T4TOTAL 6.7 12/19/2020      The 10-year ASCVD risk score (Arnett DK, et al., 2019) is: 1.2%    Assessment & Plan:   1. Insomnia, unspecified type - She states that her mood has improved, and will continue current medication. - mirtazapine (REMERON) 15 MG  tablet; Take 1 tablet (15 mg total) by mouth at bedtime.  Dispense: 30 tablet; Refill: 0  2. History of gastroesophageal reflux (GERD) - Her acid reflux is improving, she will continue on current medication -Avoid spicy, fatty and fried food -Avoid sodas and sour juices -Avoid heavy meals -Avoid eating 4 hours before bedtime -Elevate head of bed at night - pantoprazole (PROTONIX) 40 MG tablet; Take 2 tablets (80 mg total) by mouth daily.  Dispense: 60 tablet; Refill: 2  3. Health care maintenance - Will recheck routine labs. - CBC w/Diff; Future - Comp Met (CMET); Future - Lipid panel; Future - HgB A1c; Future - HgB A1c - Lipid panel - Comp Met (CMET) - CBC w/Diff  4. Chronic obstructive pulmonary disease, unspecified COPD type (HCC) - She will continue current medication. - albuterol (VENTOLIN HFA) 108 (90 Base) MCG/ACT inhaler; INHALE 2 TO 4 PUFFS BY MOUTH EVERY 4 HOURS AS NEEDED FOR SHORTNESS OF BREATH FOR WHEEZING FOR COUGH  Dispense: 18 g; Refill: 1  5. Generalized stiffness - Unknown etiology for generalized weakness and will check labs . Likely refer to Rheumatologist Dr Gavin Potters. - Sedimentation rate; Future - C-reactive protein; Future - Vitamin D (25 hydroxy); Future - Vitamin D (25 hydroxy) - C-reactive protein - Sedimentation rate    Return in about 13 weeks (around 11/26/2022), or if symptoms worsen or fail to improve.    Shaughnessy Gethers Trellis Paganini, NP

## 2022-08-28 ENCOUNTER — Other Ambulatory Visit: Payer: Self-pay

## 2022-08-28 LAB — COMPREHENSIVE METABOLIC PANEL
ALT: 14 IU/L (ref 0–32)
AST: 14 IU/L (ref 0–40)
Albumin/Globulin Ratio: 1.6 (ref 1.2–2.2)
Albumin: 4.2 g/dL (ref 3.9–4.9)
Alkaline Phosphatase: 104 IU/L (ref 44–121)
BUN/Creatinine Ratio: 13 (ref 9–23)
BUN: 10 mg/dL (ref 6–24)
Bilirubin Total: 0.2 mg/dL (ref 0.0–1.2)
CO2: 21 mmol/L (ref 20–29)
Calcium: 9.4 mg/dL (ref 8.7–10.2)
Chloride: 105 mmol/L (ref 96–106)
Creatinine, Ser: 0.75 mg/dL (ref 0.57–1.00)
Globulin, Total: 2.6 g/dL (ref 1.5–4.5)
Glucose: 139 mg/dL — ABNORMAL HIGH (ref 70–99)
Potassium: 4.1 mmol/L (ref 3.5–5.2)
Sodium: 139 mmol/L (ref 134–144)
Total Protein: 6.8 g/dL (ref 6.0–8.5)
eGFR: 98 mL/min/{1.73_m2} (ref >59)

## 2022-08-28 LAB — CBC WITH DIFFERENTIAL/PLATELET
Basophils Absolute: 0.1 10*3/uL (ref 0.0–0.2)
Basos: 1 %
EOS (ABSOLUTE): 0.2 10*3/uL (ref 0.0–0.4)
Eos: 3 %
Hematocrit: 34.1 % (ref 34.0–46.6)
Hemoglobin: 10.7 g/dL — ABNORMAL LOW (ref 11.1–15.9)
Immature Grans (Abs): 0 10*3/uL (ref 0.0–0.1)
Immature Granulocytes: 0 %
Lymphocytes Absolute: 2.1 10*3/uL (ref 0.7–3.1)
Lymphs: 30 %
MCH: 24.2 pg — ABNORMAL LOW (ref 26.6–33.0)
MCHC: 31.4 g/dL — ABNORMAL LOW (ref 31.5–35.7)
MCV: 77 fL — ABNORMAL LOW (ref 79–97)
Monocytes Absolute: 0.5 10*3/uL (ref 0.1–0.9)
Monocytes: 7 %
Neutrophils Absolute: 4.3 10*3/uL (ref 1.4–7.0)
Neutrophils: 59 %
Platelets: 335 10*3/uL (ref 150–450)
RBC: 4.43 x10E6/uL (ref 3.77–5.28)
RDW: 15.5 % — ABNORMAL HIGH (ref 11.7–15.4)
WBC: 7.1 10*3/uL (ref 3.4–10.8)

## 2022-08-28 LAB — SEDIMENTATION RATE: Sed Rate: 29 mm/hr (ref 0–32)

## 2022-08-28 LAB — VITAMIN D 25 HYDROXY (VIT D DEFICIENCY, FRACTURES): Vit D, 25-Hydroxy: 29.1 ng/mL — ABNORMAL LOW (ref 30.0–100.0)

## 2022-08-28 LAB — LIPID PANEL
Chol/HDL Ratio: 4.3 ratio (ref 0.0–4.4)
Cholesterol, Total: 165 mg/dL (ref 100–199)
HDL: 38 mg/dL — ABNORMAL LOW (ref >39)
LDL Chol Calc (NIH): 98 mg/dL (ref 0–99)
Triglycerides: 167 mg/dL — ABNORMAL HIGH (ref 0–149)
VLDL Cholesterol Cal: 29 mg/dL (ref 5–40)

## 2022-08-28 LAB — HEMOGLOBIN A1C
Est. average glucose Bld gHb Est-mCnc: 126 mg/dL
Hgb A1c MFr Bld: 6 % — ABNORMAL HIGH (ref 4.8–5.6)

## 2022-08-28 LAB — C-REACTIVE PROTEIN: CRP: 3 mg/L (ref 0–10)

## 2022-09-01 ENCOUNTER — Ambulatory Visit: Payer: Self-pay | Admitting: Gerontology

## 2022-09-02 ENCOUNTER — Ambulatory Visit: Payer: Self-pay | Admitting: Gerontology

## 2022-09-02 ENCOUNTER — Other Ambulatory Visit: Payer: Self-pay

## 2022-09-02 DIAGNOSIS — R7303 Prediabetes: Secondary | ICD-10-CM | POA: Insufficient documentation

## 2022-09-02 DIAGNOSIS — G4700 Insomnia, unspecified: Secondary | ICD-10-CM

## 2022-09-02 MED ORDER — METFORMIN HCL ER 500 MG PO TB24
500.0000 mg | ORAL_TABLET | Freq: Every day | ORAL | 0 refills | Status: DC
Start: 2022-09-02 — End: 2022-09-23
  Filled 2022-09-02: qty 30, 30d supply, fill #0

## 2022-09-02 MED ORDER — MIRTAZAPINE 7.5 MG PO TABS
7.5000 mg | ORAL_TABLET | Freq: Every day | ORAL | 0 refills | Status: DC
Start: 2022-09-02 — End: 2022-09-23
  Filled 2022-09-02: qty 30, 30d supply, fill #0

## 2022-09-02 NOTE — Patient Instructions (Signed)
Calorie Counting for Weight Loss Calories are units of energy. Your body needs a certain number of calories from food to keep going throughout the day. When you eat or drink more calories than your body needs, your body stores the extra calories mostly as fat. When you eat or drink fewer calories than your body needs, your body burns fat to get the energy it needs. Calorie counting means keeping track of how many calories you eat and drink each day. Calorie counting can be helpful if you need to lose weight. If you eat fewer calories than your body needs, you should lose weight. Ask your health care provider what a healthy weight is for you. For calorie counting to work, you will need to eat the right number of calories each day to lose a healthy amount of weight per week. A dietitian can help you figure out how many calories you need in a day and will suggest ways to reach your calorie goal. A healthy amount of weight to lose each week is usually 1-2 lb (0.5-0.9 kg). This usually means that your daily calorie intake should be reduced by 500-750 calories. Eating 1,200-1,500 calories a day can help most women lose weight. Eating 1,500-1,800 calories a day can help most men lose weight. What do I need to know about calorie counting? Work with your health care provider or dietitian to determine how many calories you should get each day. To meet your daily calorie goal, you will need to: Find out how many calories are in each food that you would like to eat. Try to do this before you eat. Decide how much of the food you plan to eat. Keep a food log. Do this by writing down what you ate and how many calories it had. To successfully lose weight, it is important to balance calorie counting with a healthy lifestyle that includes regular activity. Where do I find calorie information?  The number of calories in a food can be found on a Nutrition Facts label. If a food does not have a Nutrition Facts label, try  to look up the calories online or ask your dietitian for help. Remember that calories are listed per serving. If you choose to have more than one serving of a food, you will have to multiply the calories per serving by the number of servings you plan to eat. For example, the label on a package of bread might say that a serving size is 1 slice and that there are 90 calories in a serving. If you eat 1 slice, you will have eaten 90 calories. If you eat 2 slices, you will have eaten 180 calories. How do I keep a food log? After each time that you eat, record the following in your food log as soon as possible: What you ate. Be sure to include toppings, sauces, and other extras on the food. How much you ate. This can be measured in cups, ounces, or number of items. How many calories were in each food and drink. The total number of calories in the food you ate. Keep your food log near you, such as in a pocket-sized notebook or on an app or website on your mobile phone. Some programs will calculate calories for you and show you how many calories you have left to meet your daily goal. What are some portion-control tips? Know how many calories are in a serving. This will help you know how many servings you can have of a certain   food. Use a measuring cup to measure serving sizes. You could also try weighing out portions on a kitchen scale. With time, you will be able to estimate serving sizes for some foods. Take time to put servings of different foods on your favorite plates or in your favorite bowls and cups so you know what a serving looks like. Try not to eat straight from a food's packaging, such as from a bag or box. Eating straight from the package makes it hard to see how much you are eating and can lead to overeating. Put the amount you would like to eat in a cup or on a plate to make sure you are eating the right portion. Use smaller plates, glasses, and bowls for smaller portions and to prevent  overeating. Try not to multitask. For example, avoid watching TV or using your computer while eating. If it is time to eat, sit down at a table and enjoy your food. This will help you recognize when you are full. It will also help you be more mindful of what and how much you are eating. What are tips for following this plan? Reading food labels Check the calorie count compared with the serving size. The serving size may be smaller than what you are used to eating. Check the source of the calories. Try to choose foods that are high in protein, fiber, and vitamins, and low in saturated fat, trans fat, and sodium. Shopping Read nutrition labels while you shop. This will help you make healthy decisions about which foods to buy. Pay attention to nutrition labels for low-fat or fat-free foods. These foods sometimes have the same number of calories or more calories than the full-fat versions. They also often have added sugar, starch, or salt to make up for flavor that was removed with the fat. Make a grocery list of lower-calorie foods and stick to it. Cooking Try to cook your favorite foods in a healthier way. For example, try baking instead of frying. Use low-fat dairy products. Meal planning Use more fruits and vegetables. One-half of your plate should be fruits and vegetables. Include lean proteins, such as chicken, turkey, and fish. Lifestyle Each week, aim to do one of the following: 150 minutes of moderate exercise, such as walking. 75 minutes of vigorous exercise, such as running. General information Know how many calories are in the foods you eat most often. This will help you calculate calorie counts faster. Find a way of tracking calories that works for you. Get creative. Try different apps or programs if writing down calories does not work for you. What foods should I eat?  Eat nutritious foods. It is better to have a nutritious, high-calorie food, such as an avocado, than a food with  few nutrients, such as a bag of potato chips. Use your calories on foods and drinks that will fill you up and will not leave you hungry soon after eating. Examples of foods that fill you up are nuts and nut butters, vegetables, lean proteins, and high-fiber foods such as whole grains. High-fiber foods are foods with more than 5 g of fiber per serving. Pay attention to calories in drinks. Low-calorie drinks include water and unsweetened drinks. The items listed above may not be a complete list of foods and beverages you can eat. Contact a dietitian for more information. What foods should I limit? Limit foods or drinks that are not good sources of vitamins, minerals, or protein or that are high in unhealthy fats. These   include: Candy. Other sweets. Sodas, specialty coffee drinks, alcohol, and juice. The items listed above may not be a complete list of foods and beverages you should avoid. Contact a dietitian for more information. How do I count calories when eating out? Pay attention to portions. Often, portions are much larger when eating out. Try these tips to keep portions smaller: Consider sharing a meal instead of getting your own. If you get your own meal, eat only half of it. Before you start eating, ask for a container and put half of your meal into it. When available, consider ordering smaller portions from the menu instead of full portions. Pay attention to your food and drink choices. Knowing the way food is cooked and what is included with the meal can help you eat fewer calories. If calories are listed on the menu, choose the lower-calorie options. Choose dishes that include vegetables, fruits, whole grains, low-fat dairy products, and lean proteins. Choose items that are boiled, broiled, grilled, or steamed. Avoid items that are buttered, battered, fried, or served with cream sauce. Items labeled as crispy are usually fried, unless stated otherwise. Choose water, low-fat milk,  unsweetened iced tea, or other drinks without added sugar. If you want an alcoholic beverage, choose a lower-calorie option, such as a glass of wine or light beer. Ask for dressings, sauces, and syrups on the side. These are usually high in calories, so you should limit the amount you eat. If you want a salad, choose a garden salad and ask for grilled meats. Avoid extra toppings such as bacon, cheese, or fried items. Ask for the dressing on the side, or ask for olive oil and vinegar or lemon to use as dressing. Estimate how many servings of a food you are given. Knowing serving sizes will help you be aware of how much food you are eating at restaurants. Where to find more information Centers for Disease Control and Prevention: www.cdc.gov [place]. Department of Agriculture: myplate.gov Summary Calorie counting means keeping track of how many calories you eat and drink each day. If you eat fewer calories than your body needs, you should lose weight. A healthy amount of weight to lose per week is usually 1-2 lb (0.5-0.9 kg). This usually means reducing your daily calorie intake by 500-750 calories. The number of calories in a food can be found on a Nutrition Facts label. If a food does not have a Nutrition Facts label, try to look up the calories online or ask your dietitian for help. Use smaller plates, glasses, and bowls for smaller portions and to prevent overeating. Use your calories on foods and drinks that will fill you up and not leave you hungry shortly after a meal. This information is not intended to replace advice given to you by your health care provider. Make sure you discuss any questions you have with your health care provider. Document Revised: 05/04/2019 Document Reviewed: 05/04/2019 Elsevier Patient Education  2023 Elsevier Inc.  

## 2022-09-02 NOTE — Progress Notes (Signed)
Established Patient Office Visit  Subjective   Patient ID: Melissa Dean, female    DOB: [birth year]  Age: 52 y.o. MRN: [id number]  No chief complaint on file.  Patient provided consent for telephone visit, 2 patient identifiers was used to identify patient.  HPI  Melissa Dean is a 52 year old female who has history of anemia, anxiety, asthma, bronchitis, COPD, depression presents for routine follow up visit for depression. She states that she's compliant with taking Mirtazapine 15 mg daily. During her last visit her PHQ-9 was 0 and GAD-7 was 0, but she gained more than 20 lbs in 6 months. She also c/o intermittent peripheral swelling and abdominal bloating. Her weight increased from 198 lbs in November 2023 to 229 lbs last week. She states that her mood is good, insomnia has improved, denies suicidal nor homicidal ideation.  Her labs was reviewed, HgbA1c was 6%, she states that she has been checking her fasting blood glucose and it was in the 130's mg/dl. She endorses strong family history of Type 2 diabetes. She denies hypo/hyperglycemic symptoms, peripheral neuropathy and performs daily foot checks. Overall, she states that she's doing well and offers no further complaint.  Review of Systems  Constitutional: Negative.   Respiratory: Negative.    Cardiovascular: Negative.   Genitourinary: Negative.   Endo/Heme/Allergies: Negative.   Psychiatric/Behavioral: Negative.        Objective:     There were no vitals taken for this visit. BP Readings from Last 3 Encounters:  08/27/22 136/83  03/03/22 124/83  10/28/21 134/79   Wt Readings from Last 3 Encounters:  08/27/22 229 lb 1.6 oz (103.9 kg)  03/03/22 198 lb (89.8 kg)  10/28/21 186 lb 14.4 oz (84.8 kg)      Physical Exam   No results found for any visits on 09/02/22.  Last CBC Lab Results  Component Value Date   WBC 7.1 08/27/2022   HGB 10.7 (L) 08/27/2022   HCT 34.1 08/27/2022   MCV 77 (L) 08/27/2022   MCH 24.2  (L) 08/27/2022   RDW 15.5 (H) 08/27/2022   PLT 335 08/27/2022   Last metabolic panel Lab Results  Component Value Date   GLUCOSE 139 (H) 08/27/2022   NA 139 08/27/2022   K 4.1 08/27/2022   CL 105 08/27/2022   CO2 21 08/27/2022   BUN 10 08/27/2022   CREATININE 0.75 08/27/2022   EGFR 98 08/27/2022   CALCIUM 9.4 08/27/2022   PROT 6.8 08/27/2022   ALBUMIN 4.2 08/27/2022   LABGLOB 2.6 08/27/2022   AGRATIO 1.6 08/27/2022   BILITOT 0.2 08/27/2022   ALKPHOS 104 08/27/2022   AST 14 08/27/2022   ALT 14 08/27/2022   ANIONGAP 7 08/26/2021   Last lipids Lab Results  Component Value Date   CHOL 165 08/27/2022   HDL 38 (L) 08/27/2022   LDLCALC 98 08/27/2022   TRIG 167 (H) 08/27/2022   CHOLHDL 4.3 08/27/2022   Last hemoglobin A1c Lab Results  Component Value Date   HGBA1C 6.0 (H) 08/27/2022   Last thyroid functions Lab Results  Component Value Date   TSH 6.090 (H) 12/11/2020   T4TOTAL 6.7 12/19/2020   Last vitamin D Lab Results  Component Value Date   VD25OH 29.1 (L) 08/27/2022      The 10-year ASCVD risk score (Arnett DK, et al., 2019) is: 1.6%    Assessment & Plan:   1. Insomnia, unspecified type - Patient's weight gain likely side effects of Remeron, Per consultation with Psychiatrist Dr. Mare Ferrari,  he advised decreasing Remeron's dose to 7.5 mg, or discontinuing Remeron, and starting her on Trazodone 100 mg at qhs. Patient agreed with decreasing Remeron's dose because she had worsening edema when she was taking Trazodone. She was advised to continue on calorie count, portion size and exercise as tolerated. - mirtazapine (REMERON) 7.5 MG tablet; Take 1 tablet (7.5 mg total) by mouth at bedtime.  Dispense: 30 tablet; Refill: 0  2. Prediabetes - Her HgbA1c was 6%, she was started on Metformin 500 mg daily, educated on medication side effects and advised to notify clinic. She was advised to continue on low carb/non concentrated sweet diet and exercise as tolerated. -  metFORMIN (GLUCOPHAGE-XR) 500 MG 24 hr tablet; Take 1 tablet (500 mg total) by mouth daily with breakfast.  Dispense: 30 tablet; Refill: 0   Return in about 27 days (around 09/29/2022), or if symptoms worsen or fail to improve.    Cody Oliger Trellis Paganini, NP

## 2022-09-09 ENCOUNTER — Other Ambulatory Visit: Payer: Self-pay

## 2022-09-14 ENCOUNTER — Other Ambulatory Visit: Payer: Self-pay | Admitting: Gerontology

## 2022-09-14 ENCOUNTER — Other Ambulatory Visit: Payer: Self-pay

## 2022-09-14 DIAGNOSIS — R7303 Prediabetes: Secondary | ICD-10-CM

## 2022-09-15 ENCOUNTER — Other Ambulatory Visit: Payer: Self-pay

## 2022-09-15 NOTE — Telephone Encounter (Signed)
Last filled 09/02/22. Requested refill too soon.

## 2022-09-23 ENCOUNTER — Other Ambulatory Visit: Payer: Self-pay

## 2022-09-23 ENCOUNTER — Other Ambulatory Visit: Payer: Self-pay | Admitting: Gerontology

## 2022-09-23 DIAGNOSIS — R7303 Prediabetes: Secondary | ICD-10-CM

## 2022-09-23 DIAGNOSIS — G4700 Insomnia, unspecified: Secondary | ICD-10-CM

## 2022-09-23 DIAGNOSIS — J449 Chronic obstructive pulmonary disease, unspecified: Secondary | ICD-10-CM

## 2022-09-23 MED ORDER — ALBUTEROL SULFATE HFA 108 (90 BASE) MCG/ACT IN AERS
2.0000 | INHALATION_SPRAY | Freq: Four times a day (QID) | RESPIRATORY_TRACT | 1 refills | Status: AC | PRN
Start: 2022-09-23 — End: ?
  Filled 2022-09-23: qty 6.7, 16d supply, fill #0

## 2022-09-23 MED ORDER — MIRTAZAPINE 7.5 MG PO TABS
7.5000 mg | ORAL_TABLET | Freq: Every day | ORAL | 0 refills | Status: AC
Start: 2022-09-23 — End: ?
  Filled 2022-09-23 (×2): qty 30, 30d supply, fill #0

## 2022-09-23 MED ORDER — METFORMIN HCL ER 500 MG PO TB24
500.0000 mg | ORAL_TABLET | Freq: Every day | ORAL | 0 refills | Status: AC
Start: 2022-09-23 — End: ?
  Filled 2022-09-23 (×2): qty 30, 30d supply, fill #0

## 2022-09-29 ENCOUNTER — Ambulatory Visit: Payer: Self-pay | Admitting: Gerontology

## 2022-10-07 ENCOUNTER — Ambulatory Visit: Payer: Self-pay | Admitting: Gerontology

## 2022-10-14 ENCOUNTER — Ambulatory Visit: Payer: Self-pay | Admitting: Gerontology

## 2022-10-29 ENCOUNTER — Other Ambulatory Visit: Payer: Self-pay

## 2022-10-29 DIAGNOSIS — Z1231 Encounter for screening mammogram for malignant neoplasm of breast: Secondary | ICD-10-CM

## 2022-11-02 ENCOUNTER — Other Ambulatory Visit: Payer: Self-pay

## 2022-11-02 ENCOUNTER — Ambulatory Visit
Admission: RE | Admit: 2022-11-02 | Discharge: 2022-11-02 | Disposition: A | Discharge status: Home / Self Care | Payer: Self-pay | Source: Ambulatory Visit | Attending: Obstetrics and Gynecology | Admitting: Obstetrics and Gynecology

## 2022-11-02 ENCOUNTER — Ambulatory Visit: Payer: Self-pay | Attending: Hematology and Oncology | Admitting: Hematology and Oncology

## 2022-11-02 VITALS — BP 137/79 | Wt 219.5 lb

## 2022-11-02 DIAGNOSIS — Z1231 Encounter for screening mammogram for malignant neoplasm of breast: Secondary | ICD-10-CM

## 2022-11-02 DIAGNOSIS — Z1211 Encounter for screening for malignant neoplasm of colon: Secondary | ICD-10-CM

## 2022-11-02 NOTE — Patient Instructions (Signed)
Taught Melissa Dean about self breast awareness and gave educational materials to take home. Patient did not need a Pap smear today due to last Pap smear was in 12/2021 per patient. Let her know [facility] will cover Pap smears every 5 years unless has a history of abnormal Pap smears. Referred patient to the [facility] for screening mammogram. Appointment scheduled for 11/02/22. Patient aware of appointment and will be there. Let patient know will follow up with her within the next couple weeks with results. Melissa Dean verbalized understanding.  Pascal Lux, NP 9:35 AM

## 2022-11-02 NOTE — Progress Notes (Signed)
Ms. Melissa Dean is a 52 y.o. female who presents to [facility] clinic today with no complaints.    Pap Smear: Pap not smear completed today. Last Pap smear was 12/2021 at [facility] clinic and was normal. Per patient has no history of an abnormal Pap smear. Last Pap smear result is not available in Epic.   Physical exam: Breasts Breasts symmetrical. No skin abnormalities bilateral breasts. No nipple retraction bilateral breasts. No nipple discharge bilateral breasts. No lymphadenopathy. No lumps palpated bilateral breasts.  MS DIGITAL SCREENING TOMO BILATERAL  Result Date: 10/29/2021 CLINICAL DATA:  Screening. EXAM: DIGITAL SCREENING BILATERAL MAMMOGRAM WITH TOMOSYNTHESIS AND CAD TECHNIQUE: Bilateral screening digital craniocaudal and mediolateral oblique mammograms were obtained. Bilateral screening digital breast tomosynthesis was performed. The images were evaluated with computer-aided detection. COMPARISON:  Previous exam(s). ACR Breast Density Category c: The breast tissue is heterogeneously dense, which may obscure small masses. FINDINGS: There are no findings suspicious for malignancy. IMPRESSION: No mammographic evidence of malignancy. A result letter of this screening mammogram will be mailed directly to the patient. RECOMMENDATION: Screening mammogram in one year. (Code:SM-B-01Y) BI-RADS CATEGORY  1: Negative. Electronically Signed   By: Edwin Cap M.D.   On: 10/29/2021 14:57   MS DIGITAL SCREENING TOMO BILATERAL  Result Date: 03/22/2020 CLINICAL DATA:  Screening. EXAM: DIGITAL SCREENING BILATERAL MAMMOGRAM WITH TOMO AND CAD COMPARISON:  Previous exam(s). ACR Breast Density Category c: The breast tissue is heterogeneously dense, which may obscure small masses. FINDINGS: There are no findings suspicious for malignancy. Images were processed with CAD. IMPRESSION: No mammographic evidence of malignancy. A result letter of this screening mammogram will be mailed directly to the patient.  RECOMMENDATION: Screening mammogram in one year. (Code:SM-B-01Y) BI-RADS CATEGORY  1: Negative. Electronically Signed   By: Frederico Hamman M.D.   On: 03/22/2020 15:44   MS DIGITAL SCREENING TOMO BILATERAL  Result Date: 03/21/2019 CLINICAL DATA:  Screening. EXAM: DIGITAL SCREENING BILATERAL MAMMOGRAM WITH TOMO AND CAD COMPARISON:  Previous exam(s). ACR Breast Density Category c: The breast tissue is heterogeneously dense, which may obscure small masses. FINDINGS: There are no findings suspicious for malignancy. Images were processed with CAD. IMPRESSION: No mammographic evidence of malignancy. A result letter of this screening mammogram will be mailed directly to the patient. RECOMMENDATION: Screening mammogram in one year. (Code:SM-B-01Y) BI-RADS CATEGORY  1: Negative. Electronically Signed   By: Frederico Hamman M.D.   On: 03/21/2019 13:03         Pelvic/Bimanual Pap is not indicated today    Smoking History: Patient is a former smoker who quit in 2021 and was not referred to quit line.    Patient Navigation: Patient education provided. Access to services provided for patient through [facility] program. No interpreter provided. No transportation provided   Colorectal Cancer Screening: Per patient has never had colonoscopy completed No complaints today. FIT test given.    Breast and Cervical Cancer Risk Assessment: Patient does not have family history of breast cancer, known genetic mutations, or radiation treatment to the chest before age 24. Patient does not have history of cervical dysplasia, immunocompromised, or DES exposure in-utero.  Risk Scores as of Encounter on 11/02/2022     Melissa Dean           5-year 1.15%   Lifetime 9.91%            Last calculated by Narda Rutherford, LPN on 1/61/0960 at  9:54 AM        A: [facility] exam without pap smear  No complaints with benign exam.   P: Referred patient to the [facility] for a screening mammogram. Appointment  scheduled 11/02/2022.  Pascal Lux, NP 11/02/2022 9:56 AM

## 2022-11-26 ENCOUNTER — Ambulatory Visit: Payer: Self-pay | Admitting: Gerontology

## 2023-10-06 ENCOUNTER — Telehealth: Payer: Self-pay | Admitting: *Deleted

## 2023-10-20 ENCOUNTER — Other Ambulatory Visit: Payer: Self-pay | Admitting: Internal Medicine

## 2023-10-20 DIAGNOSIS — Z1231 Encounter for screening mammogram for malignant neoplasm of breast: Secondary | ICD-10-CM

## 2023-11-04 ENCOUNTER — Ambulatory Visit
Admission: RE | Admit: 2023-11-04 | Discharge: 2023-11-04 | Disposition: A | Discharge status: Home / Self Care | Payer: Self-pay | Source: Ambulatory Visit | Attending: Internal Medicine | Admitting: Internal Medicine

## 2023-11-04 ENCOUNTER — Other Ambulatory Visit: Payer: Self-pay

## 2023-11-04 DIAGNOSIS — Z1231 Encounter for screening mammogram for malignant neoplasm of breast: Secondary | ICD-10-CM | POA: Insufficient documentation

## 2023-11-04 DIAGNOSIS — R2231 Localized swelling, mass and lump, right upper limb: Secondary | ICD-10-CM

## 2023-11-09 ENCOUNTER — Ambulatory Visit: Payer: Self-pay | Attending: Obstetrics and Gynecology | Admitting: *Deleted

## 2023-11-09 ENCOUNTER — Inpatient Hospital Stay
Admission: RE | Admit: 2023-11-09 | Discharge: 2023-11-09 | Discharge status: Home / Self Care | Payer: Self-pay | Source: Ambulatory Visit | Attending: Obstetrics and Gynecology | Admitting: Obstetrics and Gynecology

## 2023-11-09 ENCOUNTER — Ambulatory Visit
Admission: RE | Admit: 2023-11-09 | Discharge: 2023-11-09 | Disposition: A | Discharge status: Home / Self Care | Payer: Self-pay | Source: Ambulatory Visit | Attending: Obstetrics and Gynecology | Admitting: Obstetrics and Gynecology

## 2023-11-09 ENCOUNTER — Other Ambulatory Visit: Payer: Self-pay | Admitting: Obstetrics and Gynecology

## 2023-11-09 VITALS — BP 127/69 | Ht 62.0 in | Wt 178.8 lb

## 2023-11-09 DIAGNOSIS — Z1239 Encounter for other screening for malignant neoplasm of breast: Secondary | ICD-10-CM

## 2023-11-09 DIAGNOSIS — R2231 Localized swelling, mass and lump, right upper limb: Secondary | ICD-10-CM

## 2023-11-09 NOTE — Patient Instructions (Addendum)
 Explained breast self awareness with Berwyn Gowda. Patient did not need a Pap smear today due to last Pap smear and HPV typing was 12/18/2022. Let her know [facility] will cover Pap smears and HPV typing every 5 years unless has a history of abnormal Pap smears. Referred patient to the [facility] for a diagnostic mammogram. Appointment scheduled Tuesday, November 09, 2023 at 1520. Patient aware of appointment and will be there. Berwyn Gowda verbalized understanding.  Rasean Joos, Wanda Ship, RN 2:34 PM

## 2023-11-09 NOTE — Progress Notes (Signed)
 Ms. Melissa Dean is a 53 y.o. female who presents to [facility] clinic today with complaint of right axillary lump x 3 months.    Pap Smear: Pap smear not completed today. Last Pap smear was 12/18/2022 at [facility] clinic and was normal with negative HPV. Per patient has no history of an abnormal Pap smear. Last Pap smear result is available in Epic.   Physical exam: Breasts Breasts symmetrical. No skin abnormalities bilateral breasts. No nipple retraction bilateral breasts. No nipple discharge bilateral breasts. No lymphadenopathy. No lumps palpated left breast. Palpated a lump within the right axilla at 10 o'clock 11 cm from the nipple. No complaints of pain or tenderness on exam.  MS 3D SCR MAMMO BILAT BR (aka MM) Result Date: 11/03/2022 CLINICAL DATA:  Screening. EXAM: DIGITAL SCREENING BILATERAL MAMMOGRAM WITH TOMOSYNTHESIS AND CAD TECHNIQUE: Bilateral screening digital craniocaudal and mediolateral oblique mammograms were obtained. Bilateral screening digital breast tomosynthesis was performed. The images were evaluated with computer-aided detection. COMPARISON:  Previous exam(s). ACR Breast Density Category c: The breasts are heterogeneously dense, which may obscure small masses. FINDINGS: There are no findings suspicious for malignancy. IMPRESSION: No mammographic evidence of malignancy. A result letter of this screening mammogram will be mailed directly to the patient. RECOMMENDATION: Screening mammogram in one year. (Code:SM-B-01Y) BI-RADS CATEGORY  1: Negative. Electronically Signed   By: Serena  Chacko M.D.   On: 11/03/2022 12:44   MS DIGITAL SCREENING TOMO BILATERAL Result Date: 10/29/2021 CLINICAL DATA:  Screening. EXAM: DIGITAL SCREENING BILATERAL MAMMOGRAM WITH TOMOSYNTHESIS AND CAD TECHNIQUE: Bilateral screening digital craniocaudal and mediolateral oblique mammograms were obtained. Bilateral screening digital breast tomosynthesis was performed. The images were evaluated with  computer-aided detection. COMPARISON:  Previous exam(s). ACR Breast Density Category c: The breast tissue is heterogeneously dense, which may obscure small masses. FINDINGS: There are no findings suspicious for malignancy. IMPRESSION: No mammographic evidence of malignancy. A result letter of this screening mammogram will be mailed directly to the patient. RECOMMENDATION: Screening mammogram in one year. (Code:SM-B-01Y) BI-RADS CATEGORY  1: Negative. Electronically Signed   By: Delon Music M.D.   On: 10/29/2021 14:57   MS DIGITAL SCREENING TOMO BILATERAL Result Date: 03/22/2020 CLINICAL DATA:  Screening. EXAM: DIGITAL SCREENING BILATERAL MAMMOGRAM WITH TOMO AND CAD COMPARISON:  Previous exam(s). ACR Breast Density Category c: The breast tissue is heterogeneously dense, which may obscure small masses. FINDINGS: There are no findings suspicious for malignancy. Images were processed with CAD. IMPRESSION: No mammographic evidence of malignancy. A result letter of this screening mammogram will be mailed directly to the patient. RECOMMENDATION: Screening mammogram in one year. (Code:SM-B-01Y) BI-RADS CATEGORY  1: Negative. Electronically Signed   By: Rosaline Collet M.D.   On: 03/22/2020 15:44   MS DIGITAL SCREENING TOMO BILATERAL Result Date: 03/21/2019 CLINICAL DATA:  Screening. EXAM: DIGITAL SCREENING BILATERAL MAMMOGRAM WITH TOMO AND CAD COMPARISON:  Previous exam(s). ACR Breast Density Category c: The breast tissue is heterogeneously dense, which may obscure small masses. FINDINGS: There are no findings suspicious for malignancy. Images were processed with CAD. IMPRESSION: No mammographic evidence of malignancy. A result letter of this screening mammogram will be mailed directly to the patient. RECOMMENDATION: Screening mammogram in one year. (Code:SM-B-01Y) BI-RADS CATEGORY  1: Negative. Electronically Signed   By: Rosaline Collet M.D.   On: 03/21/2019 13:03      Pelvic/Bimanual Pap is not  indicated today per BCCCP guidelines.   Smoking History: Patient is a former smoker that quit 02/06/2020.   Patient Navigation:  Patient education provided. Access to services provided for patient through [facility] program.   Colorectal Cancer Screening: Per patient has had colonoscopy completed on 06/07/2023 at [facility]. No complaints today.    Breast and Cervical Cancer Risk Assessment: Patient does not have family history of breast cancer, known genetic mutations, or radiation treatment to the chest before age 64. Patient does not have history of cervical dysplasia, immunocompromised, or DES exposure in-utero.  Risk Scores as of Encounter on 11/09/2023     [name]           5-year 1.04%   Lifetime 9.65%            Last calculated by [name], LPN on 04/09/7972 at  2:31 PM        A: BCCCP exam without pap smear Complaint of right axillary lump.  P: Referred patient to the [facility] for a diagnostic mammogram. Appointment scheduled Tuesday, November 09, 2023 at 1520.  Driscilla Wanda SQUIBB, RN 11/09/2023 2:35 PM
